# Patient Record
Sex: Male | Born: 1955 | Race: White | Hispanic: No | Smoking: Never smoker
Health system: Southern US, Community
[De-identification: ages and names within clinical notes are randomized; demographics above are authoritative.]

## PROBLEM LIST (undated history)

## (undated) DIAGNOSIS — E785 Hyperlipidemia, unspecified: Secondary | ICD-10-CM

## (undated) DIAGNOSIS — C439 Malignant melanoma of skin, unspecified: Secondary | ICD-10-CM

## (undated) DIAGNOSIS — C61 Malignant neoplasm of prostate: Secondary | ICD-10-CM

## (undated) DIAGNOSIS — I447 Left bundle-branch block, unspecified: Secondary | ICD-10-CM

## (undated) DIAGNOSIS — I10 Essential (primary) hypertension: Secondary | ICD-10-CM

## (undated) DIAGNOSIS — C799 Secondary malignant neoplasm of unspecified site: Secondary | ICD-10-CM

## (undated) DIAGNOSIS — I619 Nontraumatic intracerebral hemorrhage, unspecified: Secondary | ICD-10-CM

## (undated) DIAGNOSIS — I509 Heart failure, unspecified: Secondary | ICD-10-CM

## (undated) HISTORY — DX: Nontraumatic intracerebral hemorrhage, unspecified: I61.9

## (undated) HISTORY — PX: APPENDECTOMY: SHX54

## (undated) HISTORY — PX: PROSTATECTOMY: SHX69

## (undated) HISTORY — DX: Essential (primary) hypertension: I10

## (undated) HISTORY — DX: Heart failure, unspecified: I50.9

## (undated) HISTORY — PX: CHOLECYSTECTOMY: SHX55

## (undated) HISTORY — DX: Hyperlipidemia, unspecified: E78.5

## (undated) HISTORY — DX: Left bundle-branch block, unspecified: I44.7

---

## 1998-10-26 ENCOUNTER — Emergency Department (HOSPITAL_COMMUNITY): Admission: EM | Admit: 1998-10-26 | Discharge: 1998-10-26 | Payer: Self-pay | Admitting: *Deleted

## 1998-10-26 ENCOUNTER — Encounter: Payer: Self-pay | Admitting: *Deleted

## 1999-04-29 ENCOUNTER — Inpatient Hospital Stay (HOSPITAL_COMMUNITY): Admission: EM | Admit: 1999-04-29 | Discharge: 1999-05-01 | Payer: Self-pay | Admitting: Emergency Medicine

## 1999-04-29 ENCOUNTER — Encounter: Payer: Self-pay | Admitting: Emergency Medicine

## 1999-05-05 ENCOUNTER — Ambulatory Visit (HOSPITAL_COMMUNITY): Admission: RE | Admit: 1999-05-05 | Discharge: 1999-05-06 | Payer: Self-pay | Admitting: Surgery

## 1999-05-05 ENCOUNTER — Encounter: Payer: Self-pay | Admitting: Surgery

## 2011-12-28 ENCOUNTER — Ambulatory Visit (HOSPITAL_BASED_OUTPATIENT_CLINIC_OR_DEPARTMENT_OTHER)
Admission: RE | Admit: 2011-12-28 | Discharge: 2011-12-28 | Disposition: A | Discharge status: Home / Self Care | Payer: Self-pay | Source: Ambulatory Visit | Attending: Occupational Medicine | Admitting: Occupational Medicine

## 2011-12-28 ENCOUNTER — Other Ambulatory Visit: Payer: Self-pay | Admitting: Occupational Medicine

## 2011-12-28 DIAGNOSIS — Z0000 Encounter for general adult medical examination without abnormal findings: Secondary | ICD-10-CM | POA: Insufficient documentation

## 2011-12-28 IMAGING — CR DG CHEST 1V
1 series · 1 of 1 positions shown · non-contrast
Comparison: Chest x-ray report [REDACTED]  no images available

CLINICAL DATA: Annual physical exam

CHEST - 1 VIEW

[w chest pa]
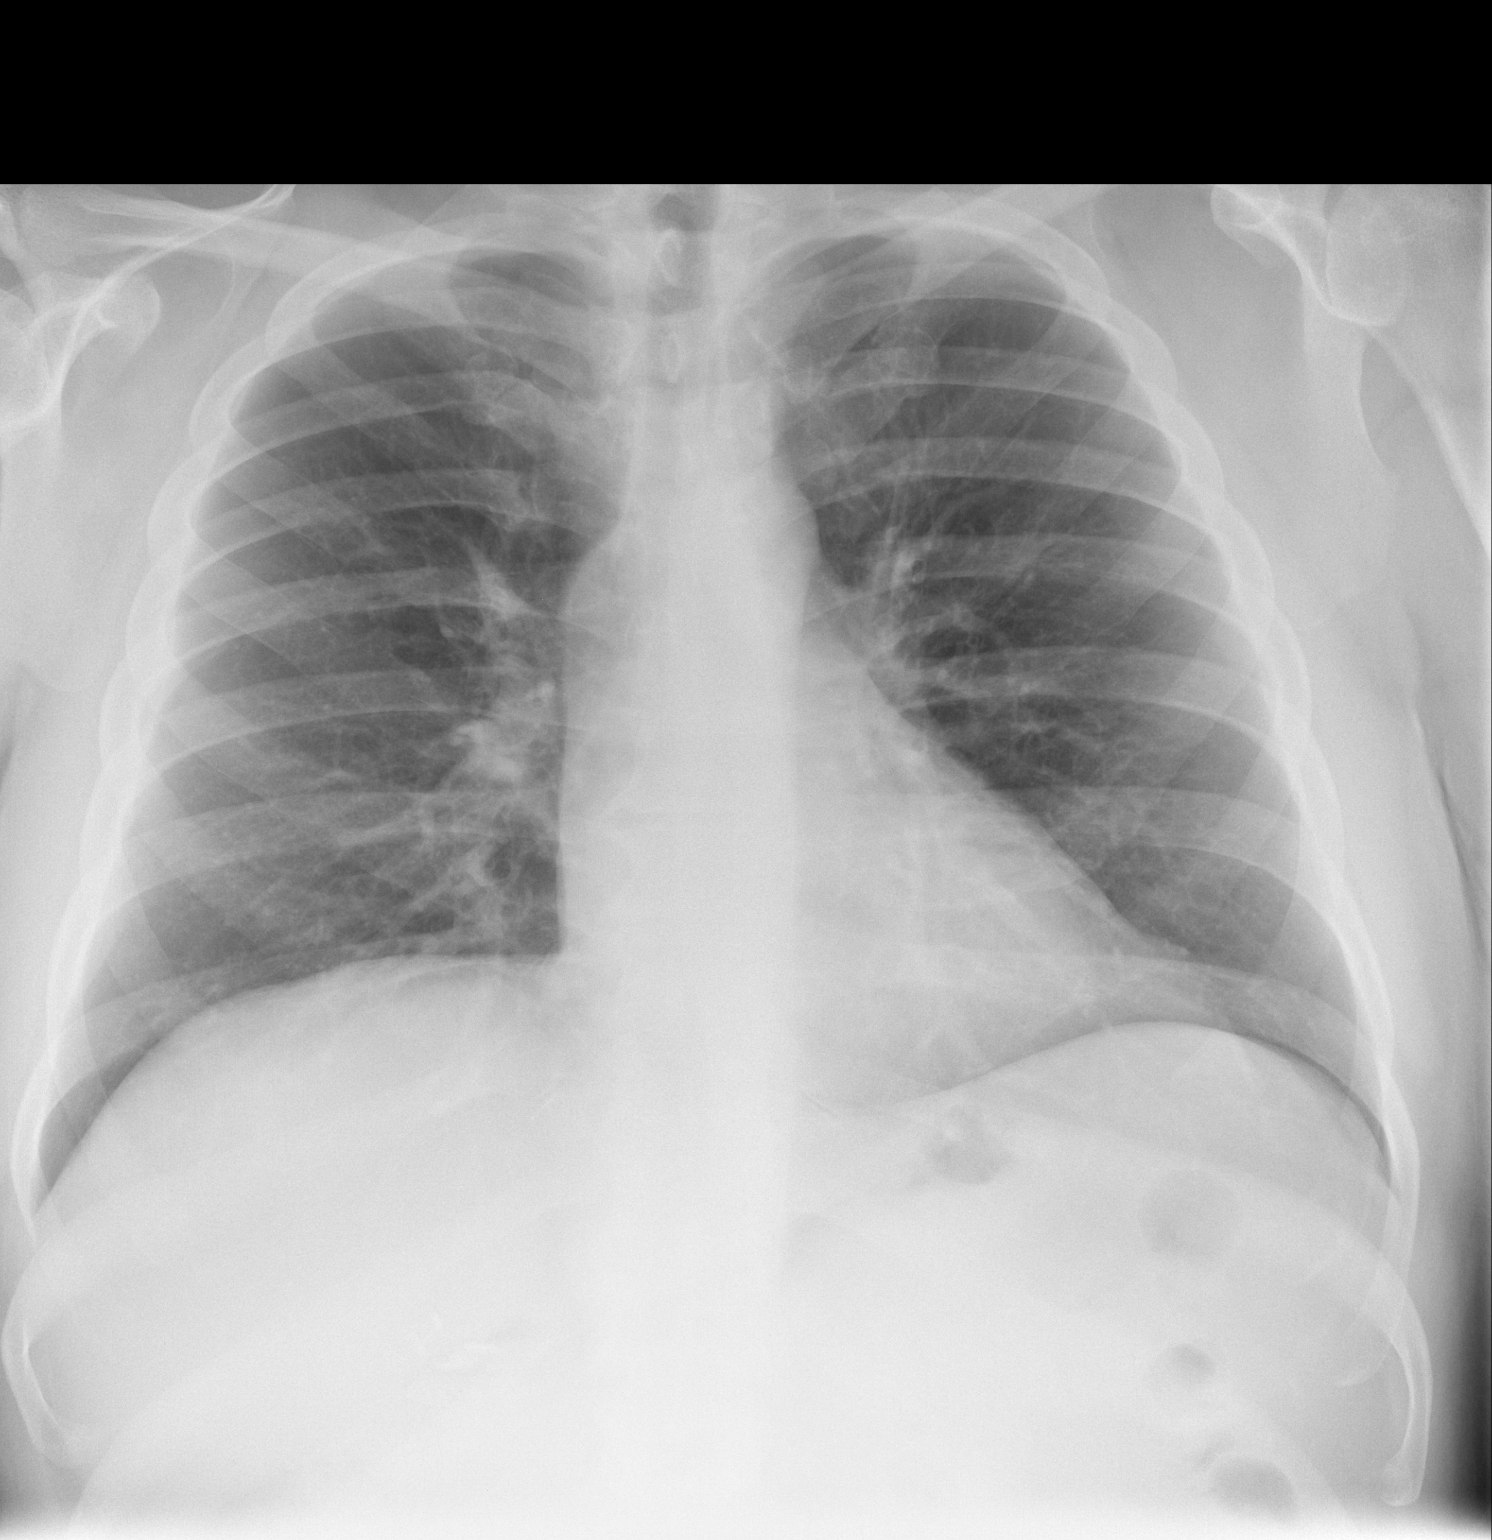

[1 of 1 positions shown; findings below may reference images not displayed]

FINDINGS: Cardiomediastinal silhouette is unremarkable.  Mild
elevation of the right hemidiaphragm.  No acute infiltrate or
pleural effusion.  No pulmonary edema. Bony thorax is unremarkable.
IMPRESSION: No active disease.

## 2019-08-05 ENCOUNTER — Encounter (HOSPITAL_BASED_OUTPATIENT_CLINIC_OR_DEPARTMENT_OTHER): Payer: Self-pay | Admitting: *Deleted

## 2019-08-05 ENCOUNTER — Emergency Department (HOSPITAL_BASED_OUTPATIENT_CLINIC_OR_DEPARTMENT_OTHER): Payer: Managed Care, Other (non HMO)

## 2019-08-05 ENCOUNTER — Emergency Department (HOSPITAL_BASED_OUTPATIENT_CLINIC_OR_DEPARTMENT_OTHER)
Admission: EM | Admit: 2019-08-05 | Discharge: 2019-08-06 | Disposition: A | Discharge status: Home / Self Care | Payer: Managed Care, Other (non HMO) | Source: Home / Self Care | Attending: Emergency Medicine | Admitting: Emergency Medicine

## 2019-08-05 ENCOUNTER — Other Ambulatory Visit: Payer: Self-pay

## 2019-08-05 DIAGNOSIS — R748 Abnormal levels of other serum enzymes: Secondary | ICD-10-CM

## 2019-08-05 DIAGNOSIS — R7989 Other specified abnormal findings of blood chemistry: Secondary | ICD-10-CM

## 2019-08-05 DIAGNOSIS — E860 Dehydration: Secondary | ICD-10-CM

## 2019-08-05 DIAGNOSIS — Z8546 Personal history of malignant neoplasm of prostate: Secondary | ICD-10-CM | POA: Insufficient documentation

## 2019-08-05 DIAGNOSIS — R109 Unspecified abdominal pain: Secondary | ICD-10-CM | POA: Insufficient documentation

## 2019-08-05 DIAGNOSIS — E876 Hypokalemia: Secondary | ICD-10-CM | POA: Insufficient documentation

## 2019-08-05 DIAGNOSIS — R945 Abnormal results of liver function studies: Secondary | ICD-10-CM | POA: Insufficient documentation

## 2019-08-05 DIAGNOSIS — R5383 Other fatigue: Secondary | ICD-10-CM | POA: Insufficient documentation

## 2019-08-05 DIAGNOSIS — R197 Diarrhea, unspecified: Secondary | ICD-10-CM

## 2019-08-05 HISTORY — DX: Malignant neoplasm of prostate: C61

## 2019-08-05 LAB — CBC WITH DIFFERENTIAL/PLATELET
Abs Immature Granulocytes: 0.05 10*3/uL (ref 0.00–0.07)
Basophils Absolute: 0 10*3/uL (ref 0.0–0.1)
Basophils Relative: 0 %
Eosinophils Absolute: 0.1 10*3/uL (ref 0.0–0.5)
Eosinophils Relative: 1 %
HCT: 48.3 % (ref 39.0–52.0)
Hemoglobin: 16.3 g/dL (ref 13.0–17.0)
Immature Granulocytes: 1 %
Lymphocytes Relative: 22 %
Lymphs Abs: 1.5 10*3/uL (ref 0.7–4.0)
MCH: 31 pg (ref 26.0–34.0)
MCHC: 33.7 g/dL (ref 30.0–36.0)
MCV: 91.8 fL (ref 80.0–100.0)
Monocytes Absolute: 0.7 10*3/uL (ref 0.1–1.0)
Monocytes Relative: 10 %
Neutro Abs: 4.5 10*3/uL (ref 1.7–7.7)
Neutrophils Relative %: 66 %
Platelets: 247 10*3/uL (ref 150–400)
RBC: 5.26 MIL/uL (ref 4.22–5.81)
RDW: 11.9 % (ref 11.5–15.5)
WBC: 6.8 10*3/uL (ref 4.0–10.5)
nRBC: 0 % (ref 0.0–0.2)

## 2019-08-05 LAB — URINALYSIS, ROUTINE W REFLEX MICROSCOPIC
Bilirubin Urine: NEGATIVE
Glucose, UA: NEGATIVE mg/dL
Ketones, ur: NEGATIVE mg/dL
Leukocytes,Ua: NEGATIVE
Nitrite: NEGATIVE
Protein, ur: 30 mg/dL — AB
Specific Gravity, Urine: 1.015 (ref 1.005–1.030)
pH: 6 (ref 5.0–8.0)

## 2019-08-05 LAB — COMPREHENSIVE METABOLIC PANEL
ALT: 210 U/L — ABNORMAL HIGH (ref 0–44)
AST: 119 U/L — ABNORMAL HIGH (ref 15–41)
Albumin: 3.2 g/dL — ABNORMAL LOW (ref 3.5–5.0)
Alkaline Phosphatase: 138 U/L — ABNORMAL HIGH (ref 38–126)
Anion gap: 11 (ref 5–15)
BUN: 34 mg/dL — ABNORMAL HIGH (ref 8–23)
CO2: 22 mmol/L (ref 22–32)
Calcium: 8.4 mg/dL — ABNORMAL LOW (ref 8.9–10.3)
Chloride: 104 mmol/L (ref 98–111)
Creatinine, Ser: 1.74 mg/dL — ABNORMAL HIGH (ref 0.61–1.24)
GFR calc Af Amer: 47 mL/min — ABNORMAL LOW (ref >60)
GFR calc non Af Amer: 41 mL/min — ABNORMAL LOW (ref >60)
Glucose, Bld: 129 mg/dL — ABNORMAL HIGH (ref 70–99)
Potassium: 3.3 mmol/L — ABNORMAL LOW (ref 3.5–5.1)
Sodium: 137 mmol/L (ref 135–145)
Total Bilirubin: 0.5 mg/dL (ref 0.3–1.2)
Total Protein: 6.8 g/dL (ref 6.5–8.1)

## 2019-08-05 LAB — URINALYSIS, MICROSCOPIC (REFLEX)

## 2019-08-05 LAB — LIPASE, BLOOD: Lipase: 186 U/L — ABNORMAL HIGH (ref 11–51)

## 2019-08-05 IMAGING — CT CT ABD-PELV W/ CM
2 of 5 series · 16 of 46 positions shown, 18 images · IV contrast (Omnipaque)
Comparison: None.

CLINICAL DATA: Diarrhea. Elevated liver enzymes and pancreatic
enzymes.

EXAM:
CT ABDOMEN AND PELVIS WITH CONTRAST
TECHNIQUE: Multidetector CT imaging of the abdomen and pelvis was performed
using the standard protocol following bolus administration of
intravenous contrast.
CONTRAST:  80mL OMNIPAQUE IOHEXOL 300 MG/ML  SOLN

[Series 2: axial st · axial · 0.94mm/px · z∈[-536,-76]mm · 13 of 104 slices shown, 15 images]
[im 6/104  soft-tissue]
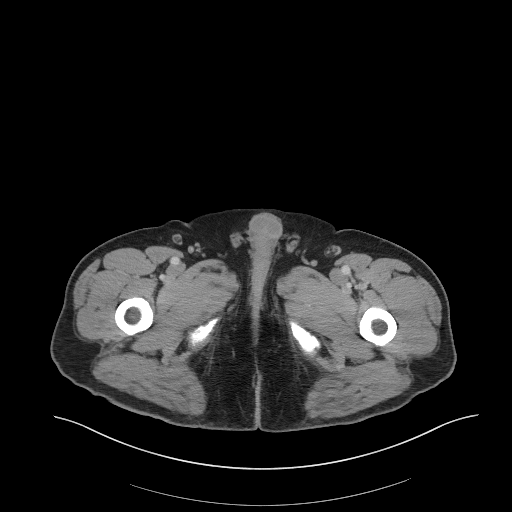
[im 6/104  bone]
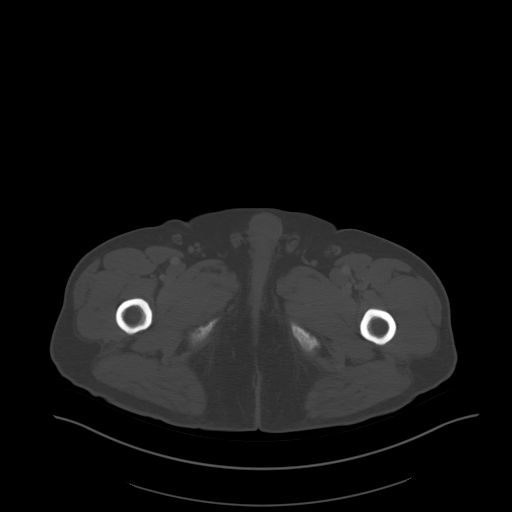
[im 17/104  soft-tissue]
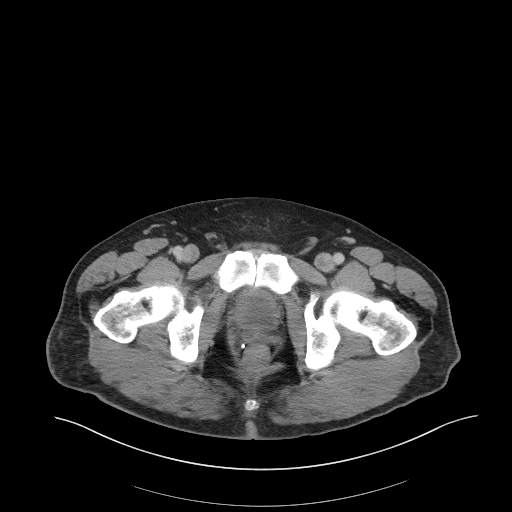
[im 22/104  soft-tissue]
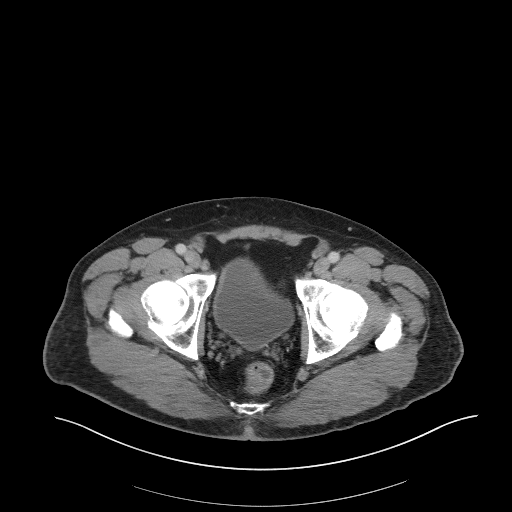
[im 28/104  soft-tissue]
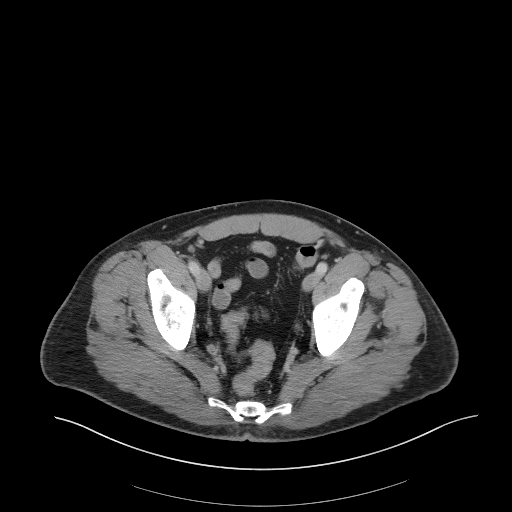
[im 38/104  soft-tissue]
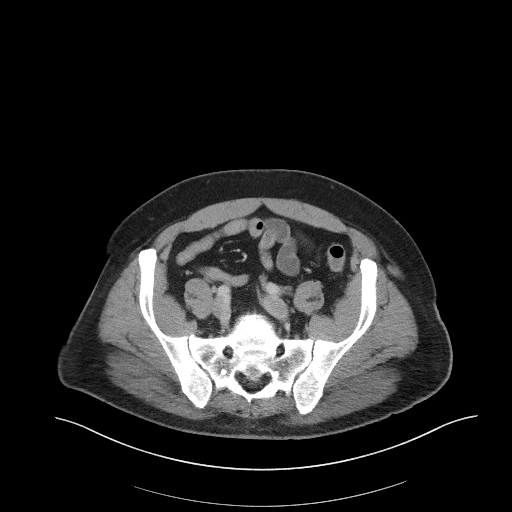
[im 44/104  soft-tissue]
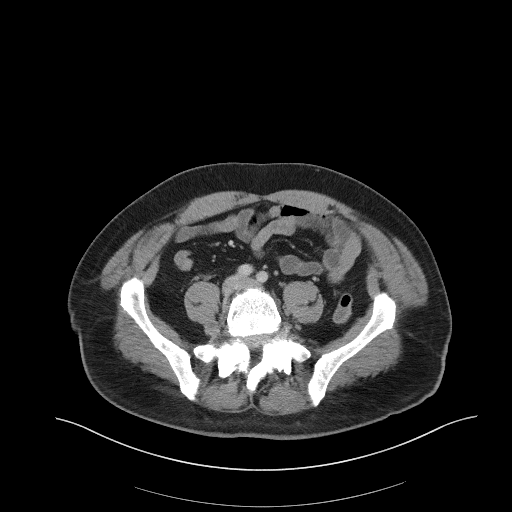
[im 55/104  soft-tissue]
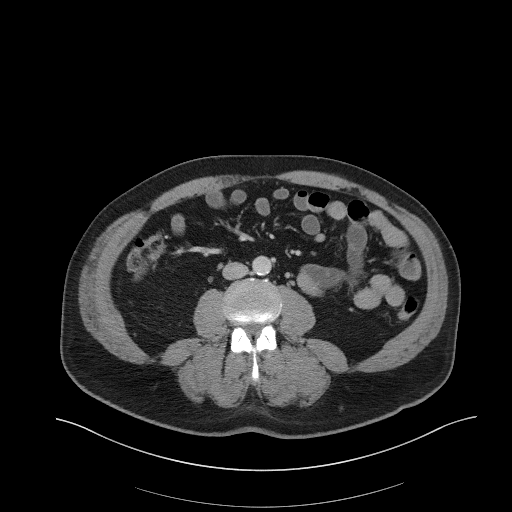
[im 60/104  soft-tissue]
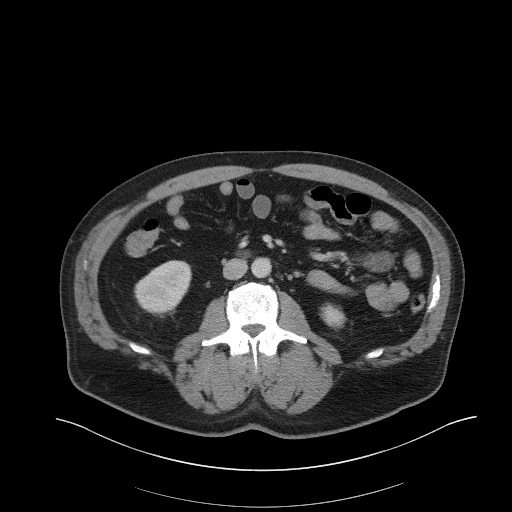
[im 66/104  soft-tissue]
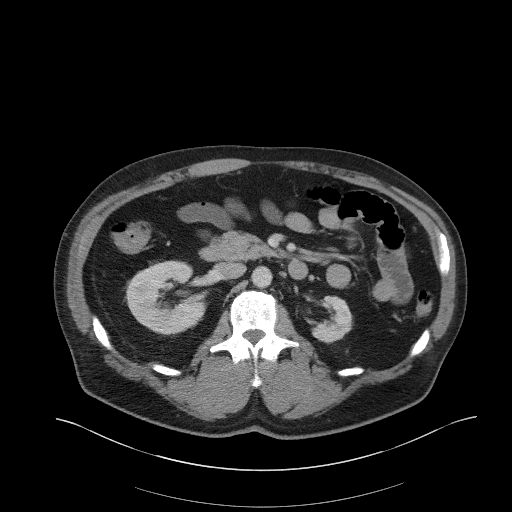
[im 66/104  bone]
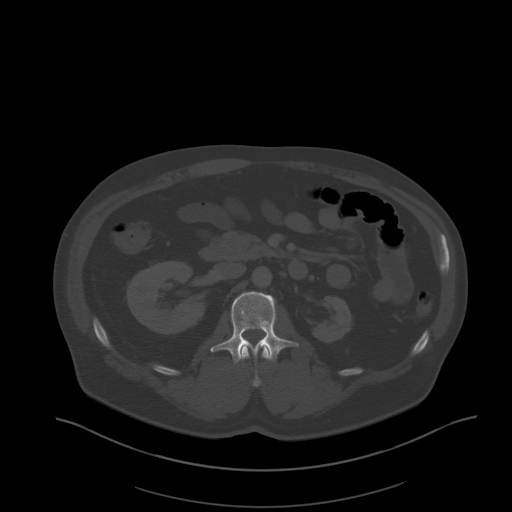
[im 76/104  soft-tissue]
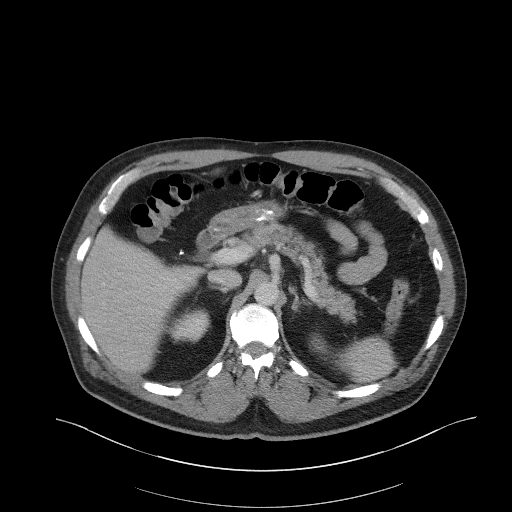
[im 82/104  soft-tissue]
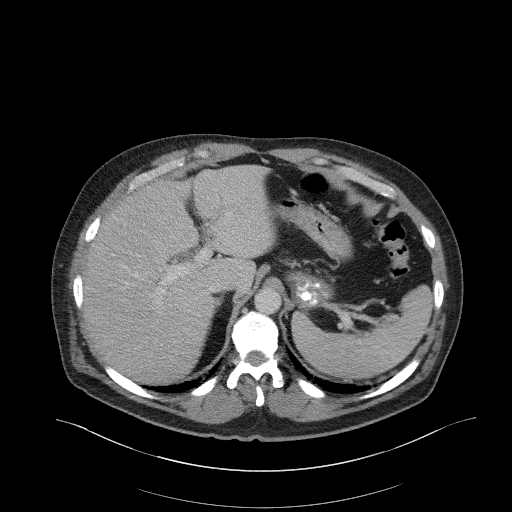
[im 87/104  soft-tissue]
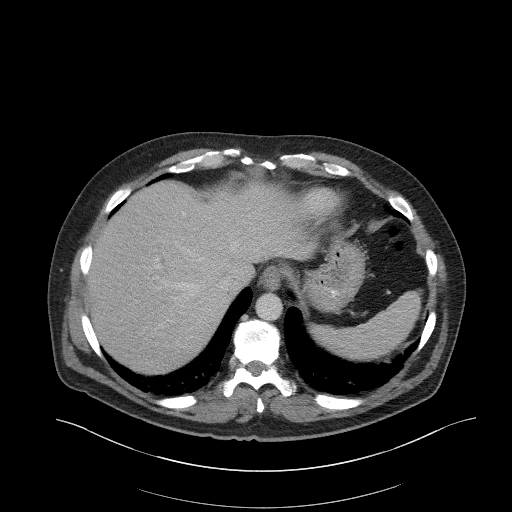
[im 98/104  soft-tissue]
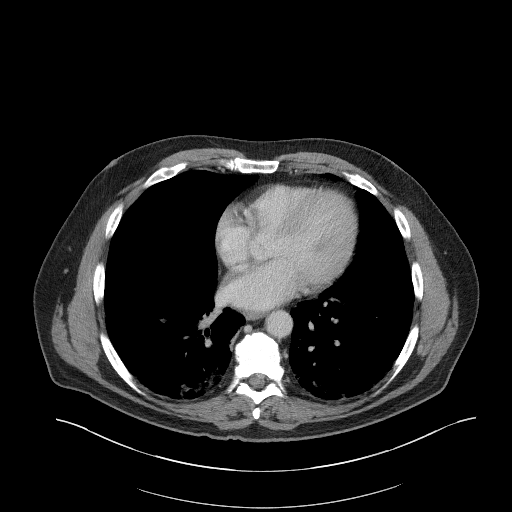

[Series 5: coronal st · coronal · 0.88mm/px · 3 of 101 slices shown]
[im 34/101  soft-tissue]
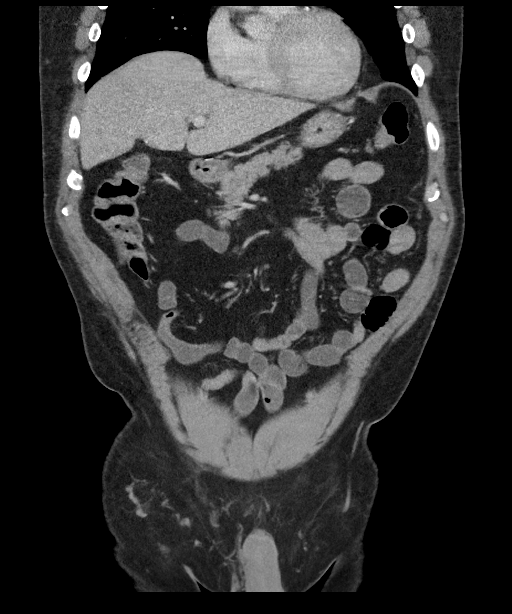
[im 45/101  soft-tissue]
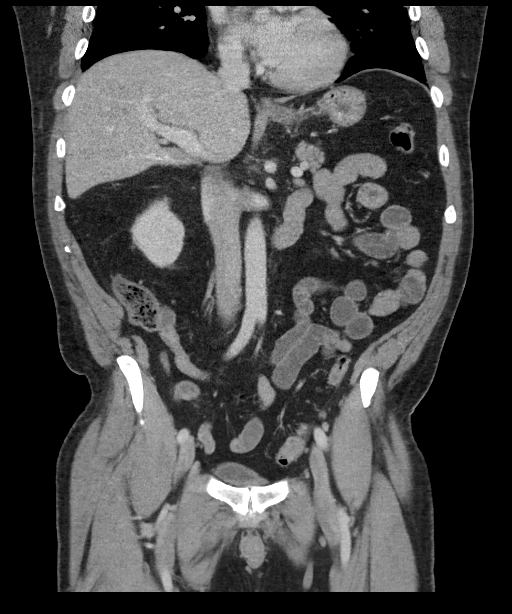
[im 56/101  soft-tissue]
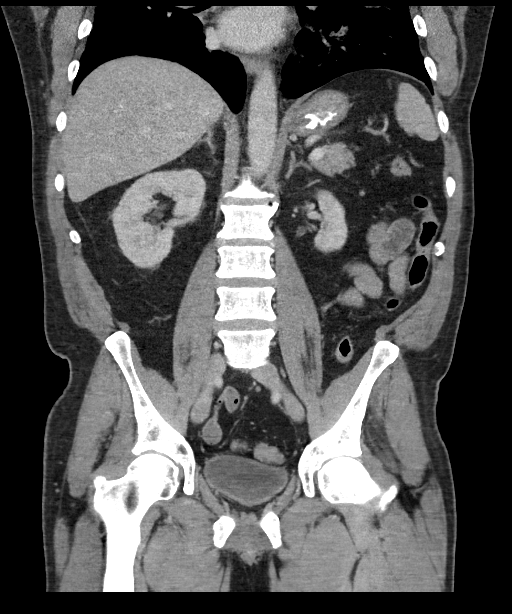

[16 of 46 positions shown; findings below may reference images not displayed]

FINDINGS: Lower chest: Patchy ground-glass airspace opacities dependently in
the lower lobes as well as in the posterior lingula. Cannot exclude
pneumonia or COVID pneumonia. No effusions. Heart is normal size.

Hepatobiliary: Mild diffuse fatty infiltration of the liver. No
focal hepatic abnormality. Prior cholecystectomy.

Pancreas: No focal abnormality or ductal dilatation.

Spleen: No focal abnormality.  Normal size.

Adrenals/Urinary Tract: Atrophic left kidney. No renal or adrenal
mass. No hydronephrosis or stones. Urinary bladder unremarkable.

Stomach/Bowel: Mildly prominent small bowel loops in the left upper
quadrant. Other small bowel loops are fluid-filled. No well-defined
focal transition zone. I favor this represents mild focal ileus.
Stomach and large bowel unremarkable.

Vascular/Lymphatic: No evidence of aneurysm or adenopathy. Scattered
aortic atherosclerosis.

Reproductive: No visible focal abnormality.

Other: No free fluid or free air.

Musculoskeletal: No acute bony abnormality. Degenerative facet
disease in the lower lumbar spine. Associated slight anterolisthesis
of L5 on S1.
IMPRESSION: Mild diffuse fatty infiltration of the liver.

Few prominent small bowel loops in the left upper quadrant, favor
mild focal ileus rather than bowel obstruction.

Aortic atherosclerosis.

Prior cholecystectomy.

Mildly atrophic left kidney.

## 2019-08-05 MED ORDER — SODIUM CHLORIDE 0.9 % IV BOLUS
1000.0000 mL | Freq: Once | INTRAVENOUS | Status: AC
  Administered 2019-08-05: 1000 mL via INTRAVENOUS

## 2019-08-05 MED ORDER — IOHEXOL 300 MG/ML  SOLN
100.0000 mL | Freq: Once | INTRAMUSCULAR | Status: AC | PRN
  Administered 2019-08-05: 23:00:00 80 mL via INTRAVENOUS

## 2019-08-05 MED ORDER — POTASSIUM CHLORIDE CRYS ER 20 MEQ PO TBCR
40.0000 meq | EXTENDED_RELEASE_TABLET | Freq: Once | ORAL | Status: AC
  Administered 2019-08-05: 40 meq via ORAL
  Filled 2019-08-05: qty 2

## 2019-08-05 NOTE — ED Triage Notes (Signed)
Pt reports dx with Covid earlier this month- his quarantine was supposed to end today. Reports he has had diarrhea since being dx and has lost 25 lb

## 2019-08-05 NOTE — ED Provider Notes (Signed)
[place] [facility] DEPART[facility] Provider Note   CSN: [id number] Arrival date & time: 08/05/19  1957     History Chief Complaint  Patient presents with  . Diarrhea    Logan Harrison is a 64 y.o. male.  Logan Harrison is a 64 y.o. male with a history of prostate cancer, otherwise healthy, who was diagnosed with Covid infection on 4/7, presenting today for evaluation of persistent diarrhea.  Patient states since Covid began he has been having at least 3 bowel movements daily.  His quarantine for Covid was supposed to and yesterday but he continues to have diarrhea.  He states that he has not noticed any blood in his stool, did notice a tiny bit of blood on the toilet paper just today.  He has not been on any recent antibiotics.  He states aside from this he has had fairly mild Covid symptoms, reports an occasional cough but this pretty much resolved, he denies any chest pain or shortness of breath.  Reports with diarrhea he occasionally has some cramping abdominal discomfort when he has to have a bowel movement but denies any persistent abdominal pain.  No nausea or vomiting but does report decreased appetite and some fatigue.  He has been taking Imodium, but does not feel that it helps much.  He has not had any fevers or chills.  No other aggravating or alleviating factors.        Past Medical History:  Diagnosis Date  . Prostate cancer ([place])     There are no problems to display for this patient.   Past Surgical History:  Procedure Laterality Date  . CHOLECYSTECTOMY    . PROSTATECTOMY         No family history on file.  Social History   Tobacco Use  . Smoking status: Never Smoker  . Smokeless tobacco: Never Used  Substance Use Topics  . Alcohol use: Yes  . Drug use: Never    Home Medications Prior to Admission medications   Not on File    Allergies    Patient has no known allergies.  Review of Systems   Review of Systems  Constitutional:  Positive for fatigue. Negative for chills and fever.  HENT: Negative.   Respiratory: Negative for cough and shortness of breath.   Cardiovascular: Negative for chest pain.  Gastrointestinal: Positive for abdominal pain and diarrhea. Negative for nausea and vomiting.  Genitourinary: Negative for dysuria and frequency.  Musculoskeletal: Negative for arthralgias and myalgias.  Skin: Negative for color change and rash.  Neurological: Negative for headaches.  All other systems reviewed and are negative.   Physical Exam Updated Vital Signs BP 107/75 (BP Location: Left Arm)   Pulse (!) 109   Temp 98.1 F (36.7 C) (Oral)   Resp (!) 24   Ht '5\' 10"'$  (1.778 m)   Wt 86.2 kg   SpO2 96%   BMI 27.26 kg/m   Physical Exam Vitals and nursing note reviewed.  Constitutional:      General: He is not in acute distress.    Appearance: Normal appearance. He is well-developed and normal weight. He is not ill-appearing or diaphoretic.     Comments: Well-appearing and in no distress  HENT:     Head: Normocephalic and atraumatic.     Mouth/Throat:     Mouth: Mucous membranes are moist.     Pharynx: Oropharynx is clear.  Eyes:     General:        Right eye:  No discharge.        Left eye: No discharge.     Pupils: Pupils are equal, round, and reactive to light.  Cardiovascular:     Rate and Rhythm: Regular rhythm. Tachycardia present.     Heart sounds: Normal heart sounds. No murmur. No friction rub. No gallop.      Comments: Mild tachycardia with regular rhythm Pulmonary:     Effort: Pulmonary effort is normal. No respiratory distress.     Breath sounds: Normal breath sounds. No wheezing or rales.     Comments: Respirations equal and unlabored, patient able to speak in full sentences, lungs clear to auscultation bilaterally Abdominal:     General: Bowel sounds are normal. There is no distension.     Palpations: Abdomen is soft. There is no mass.     Tenderness: There is no abdominal tenderness.  There is no guarding.     Comments: Abdomen soft, nondistended, nontender to palpation in all quadrants without guarding or peritoneal signs  Musculoskeletal:        General: No deformity.     Cervical back: Neck supple.  Skin:    General: Skin is warm and dry.     Capillary Refill: Capillary refill takes less than 2 seconds.  Neurological:     Mental Status: He is alert.     Coordination: Coordination normal.     Comments: Speech is clear, able to follow commands Moves extremities without ataxia, coordination intact  Psychiatric:        Mood and Affect: Mood normal.        Behavior: Behavior normal.     ED Results / Procedures / Treatments   Labs (all labs ordered are listed, but only abnormal results are displayed) Labs Reviewed  COMPREHENSIVE METABOLIC PANEL - Abnormal; Notable for the following components:      Result Value   Potassium 3.3 (*)    Glucose, Bld 129 (*)    BUN 34 (*)    Creatinine, Ser 1.74 (*)    Calcium 8.4 (*)    Albumin 3.2 (*)    AST 119 (*)    ALT 210 (*)    Alkaline Phosphatase 138 (*)    GFR calc non Af Amer 41 (*)    GFR calc Af Amer 47 (*)    All other components within normal limits  LIPASE, BLOOD - Abnormal; Notable for the following components:   Lipase 186 (*)    All other components within normal limits  URINALYSIS, ROUTINE W REFLEX MICROSCOPIC - Abnormal; Notable for the following components:   Hgb urine dipstick TRACE (*)    Protein, ur 30 (*)    All other components within normal limits  URINALYSIS, MICROSCOPIC (REFLEX) - Abnormal; Notable for the following components:   Bacteria, UA FEW (*)    All other components within normal limits  CBC WITH DIFFERENTIAL/PLATELET    EKG None  Radiology CT ABDOMEN PELVIS W CONTRAST  Result Date: 08/05/2019 CLINICAL DATA:  Diarrhea. Elevated liver enzymes and pancreatic enzymes. EXAM: CT ABDOMEN AND PELVIS WITH CONTRAST TECHNIQUE: Multidetector CT imaging of the abdomen and pelvis was  performed using the standard protocol following bolus administration of intravenous contrast. CONTRAST:  63m OMNIPAQUE IOHEXOL 300 MG/ML  SOLN COMPARISON:  None. FINDINGS: Lower chest: Patchy ground-glass airspace opacities dependently in the lower lobes as well as in the posterior lingula. Cannot exclude pneumonia or COVID pneumonia. No effusions. Heart is normal size. Hepatobiliary: Mild diffuse fatty infiltration of the  liver. No focal hepatic abnormality. Prior cholecystectomy. Pancreas: No focal abnormality or ductal dilatation. Spleen: No focal abnormality.  Normal size. Adrenals/Urinary Tract: Atrophic left kidney. No renal or adrenal mass. No hydronephrosis or stones. Urinary bladder unremarkable. Stomach/Bowel: Mildly prominent small bowel loops in the left upper quadrant. Other small bowel loops are fluid-filled. No well-defined focal transition zone. I favor this represents mild focal ileus. Stomach and large bowel unremarkable. Vascular/Lymphatic: No evidence of aneurysm or adenopathy. Scattered aortic atherosclerosis. Reproductive: No visible focal abnormality. Other: No free fluid or free air. Musculoskeletal: No acute bony abnormality. Degenerative facet disease in the lower lumbar spine. Associated slight anterolisthesis of L5 on S1. IMPRESSION: Mild diffuse fatty infiltration of the liver. Few prominent small bowel loops in the left upper quadrant, favor mild focal ileus rather than bowel obstruction. Aortic atherosclerosis. Prior cholecystectomy. Mildly atrophic left kidney. Electronically Signed   By: Rolm Baptise M.D.   On: 08/05/2019 22:58    Procedures Procedures (including critical care time)  Medications Ordered in ED Medications  sodium chloride 0.9 % bolus 1,000 mL (0 mLs Intravenous Stopped 08/05/19 2232)  potassium chloride SA (KLOR-CON) CR tablet 40 mEq (40 mEq Oral Given 08/05/19 2206)  iohexol (OMNIPAQUE) 300 MG/ML solution 100 mL (80 mLs Intravenous Contrast Given 08/05/19  2234)    ED Course  I have reviewed the triage vital signs and the nursing notes.  Pertinent labs & imaging results that were available during my care of the patient were reviewed by me and considered in my medical decision making (see chart for details).    MDM Rules/Calculators/A&P                     64 year old male diagnosed with Covid 2 weeks ago presenting with persistent diarrhea, having 3 loose bowel movements daily, aside from diarrhea Covid course has been fairly mild.  Occasional cough.  But he denies any chest pain or shortness of breath.  Satting at 96% on room air with normal work of breathing.  He is mildly tachycardic on arrival but is afebrile, this could certainly be in the setting of dehydration with diarrhea.  His abdomen is completely nontender.  Will check basic labs, and give IV fluids.  I have ordered, reviewed and interpreted all lab work. CBC: No leukocytosis, normal hemoglobin CMP: Mild hypokalemia of 3.3, likely in the setting of diarrhea, p.o. potassium replacement given.  Creatinine of 1.74, chart review shows that through [facility] creatinine has previously been between 1.3-1.4 so this is a slight but not significant increase, suspect this is in the setting of dehydration from diarrhea.  AST, ALT and alk phos are elevated, but normal T bili.  Patient has had some previously elevated liver enzymes, could also be in the setting of Covid infection. Lipase: Mildly elevated at 186.  Given abnormal lipase and LFTs will get CT abdomen pelvis to evaluate for any potential biliary blockage or inflammation. UA: Trace hemoglobin, no signs of infection.  CT abdomen pelvis: Mild diffuse fatty liver infiltration, likely the cause of patient's elevated LFTs.  Prominent small bowel loops noted, favored to be a mild focal ileus rather than bowel obstruction, patient is not having any issues having bowel movements and his nausea vomiting.  He has no tenderness on exam.  CT also  notes some groundglass opacities in bilateral lung bases, patient has known Covid infection, but he has no shortness of breath, chest pain and has normal O2 sats on room air she do  not feel that this requires further evaluation at this time.  Went in to reassess patient and after IV fluids he states he is feeling better.  I have stressed to the patient the importance of having his LFTs and lipase, as well as his creatinine rechecked within 1 week.  And given the patient strict return precautions.  I have given him information on diet modifications and asked him to begin taking a fiber and probiotic supplement to help with diarrhea.  He expresses understanding and agreement with this plan.  Discharged home in good condition.  Case discussed with Dr. Roslynn Amble who was available to help guide patient's care, is in agreement with evaluation and plan.  Final Clinical Impression(s) / ED Diagnoses Final diagnoses:  Diarrhea, unspecified type  Dehydration  Elevated LFTs  Elevated lipase  Hypokalemia    Rx / DC Orders ED Discharge Orders    None       Janet Berlin 08/09/19 1339    Lucrezia Starch, MD 08/09/19 1510

## 2019-08-05 NOTE — Discharge Instructions (Addendum)
Your lab work shows some elevation of your liver and pancreatic enzymes.  These will need to be rechecked in 1 week.  Your kidney function is also slightly elevated and I suspect this is due to dehydration.  Please make sure you are drinking plenty of fluids daily.  Use the instructions provided to help with dietary modifications to help with diarrhea.  I would also like for you to start taking a daily fiber supplement and probiotic supplement, these are both available over-the-counter and can help manage your diarrhea.  Return to the emergency department if you develop vomiting, abdominal pain, worsening diarrhea, blood in your stools or any other new or concerning symptoms.

## 2019-08-08 ENCOUNTER — Other Ambulatory Visit: Payer: Self-pay

## 2019-08-08 ENCOUNTER — Inpatient Hospital Stay (HOSPITAL_BASED_OUTPATIENT_CLINIC_OR_DEPARTMENT_OTHER)
Admission: EM | Admit: 2019-08-08 | Discharge: 2019-08-13 | DRG: 177 | Disposition: A | Discharge status: Home / Self Care | Payer: Managed Care, Other (non HMO) | Attending: Internal Medicine | Admitting: Internal Medicine

## 2019-08-08 ENCOUNTER — Encounter (HOSPITAL_BASED_OUTPATIENT_CLINIC_OR_DEPARTMENT_OTHER): Payer: Self-pay

## 2019-08-08 ENCOUNTER — Emergency Department (HOSPITAL_BASED_OUTPATIENT_CLINIC_OR_DEPARTMENT_OTHER): Payer: Managed Care, Other (non HMO)

## 2019-08-08 DIAGNOSIS — Z9079 Acquired absence of other genital organ(s): Secondary | ICD-10-CM | POA: Diagnosis not present

## 2019-08-08 DIAGNOSIS — U071 COVID-19: Secondary | ICD-10-CM

## 2019-08-08 DIAGNOSIS — I447 Left bundle-branch block, unspecified: Secondary | ICD-10-CM | POA: Diagnosis present

## 2019-08-08 DIAGNOSIS — R7401 Elevation of levels of liver transaminase levels: Secondary | ICD-10-CM

## 2019-08-08 DIAGNOSIS — Z833 Family history of diabetes mellitus: Secondary | ICD-10-CM

## 2019-08-08 DIAGNOSIS — I5022 Chronic systolic (congestive) heart failure: Secondary | ICD-10-CM | POA: Diagnosis present

## 2019-08-08 DIAGNOSIS — N179 Acute kidney failure, unspecified: Secondary | ICD-10-CM | POA: Diagnosis present

## 2019-08-08 DIAGNOSIS — I2699 Other pulmonary embolism without acute cor pulmonale: Secondary | ICD-10-CM | POA: Diagnosis present

## 2019-08-08 DIAGNOSIS — E876 Hypokalemia: Secondary | ICD-10-CM | POA: Diagnosis present

## 2019-08-08 DIAGNOSIS — J9600 Acute respiratory failure, unspecified whether with hypoxia or hypercapnia: Secondary | ICD-10-CM

## 2019-08-08 DIAGNOSIS — T380X5A Adverse effect of glucocorticoids and synthetic analogues, initial encounter: Secondary | ICD-10-CM | POA: Diagnosis present

## 2019-08-08 DIAGNOSIS — N183 Chronic kidney disease, stage 3 unspecified: Secondary | ICD-10-CM | POA: Diagnosis present

## 2019-08-08 DIAGNOSIS — R197 Diarrhea, unspecified: Secondary | ICD-10-CM | POA: Diagnosis present

## 2019-08-08 DIAGNOSIS — D72829 Elevated white blood cell count, unspecified: Secondary | ICD-10-CM | POA: Diagnosis present

## 2019-08-08 DIAGNOSIS — I2609 Other pulmonary embolism with acute cor pulmonale: Secondary | ICD-10-CM

## 2019-08-08 DIAGNOSIS — J1282 Pneumonia due to coronavirus disease 2019: Secondary | ICD-10-CM

## 2019-08-08 DIAGNOSIS — Z8546 Personal history of malignant neoplasm of prostate: Secondary | ICD-10-CM

## 2019-08-08 DIAGNOSIS — R17 Unspecified jaundice: Secondary | ICD-10-CM | POA: Diagnosis not present

## 2019-08-08 DIAGNOSIS — J9601 Acute respiratory failure with hypoxia: Secondary | ICD-10-CM | POA: Diagnosis present

## 2019-08-08 DIAGNOSIS — I82442 Acute embolism and thrombosis of left tibial vein: Secondary | ICD-10-CM | POA: Diagnosis present

## 2019-08-08 DIAGNOSIS — Z8249 Family history of ischemic heart disease and other diseases of the circulatory system: Secondary | ICD-10-CM | POA: Diagnosis not present

## 2019-08-08 DIAGNOSIS — E860 Dehydration: Secondary | ICD-10-CM | POA: Diagnosis present

## 2019-08-08 LAB — CBC WITH DIFFERENTIAL/PLATELET
Abs Immature Granulocytes: 0.09 10*3/uL — ABNORMAL HIGH (ref 0.00–0.07)
Basophils Absolute: 0 10*3/uL (ref 0.0–0.1)
Basophils Relative: 0 %
Eosinophils Absolute: 0 10*3/uL (ref 0.0–0.5)
Eosinophils Relative: 0 %
HCT: 43.7 % (ref 39.0–52.0)
Hemoglobin: 15.1 g/dL (ref 13.0–17.0)
Immature Granulocytes: 1 %
Lymphocytes Relative: 9 %
Lymphs Abs: 1 10*3/uL (ref 0.7–4.0)
MCH: 31.5 pg (ref 26.0–34.0)
MCHC: 34.6 g/dL (ref 30.0–36.0)
MCV: 91.2 fL (ref 80.0–100.0)
Monocytes Absolute: 1.4 10*3/uL — ABNORMAL HIGH (ref 0.1–1.0)
Monocytes Relative: 12 %
Neutro Abs: 9.2 10*3/uL — ABNORMAL HIGH (ref 1.7–7.7)
Neutrophils Relative %: 78 %
Platelets: 314 10*3/uL (ref 150–400)
RBC: 4.79 MIL/uL (ref 4.22–5.81)
RDW: 11.8 % (ref 11.5–15.5)
WBC: 11.8 10*3/uL — ABNORMAL HIGH (ref 4.0–10.5)
nRBC: 0 % (ref 0.0–0.2)

## 2019-08-08 LAB — LACTIC ACID, PLASMA
Lactic Acid, Venous: 1.1 mmol/L (ref 0.5–1.9)
Lactic Acid, Venous: 1.2 mmol/L (ref 0.5–1.9)

## 2019-08-08 LAB — TROPONIN I (HIGH SENSITIVITY)
Troponin I (High Sensitivity): 13 ng/L (ref <18)
Troponin I (High Sensitivity): 15 ng/L (ref <18)

## 2019-08-08 LAB — RESPIRATORY PANEL BY RT PCR (FLU A&B, COVID)
Influenza A by PCR: NEGATIVE
Influenza B by PCR: NEGATIVE
SARS Coronavirus 2 by RT PCR: POSITIVE — AB

## 2019-08-08 LAB — COMPREHENSIVE METABOLIC PANEL
ALT: 134 U/L — ABNORMAL HIGH (ref 0–44)
AST: 64 U/L — ABNORMAL HIGH (ref 15–41)
Albumin: 3.1 g/dL — ABNORMAL LOW (ref 3.5–5.0)
Alkaline Phosphatase: 146 U/L — ABNORMAL HIGH (ref 38–126)
Anion gap: 12 (ref 5–15)
BUN: 18 mg/dL (ref 8–23)
CO2: 18 mmol/L — ABNORMAL LOW (ref 22–32)
Calcium: 8.7 mg/dL — ABNORMAL LOW (ref 8.9–10.3)
Chloride: 103 mmol/L (ref 98–111)
Creatinine, Ser: 1.27 mg/dL — ABNORMAL HIGH (ref 0.61–1.24)
GFR calc Af Amer: >60 mL/min (ref >60)
GFR calc non Af Amer: 60 mL/min — ABNORMAL LOW (ref >60)
Glucose, Bld: 126 mg/dL — ABNORMAL HIGH (ref 70–99)
Potassium: 4 mmol/L (ref 3.5–5.1)
Sodium: 133 mmol/L — ABNORMAL LOW (ref 135–145)
Total Bilirubin: 2.2 mg/dL — ABNORMAL HIGH (ref 0.3–1.2)
Total Protein: 7.3 g/dL (ref 6.5–8.1)

## 2019-08-08 LAB — C-REACTIVE PROTEIN: CRP: 18.1 mg/dL — ABNORMAL HIGH (ref <1.0)

## 2019-08-08 LAB — D-DIMER, QUANTITATIVE: D-Dimer, Quant: >20 ug/mL-FEU — ABNORMAL HIGH (ref 0.00–0.50)

## 2019-08-08 LAB — PROTIME-INR
INR: 1.1 (ref 0.8–1.2)
Prothrombin Time: 14.4 seconds (ref 11.4–15.2)

## 2019-08-08 LAB — FERRITIN: Ferritin: 1937 ng/mL — ABNORMAL HIGH (ref 24–336)

## 2019-08-08 LAB — FIBRINOGEN: Fibrinogen: 772 mg/dL — ABNORMAL HIGH (ref 210–475)

## 2019-08-08 LAB — TRIGLYCERIDES: Triglycerides: 143 mg/dL (ref <150)

## 2019-08-08 LAB — LACTATE DEHYDROGENASE: LDH: 355 U/L — ABNORMAL HIGH (ref 98–192)

## 2019-08-08 LAB — PROCALCITONIN: Procalcitonin: 0.17 ng/mL

## 2019-08-08 LAB — BRAIN NATRIURETIC PEPTIDE: B Natriuretic Peptide: 109.9 pg/mL — ABNORMAL HIGH (ref 0.0–100.0)

## 2019-08-08 IMAGING — CT CT ANGIO CHEST
2 of 8 series · 18 of 36 positions shown · IV contrast (omnipaque)
Comparison: Chest radiograph dated [DATE] and CT of the abdomen
pelvis dated [DATE].

CLINICAL DATA: 63-year-old male with chest pain and shortness of
breath. Recent positive [QU] on [DATE].

EXAM:
CT ANGIOGRAPHY CHEST WITH CONTRAST
TECHNIQUE: Multidetector CT imaging of the chest was performed using the
standard protocol during bolus administration of intravenous
contrast. Multiplanar CT image reconstructions and MIPs were
obtained to evaluate the vascular anatomy.
CONTRAST:  100mL OMNIPAQUE IOHEXOL 350 MG/ML SOLN

[Series 6: pe coronal mpr · coronal · 0.59mm/px · 1 of 149 slices shown]
[im 75/149  mediastinal]
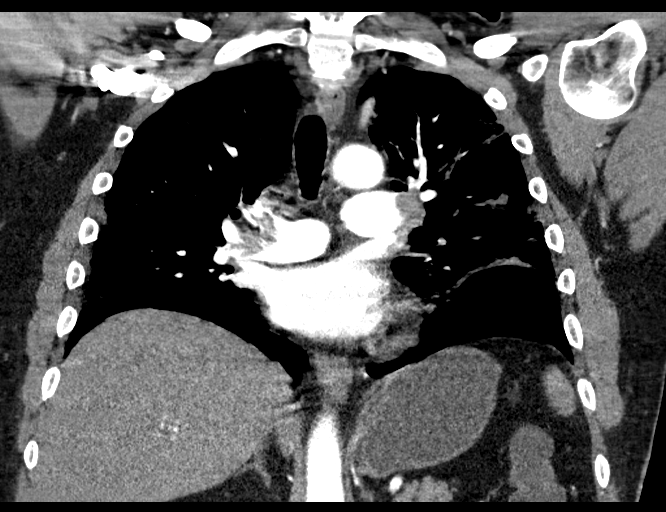

[Series 10: pe thins · axial · 0.76mm/px · z∈[-188,+67]mm · 17 of 287 slices shown]
[im 16/287  lung]
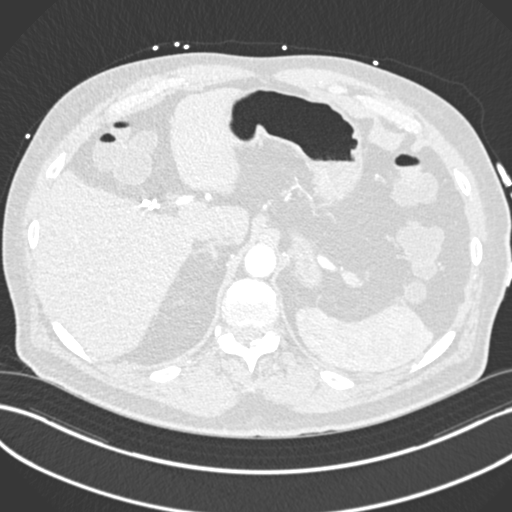
[im 31/287  mediastinal]
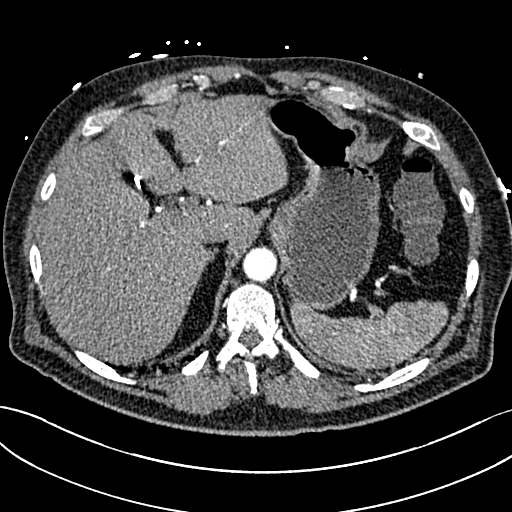
[im 46/287  lung]
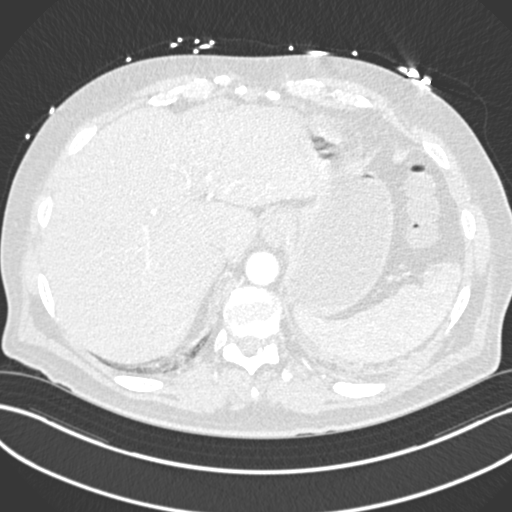
[im 61/287  mediastinal]
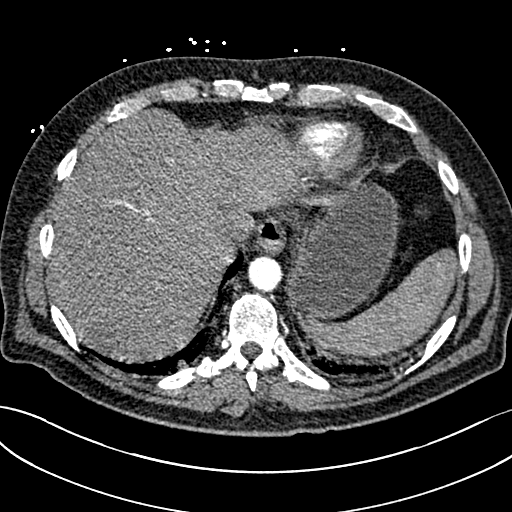
[im 76/287  lung]
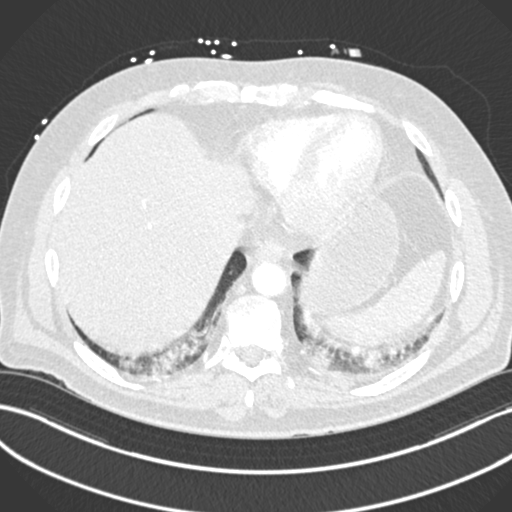
[im 91/287  mediastinal]
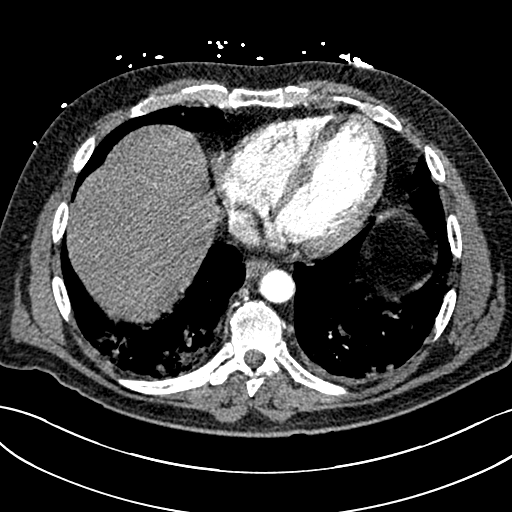
[im 106/287  lung]
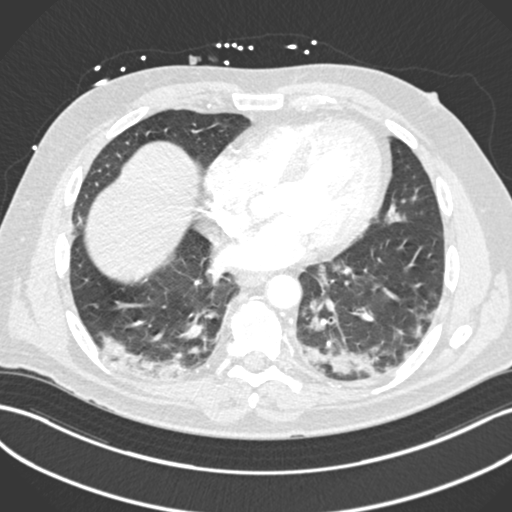
[im 121/287  mediastinal]
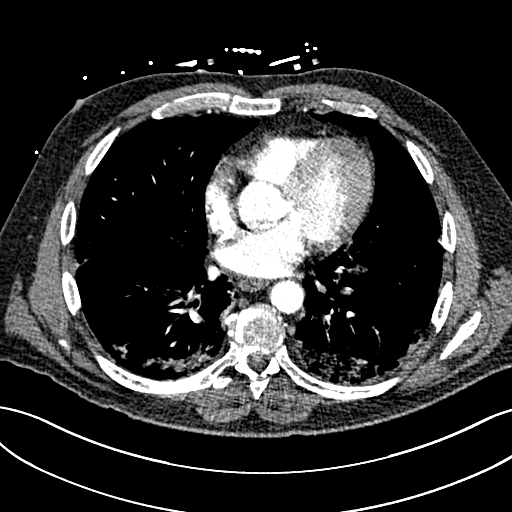
[im 151/287  lung]
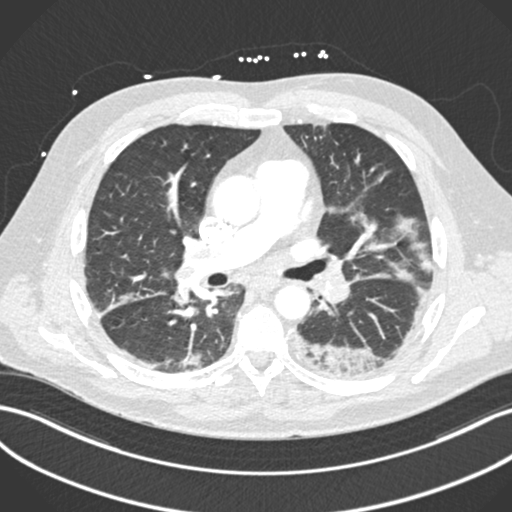
[im 166/287  mediastinal]
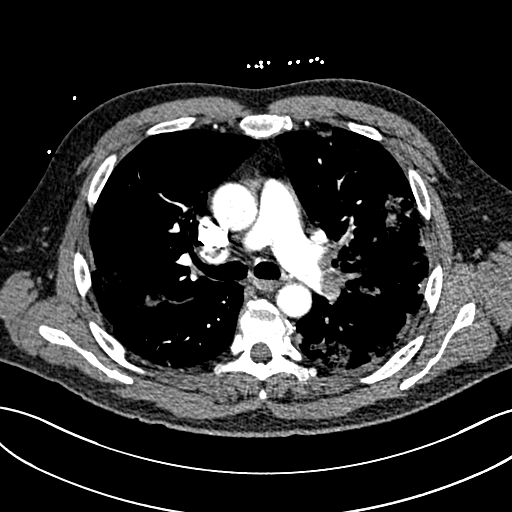
[im 181/287  lung]
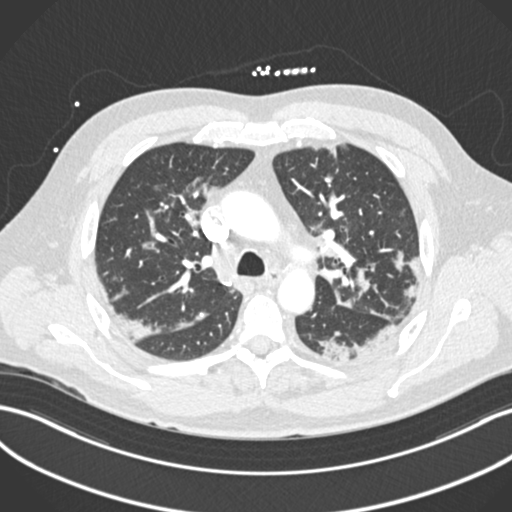
[im 196/287  mediastinal]
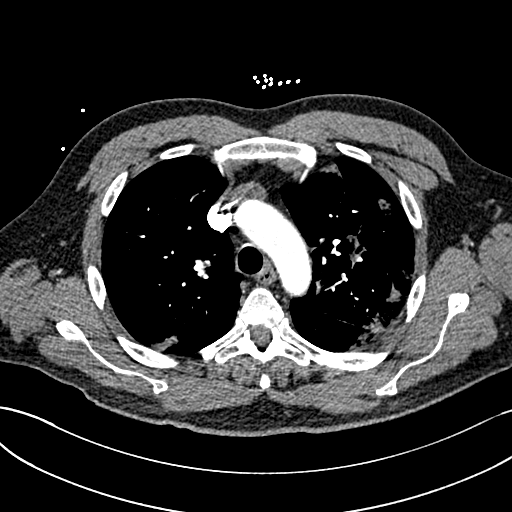
[im 211/287  lung]
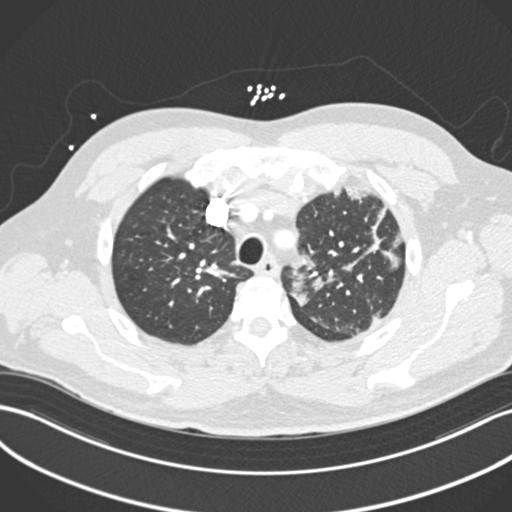
[im 226/287  mediastinal]
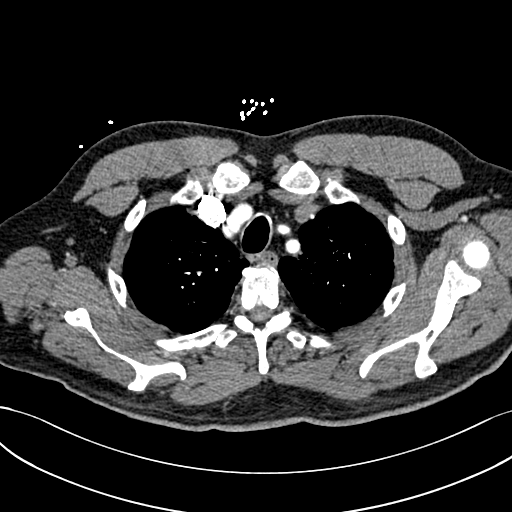
[im 241/287  lung]
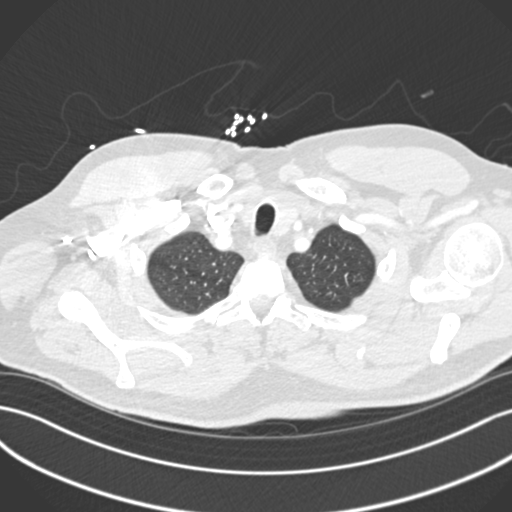
[im 256/287  mediastinal]
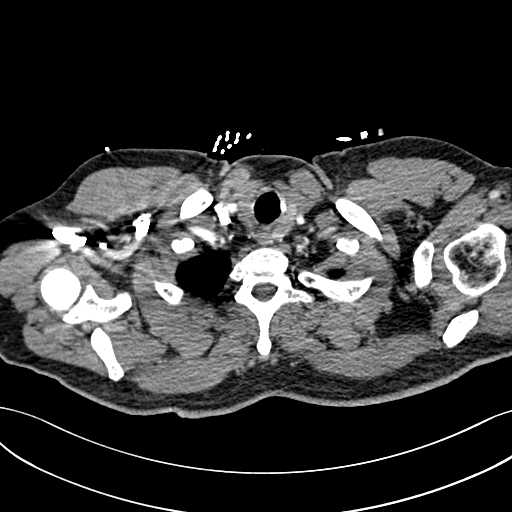
[im 271/287  lung]
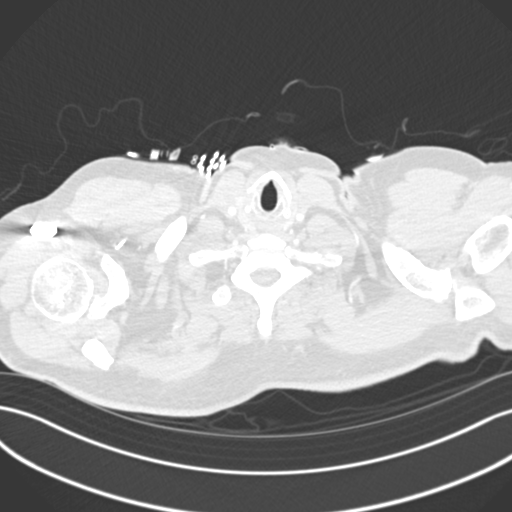

[18 of 36 positions shown; findings below may reference images not displayed]

FINDINGS: Cardiovascular: There is no cardiomegaly or pericardial effusion.
There is straightening of the interventricular septum with near
equal greatest ventricular lumen diameter each measuring
approximately 4.3 cm and ratio of 1. Findings suggestive of a degree
of right heart straining. The thoracic aorta is unremarkable. The
origins of the great vessels of the aortic arch appear patent. Large
bilateral central pulmonary artery emboli extending into the lobar
and segmental branches.

Mediastinum/Nodes: No hilar or mediastinal adenopathy. The esophagus
and thyroid gland are grossly unremarkable. No mediastinal fluid
collection.

Lungs/Pleura: Bilateral confluent airspace opacity primarily
involving the peripheral and subpleural lungs most consistent with
multifocal pneumonia and in keeping with [QU]. There is no
pleural effusion or pneumothorax. The central airways are patent.

Upper Abdomen: Diffuse fatty liver. Cholecystectomy. Loose stool
within the colon compatible with diarrheal state.

Musculoskeletal: No chest wall abnormality. No acute or significant
osseous findings.

Review of the MIP images confirms the above findings.
IMPRESSION: 1. Positive for acute PE with CTevidence of right heart strain
(RV/LV Ratio = 1) consistent with at least submassive (intermediate
risk) PE. The presence of right heart strain has been associated
with an increased risk of morbidity and mortality.
2. Multifocal pneumonia and in keeping with [QU].
3. Fatty liver.
4. Diarrheal state.

These results were called by telephone at the time of interpretation
on [DATE] at [DATE] to provider KIMITA , who verbally
acknowledged these results.

## 2019-08-08 IMAGING — DX DG CHEST 1V PORT
1 series · 1 of 1 positions shown · non-contrast
Comparison: [DATE], [DATE]

CLINICAL DATA: Left chest pain and shortness of breath, recent
[Z9] positive [DATE]

EXAM:
PORTABLE CHEST 1 VIEW

[chest ap]
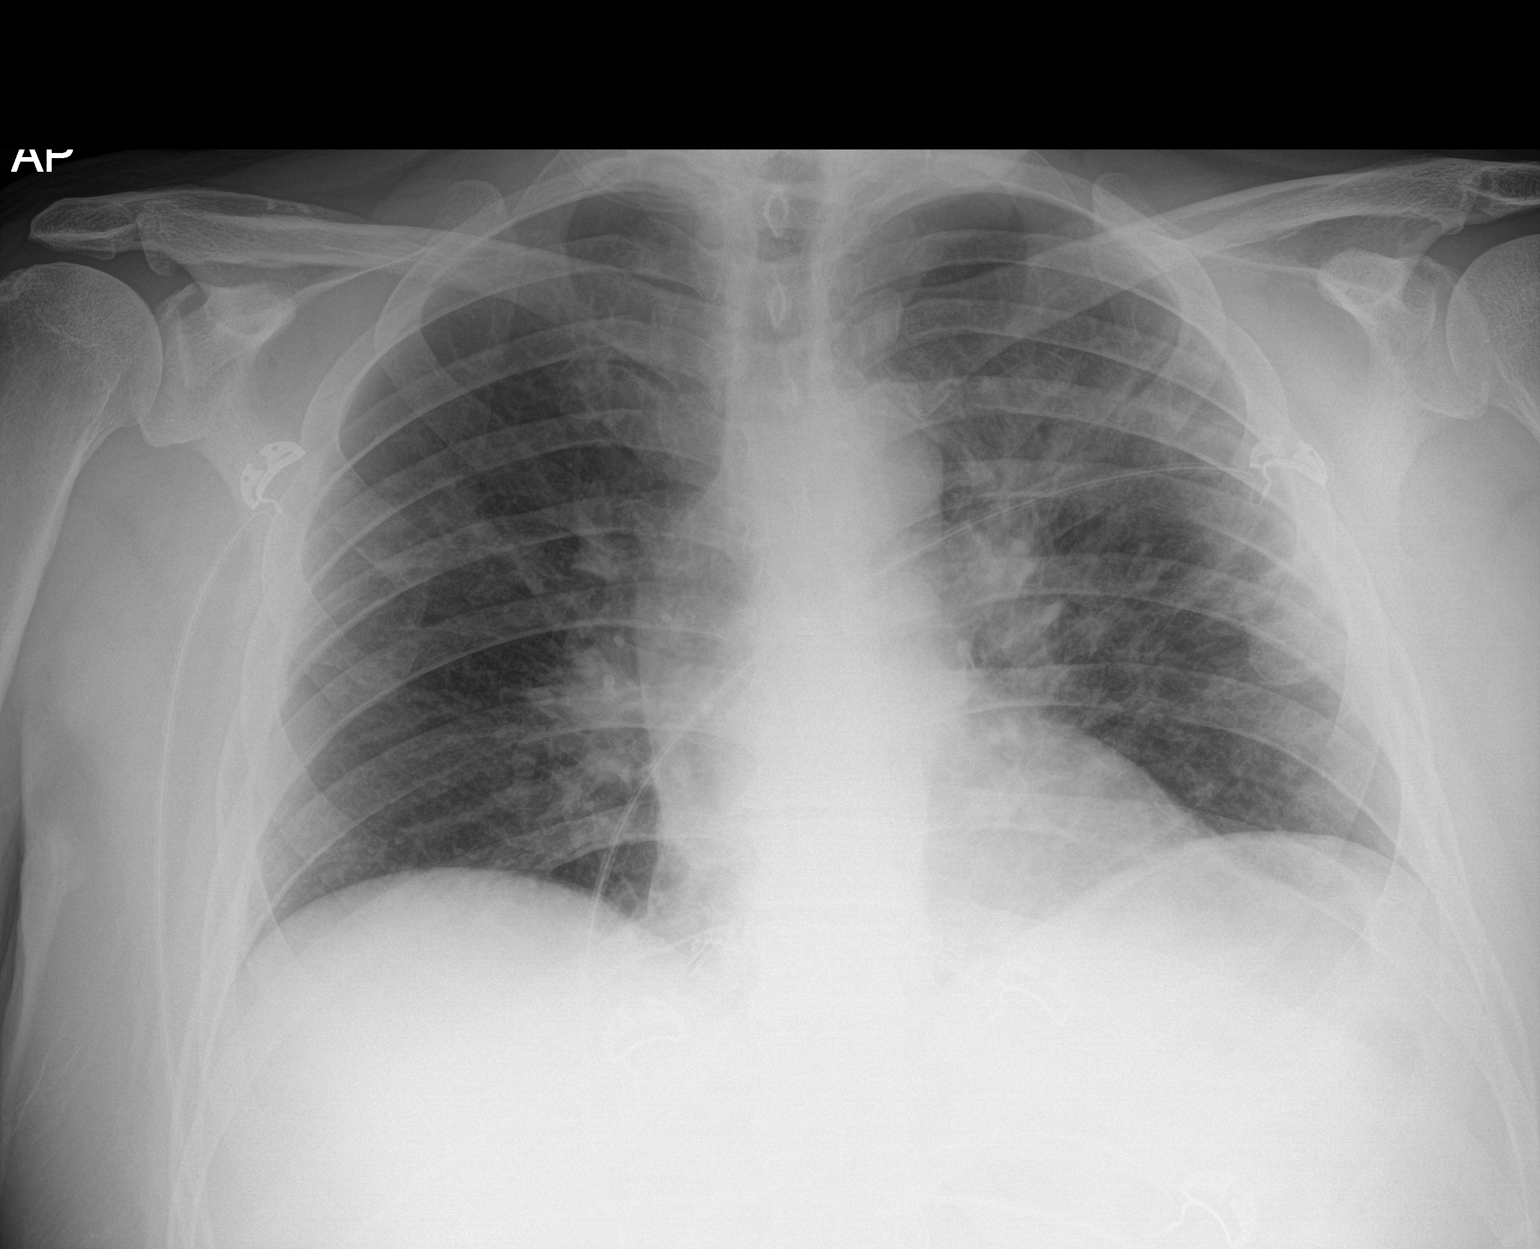

[1 of 1 positions shown; findings below may reference images not displayed]

FINDINGS: Single frontal view of the chest demonstrates an unremarkable
cardiac silhouette. There is patchy bilateral ground-glass airspace
disease, left greater than right. No effusion or pneumothorax. No
acute bony abnormality.
IMPRESSION: 1. Multifocal pneumonia, certainly compatible with [Z9].

## 2019-08-08 MED ORDER — HEPARIN (PORCINE) 25000 UT/250ML-% IV SOLN
1900.0000 [IU]/h | INTRAVENOUS | Status: AC
  Administered 2019-08-08: 1500 [IU]/h via INTRAVENOUS
  Administered 2019-08-09: 09:00:00 1800 [IU]/h via INTRAVENOUS
  Administered 2019-08-09 – 2019-08-10 (×2): 1900 [IU]/h via INTRAVENOUS
  Filled 2019-08-08 (×4): qty 250

## 2019-08-08 MED ORDER — HEPARIN BOLUS VIA INFUSION
5400.0000 [IU] | Freq: Once | INTRAVENOUS | Status: AC
  Administered 2019-08-08: 20:00:00 5400 [IU] via INTRAVENOUS

## 2019-08-08 MED ORDER — IOHEXOL 350 MG/ML SOLN
100.0000 mL | Freq: Once | INTRAVENOUS | Status: AC | PRN
  Administered 2019-08-08: 19:00:00 100 mL via INTRAVENOUS

## 2019-08-08 MED ORDER — MORPHINE SULFATE (PF) 4 MG/ML IV SOLN
4.0000 mg | INTRAVENOUS | Status: DC | PRN
  Administered 2019-08-08: 4 mg via INTRAVENOUS
  Filled 2019-08-08: qty 1

## 2019-08-08 MED ORDER — MORPHINE SULFATE (PF) 4 MG/ML IV SOLN
4.0000 mg | Freq: Once | INTRAVENOUS | Status: AC
  Administered 2019-08-08: 19:00:00 4 mg via INTRAVENOUS
  Filled 2019-08-08: qty 1

## 2019-08-08 NOTE — Progress Notes (Signed)
ANTICOAGULATION CONSULT NOTE - Initial Consult  Pharmacy Consult for Heparin Indication: pulmonary embolus  No Known Allergies  Patient Measurements: Height: 5\' 10"  (177.8 cm) Weight: 90.3 kg (199 lb) IBW/kg (Calculated) : 73 Heparin Dosing Weight: 90.3 kg  Vital Signs: Temp: 98.3 F (36.8 C) (04/21 1658) Temp Source: Oral (04/21 1658) BP: 116/82 (04/21 1826) Pulse Rate: 117 (04/21 1826)  Labs: Recent Labs    08/05/19 2037 08/08/19 1717  HGB 16.3 15.1  HCT 48.3 43.7  PLT 247 314  LABPROT  --  14.4  INR  --  1.1  CREATININE 1.74* 1.27*  TROPONINIHS  --  13    Estimated Creatinine Clearance: 67.3 mL/min (A) (by C-G formula based on SCr of 1.27 mg/dL (H)).   Medical History: Past Medical History:  Diagnosis Date  . Prostate cancer [facility])       Assessment: 64 yo M presents with new PE. Pharmacy asked to start heparin. Pt recently diagnosed with COVID on 4/7. D-dimer >20. CBC wnl. No AC noted PTA.  Goal of Therapy:  Heparin level 0.3-0.7 units/ml Monitor platelets by anticoagulation protocol: Yes   Plan:  - Give heparin IV 5400 unit bolus x1 - Start heparin infusion at 1500 units/hr - Check 6-hr HL - Monitor daily HL, CBC, s/sx bleeding  Richardine Service, PharmD PGY1 Pharmacy Resident Phone: [phone] 08/08/2019  7:35 PM  Please check [email or web address] for unit-specific pharmacy phone numbers.

## 2019-08-08 NOTE — ED Notes (Signed)
ED Provider at bedside. 

## 2019-08-08 NOTE — ED Provider Notes (Addendum)
[facility]Y DEPARTMENT Provider Note   CSN: [id number] Arrival date & time: 08/08/19  1647     History Chief Complaint  Patient presents with  . Chest Pain    Logan Harrison is a 64 y.o. male.  HPI Patient reports he did test Covid positive on 4\7.  He had had an exposure to someone he knew was positive at that time.  He reports that he has been having some fatigue and decreased energy level.  He was doing all right but about 5 days ago was having quite a bit of diarrhea and weakness.  He reports he was also feeling short of breath.  He was seen in the emergency department at that time.  He was rehydrated and discharged.  He reports he has continued to feel somewhat short of breath.  He is no longer having much cough.  He reports today he got a pretty severe chest pain on the left and has felt even more short of breath since that time.  Pain is not radiating to his neck arm or shoulder.  It is worse with deep inspiration.  He is not experiencing pain or swelling in his legs.  Patient is non-smoker.  He is otherwise healthy.    Past Medical History:  Diagnosis Date  . Prostate cancer [facility])     Patient Active Problem List   Diagnosis Date Noted  . Acute pulmonary embolus ([place]) 08/08/2019    Past Surgical History:  Procedure Laterality Date  . CHOLECYSTECTOMY    . PROSTATECTOMY         No family history on file.  Social History   Tobacco Use  . Smoking status: Never Smoker  . Smokeless tobacco: Never Used  Substance Use Topics  . Alcohol use: Yes    Comment: occ  . Drug use: Never    Home Medications Prior to Admission medications   Not on File    Allergies    Patient has no known allergies.  Review of Systems   Review of Systems 10 Systems reviewed and are negative for acute change except as noted in the HPI. Physical Exam Updated Vital Signs BP 114/77   Pulse (!) 108   Temp 99.8 F (37.7 C)   Resp (!) 40   Ht 5\' 10"  (1.778 m)    Wt 90.3 kg   SpO2 97%   BMI 28.55 kg/m   Physical Exam Constitutional:      Comments: Patient is alert with clear mental status.  Well-nourished well-developed.  Tachypnea.  HENT:     Head: Normocephalic and atraumatic.     Mouth/Throat:     Mouth: Mucous membranes are moist.     Pharynx: Oropharynx is clear.  Eyes:     Extraocular Movements: Extraocular movements intact.  Cardiovascular:     Pulses: Normal pulses.     Comments: Tachycardia.  No gross rub murmur gallop. Pulmonary:     Comments: Patient has crackles in bilateral lung fields.  Adequate airflow to bases. Abdominal:     General: There is no distension.     Palpations: Abdomen is soft.     Tenderness: There is no abdominal tenderness. There is no guarding.  Musculoskeletal:        General: No swelling or tenderness. Normal range of motion.     Cervical back: Neck supple.     Right lower leg: No edema.     Left lower leg: No edema.  Skin:    General:  Skin is warm and dry.  Neurological:     General: No focal deficit present.     Mental Status: He is oriented to person, place, and time.     Cranial Nerves: No cranial nerve deficit.     Motor: No weakness.     Coordination: Coordination normal.  Psychiatric:        Mood and Affect: Mood normal.     ED Results / Procedures / Treatments   Labs (all labs ordered are listed, but only abnormal results are displayed) Labs Reviewed  COMPREHENSIVE METABOLIC PANEL - Abnormal; Notable for the following components:      Result Value   Sodium 133 (*)    CO2 18 (*)    Glucose, Bld 126 (*)    Creatinine, Ser 1.27 (*)    Calcium 8.7 (*)    Albumin 3.1 (*)    AST 64 (*)    ALT 134 (*)    Alkaline Phosphatase 146 (*)    Total Bilirubin 2.2 (*)    GFR calc non Af Amer 60 (*)    All other components within normal limits  BRAIN NATRIURETIC PEPTIDE - Abnormal; Notable for the following components:   B Natriuretic Peptide 109.9 (*)    All other components within  normal limits  CBC WITH DIFFERENTIAL/PLATELET - Abnormal; Notable for the following components:   WBC 11.8 (*)    Neutro Abs 9.2 (*)    Monocytes Absolute 1.4 (*)    Abs Immature Granulocytes 0.09 (*)    All other components within normal limits  D-DIMER, QUANTITATIVE (NOT AT [facility]) - Abnormal; Notable for the following components:   D-Dimer, Quant >20.00 (*)    All other components within normal limits  CULTURE, BLOOD (ROUTINE X 2)  CULTURE, BLOOD (ROUTINE X 2)  RESPIRATORY PANEL BY RT PCR (FLU A&B, COVID)  PROTIME-INR  LACTIC ACID, PLASMA  LACTIC ACID, PLASMA  PROCALCITONIN  LACTATE DEHYDROGENASE  FERRITIN  TRIGLYCERIDES  FIBRINOGEN  C-REACTIVE PROTEIN  HEPARIN LEVEL (UNFRACTIONATED)  TROPONIN I (HIGH SENSITIVITY)  TROPONIN I (HIGH SENSITIVITY)    EKG EKG Interpretation  Date/Time:  Wednesday August 08 2019 17:07:17 EDT Ventricular Rate:  118 PR Interval:    QRS Duration: 157 QT Interval:  353 QTC Calculation: 495 R Axis:   -17 Text Interpretation: Sinus tachycardia Probable left atrial enlargement Left bundle branch block agree. 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 Confirmed by Charlesetta Shanks [phone]) on 08/08/2019 5:13:50 PM   Radiology CT Angio Chest PE W/Cm &/Or Wo Cm  Result Date: 08/08/2019 CLINICAL DATA:  64 year old male with chest pain and shortness of breath. Recent positive COVID-19 on 07/25/2019. EXAM: CT ANGIOGRAPHY CHEST WITH CONTRAST TECHNIQUE: Multidetector CT imaging of the chest was performed using the standard protocol during bolus administration of intravenous contrast. Multiplanar CT image reconstructions and MIPs were obtained to evaluate the vascular anatomy. CONTRAST:  136mL OMNIPAQUE IOHEXOL 350 MG/ML SOLN COMPARISON:  Chest radiograph dated 08/08/2019 and CT of the abdomen pelvis dated 08/05/2019. FINDINGS: Cardiovascular: There is no cardiomegaly or pericardial effusion. There is straightening of the interventricular septum with near equal greatest ventricular  lumen diameter each measuring approximately 4.3 cm and ratio of 1. Findings suggestive of a degree of right heart straining. The thoracic aorta is unremarkable. The origins of the great vessels of the aortic arch appear patent. Large bilateral central pulmonary artery emboli extending into the lobar and segmental branches. Mediastinum/Nodes: No hilar or mediastinal adenopathy. The esophagus and thyroid gland are grossly unremarkable. No  mediastinal fluid collection. Lungs/Pleura: Bilateral confluent airspace opacity primarily involving the peripheral and subpleural lungs most consistent with multifocal pneumonia and in keeping with COVID-19. There is no pleural effusion or pneumothorax. The central airways are patent. Upper Abdomen: Diffuse fatty liver. Cholecystectomy. Loose stool within the colon compatible with diarrheal state. Musculoskeletal: No chest wall abnormality. No acute or significant osseous findings. Review of the MIP images confirms the above findings. IMPRESSION: 1. Positive for acute PE with CTevidence of right heart strain (RV/LV Ratio = 1) consistent with at least submassive (intermediate risk) PE. The presence of right heart strain has been associated with an increased risk of morbidity and mortality. 2. Multifocal pneumonia and in keeping with COVID-19. 3. Fatty liver. 4. Diarrheal state. These results were called by telephone at the time of interpretation on 08/08/2019 at 7:31 pm to provider [facility] , who verbally acknowledged these results. Electronically Signed   By: Anner Crete M.D.   On: 08/08/2019 19:44   DG Chest Port 1 View  Result Date: 08/08/2019 CLINICAL DATA:  Left chest pain and shortness of breath, recent COVID-19 positive 07/25/2019 EXAM: PORTABLE CHEST 1 VIEW COMPARISON:  12/28/2011, 08/05/2019 FINDINGS: Single frontal view of the chest demonstrates an unremarkable cardiac silhouette. There is patchy bilateral ground-glass airspace disease, left greater than  right. No effusion or pneumothorax. No acute bony abnormality. IMPRESSION: 1. Multifocal pneumonia, certainly compatible with COVID-19. Electronically Signed   By: Randa Ngo M.D.   On: 08/08/2019 17:52    Procedures Procedures (including critical care time) CRITICAL CARE Performed by: Charlesetta Shanks   Total critical care time: 60 minutes  Critical care time was exclusive of separately billable procedures and treating other patients.  Critical care was necessary to treat or prevent imminent or life-threatening deterioration.  Critical care was time spent personally by me on the following activities: development of treatment plan with patient and/or surrogate as well as nursing, discussions with consultants, evaluation of patient's response to treatment, examination of patient, obtaining history from patient or surrogate, ordering and performing treatments and interventions, ordering and review of laboratory studies, ordering and review of radiographic studies, pulse oximetry and re-evaluation of patient's condition. Medications Ordered in ED Medications  heparin bolus via infusion 5,400 Units (has no administration in time range)  heparin ADULT infusion 100 units/mL (25000 units/27mL sodium chloride 0.45%) (has no administration in time range)  morphine 4 MG/ML injection 4 mg (4 mg Intravenous Given 08/08/19 1849)  iohexol (OMNIPAQUE) 350 MG/ML injection 100 mL (100 mLs Intravenous Contrast Given 08/08/19 1914)    ED Course  I have reviewed the triage vital signs and the nursing notes.  Pertinent labs & imaging results that were available during my care of the patient were reviewed by me and considered in my medical decision making (see chart for details).  Clinical Course as of Aug 07 1949  Wed Aug 08, 2019  1947 Consult: Reviewed with Dr. Myna Hidalgo.  Accepts for admission and transfer to [facility].  No recommendation at this time for remdesivir and Decadron, will decide at time of  evaluation if these are indicated.   [MP]    Clinical Course User Index [MP] Charlesetta Shanks, MD   MDM Rules/Calculators/A&P                     Patient had positive Covid testing 2 weeks ago.  Since that time, he had developed increased fatigue and diarrhea.  Shortness of breath over the past week but suddenly  worse today with associated chest pain.  Patient is tachypneic.  He was hypoxic to 90% on room air.  Patient is placed on 3 L with oxygen saturation at 96 to 99%.  Patient has crackles throughout both lung fields.  Patient has significantly elevated D-dimer and CT angiogram positive for bilateral PE.  Blood pressures have remained stable.  Heparin started.  Plan for admission. Final Clinical Impression(s) / ED Diagnoses Final diagnoses:  Pneumonia due to COVID-19 virus  Other acute pulmonary embolism [facility])    Rx / DC Orders ED Discharge Orders    None       Charlesetta Shanks, MD 08/08/19 Artist Pais    Charlesetta Shanks, MD 08/08/19 1952

## 2019-08-08 NOTE — ED Triage Notes (Addendum)
Pt c/o increase pain to left chest and SOB-states he was +covid 4/7-also states he was seen here 4/18-DOE noted when walking into triage

## 2019-08-08 NOTE — ED Notes (Signed)
Attempted to call report , RN to call back. [facility] enroute.

## 2019-08-09 ENCOUNTER — Inpatient Hospital Stay (HOSPITAL_COMMUNITY): Payer: Managed Care, Other (non HMO)

## 2019-08-09 DIAGNOSIS — J9600 Acute respiratory failure, unspecified whether with hypoxia or hypercapnia: Secondary | ICD-10-CM

## 2019-08-09 DIAGNOSIS — R7401 Elevation of levels of liver transaminase levels: Secondary | ICD-10-CM

## 2019-08-09 DIAGNOSIS — R17 Unspecified jaundice: Secondary | ICD-10-CM

## 2019-08-09 DIAGNOSIS — I2609 Other pulmonary embolism with acute cor pulmonale: Secondary | ICD-10-CM

## 2019-08-09 DIAGNOSIS — I2699 Other pulmonary embolism without acute cor pulmonale: Secondary | ICD-10-CM | POA: Diagnosis present

## 2019-08-09 DIAGNOSIS — U071 COVID-19: Secondary | ICD-10-CM | POA: Diagnosis present

## 2019-08-09 DIAGNOSIS — J1282 Pneumonia due to coronavirus disease 2019: Secondary | ICD-10-CM | POA: Diagnosis present

## 2019-08-09 LAB — ECHOCARDIOGRAM COMPLETE
Height: 70 in
Weight: 3128 oz

## 2019-08-09 LAB — CBC
HCT: 40.6 % (ref 39.0–52.0)
Hemoglobin: 13.8 g/dL (ref 13.0–17.0)
MCH: 31.1 pg (ref 26.0–34.0)
MCHC: 34 g/dL (ref 30.0–36.0)
MCV: 91.4 fL (ref 80.0–100.0)
Platelets: 313 10*3/uL (ref 150–400)
RBC: 4.44 MIL/uL (ref 4.22–5.81)
RDW: 11.9 % (ref 11.5–15.5)
WBC: 13.7 10*3/uL — ABNORMAL HIGH (ref 4.0–10.5)
nRBC: 0 % (ref 0.0–0.2)

## 2019-08-09 LAB — COMPREHENSIVE METABOLIC PANEL
ALT: 110 U/L — ABNORMAL HIGH (ref 0–44)
AST: 51 U/L — ABNORMAL HIGH (ref 15–41)
Albumin: 2.3 g/dL — ABNORMAL LOW (ref 3.5–5.0)
Alkaline Phosphatase: 134 U/L — ABNORMAL HIGH (ref 38–126)
Anion gap: 10 (ref 5–15)
BUN: 18 mg/dL (ref 8–23)
CO2: 20 mmol/L — ABNORMAL LOW (ref 22–32)
Calcium: 8.5 mg/dL — ABNORMAL LOW (ref 8.9–10.3)
Chloride: 106 mmol/L (ref 98–111)
Creatinine, Ser: 1.42 mg/dL — ABNORMAL HIGH (ref 0.61–1.24)
GFR calc Af Amer: >60 mL/min (ref >60)
GFR calc non Af Amer: 52 mL/min — ABNORMAL LOW (ref >60)
Glucose, Bld: 120 mg/dL — ABNORMAL HIGH (ref 70–99)
Potassium: 4.5 mmol/L (ref 3.5–5.1)
Sodium: 136 mmol/L (ref 135–145)
Total Bilirubin: 1.1 mg/dL (ref 0.3–1.2)
Total Protein: 6 g/dL — ABNORMAL LOW (ref 6.5–8.1)

## 2019-08-09 LAB — HEPARIN LEVEL (UNFRACTIONATED)
Heparin Unfractionated: 0.11 IU/mL — ABNORMAL LOW (ref 0.30–0.70)
Heparin Unfractionated: 0.4 IU/mL (ref 0.30–0.70)

## 2019-08-09 LAB — D-DIMER, QUANTITATIVE: D-Dimer, Quant: 15.08 ug/mL-FEU — ABNORMAL HIGH (ref 0.00–0.50)

## 2019-08-09 LAB — HIV ANTIBODY (ROUTINE TESTING W REFLEX): HIV Screen 4th Generation wRfx: NONREACTIVE

## 2019-08-09 LAB — FERRITIN: Ferritin: 2256 ng/mL — ABNORMAL HIGH (ref 24–336)

## 2019-08-09 LAB — C-REACTIVE PROTEIN: CRP: 20.2 mg/dL — ABNORMAL HIGH (ref <1.0)

## 2019-08-09 MED ORDER — HYDROCOD POLST-CPM POLST ER 10-8 MG/5ML PO SUER: 5.0000 mL | Freq: Two times a day (BID) | ORAL | Status: DC | PRN

## 2019-08-09 MED ORDER — BENZONATATE 100 MG PO CAPS
200.0000 mg | ORAL_CAPSULE | Freq: Three times a day (TID) | ORAL | Status: DC
  Administered 2019-08-09 – 2019-08-13 (×13): 200 mg via ORAL
  Filled 2019-08-09 (×13): qty 2

## 2019-08-09 MED ORDER — ONDANSETRON HCL 4 MG/2ML IJ SOLN: 4.0000 mg | Freq: Four times a day (QID) | INTRAMUSCULAR | Status: DC | PRN

## 2019-08-09 MED ORDER — HEPARIN BOLUS VIA INFUSION
3000.0000 [IU] | Freq: Once | INTRAVENOUS | Status: AC
  Administered 2019-08-09: 09:00:00 3000 [IU] via INTRAVENOUS
  Filled 2019-08-09: qty 3000

## 2019-08-09 MED ORDER — ALBUTEROL SULFATE HFA 108 (90 BASE) MCG/ACT IN AERS
2.0000 | INHALATION_SPRAY | RESPIRATORY_TRACT | Status: DC | PRN
  Filled 2019-08-09: qty 6.7

## 2019-08-09 MED ORDER — ACETAMINOPHEN 325 MG PO TABS: 650.0000 mg | ORAL_TABLET | Freq: Four times a day (QID) | ORAL | Status: DC | PRN

## 2019-08-09 MED ORDER — SODIUM CHLORIDE 0.9 % IV SOLN
100.0000 mg | INTRAVENOUS | Status: AC
  Administered 2019-08-09 (×2): 100 mg via INTRAVENOUS
  Filled 2019-08-09: qty 20

## 2019-08-09 MED ORDER — ENSURE ENLIVE PO LIQD
237.0000 mL | Freq: Two times a day (BID) | ORAL | Status: DC
  Administered 2019-08-09 – 2019-08-12 (×5): 237 mL via ORAL

## 2019-08-09 MED ORDER — SODIUM CHLORIDE 0.9 % IV SOLN
100.0000 mg | Freq: Every day | INTRAVENOUS | Status: AC
  Administered 2019-08-10 – 2019-08-13 (×4): 100 mg via INTRAVENOUS
  Filled 2019-08-09 (×4): qty 20

## 2019-08-09 MED ORDER — DEXAMETHASONE SODIUM PHOSPHATE 10 MG/ML IJ SOLN
6.0000 mg | INTRAMUSCULAR | Status: DC
  Administered 2019-08-09: 6 mg via INTRAVENOUS
  Filled 2019-08-09 (×2): qty 1

## 2019-08-09 MED ORDER — METHYLPREDNISOLONE SODIUM SUCC 40 MG IJ SOLR
40.0000 mg | Freq: Two times a day (BID) | INTRAMUSCULAR | Status: DC
  Administered 2019-08-09 – 2019-08-11 (×6): 40 mg via INTRAVENOUS
  Filled 2019-08-09 (×6): qty 1

## 2019-08-09 NOTE — H&P (Signed)
History and Physical    Logan Harrison [id number] DOB: Jul 23, 1955 DOA: 08/08/2019  PCP: [facility]  Patient coming from: Home  I have personally briefly reviewed patient's old medical records in [place]  Chief Complaint: Bilateral PE  HPI: Logan Harrison is a 64 y.o. male with medical history significant for prostate cancer s/p proctectomy who presented as transfer from [facility] ED for bilateral PE.   Pt was COVID + on 4/7 and has been having symptoms of cough and fatigued. Then today he developed acute onset shortness of breath and left sided chest pain. Shortness of breath is both at rest and with exertion. Chest pain with inspiration. Feels weak.No LE edema. States cough is improving. Denies nausea or vomiting. Has decrease appetite.  Denies tobacco, alcohol or illicit drug use.   ED Course:Temp of 99.8, tachycardic and required 3L via [place]. WBC of 11.8. Na of 133, glucose of 126, creatinine of 1.27, AST of 64, ALT of 146, total bilirubin of 2.2. CRP of 18. Procalcitonin of 0.17. D-dimer of 20.   CTA chest showed acute large bilateral PE with evidence of right heart strain. Multifocal pneumonia consistent with COVID.   Review of Systems: Constitutional: No Weight Change, No Fever ENT/Mouth: No sore throat, No Rhinorrhea Eyes: No Eye Pain, No Vision Changes Cardiovascular: + Chest Pain, + SOB Respiratory: No Cough, No Sputum Gastrointestinal: No Nausea, No Vomiting, No Diarrhea, No Constipation, No Pain Genitourinary: no Urinary Incontinence, No Urgency, No Flank Pain Musculoskeletal: No Arthralgias, No Myalgias Skin: No Skin Lesions, No Pruritus, Neuro: no Weakness, No Numbness,  No Loss of Consciousness, No Syncope Psych: No Anxiety/Panic, No Depression, no decrease appetite Heme/Lymph: No Bruising, No Bleeding  Past Medical History:  Diagnosis Date   Prostate cancer ([place])     Past Surgical History:  Procedure Laterality Date    CHOLECYSTECTOMY     PROSTATECTOMY    Appendectomy   reports that he has never smoked. He has never used smokeless tobacco. He reports current alcohol use. He reports that he does not use drugs.  No Known Allergies  Family Hx Mother- HTN, diabetes  Prior to Admission medications   Not on File    Physical Exam: Vitals:   08/08/19 2130 08/08/19 2150 08/08/19 2200 08/08/19 2355  BP: 113/80  112/80 105/77  Pulse: (!) 106  (!) 102 (!) 108  Resp: (!) 30  (!) 30 (!) 22  Temp:  99.2 F (37.3 C)  98 F (36.7 C)  TempSrc:    Oral  SpO2: 96%  97% 95%  Weight:    88.7 kg  Height:    5\' 10"  (1.778 m)    Constitutional: NAD, calm, comfortable, elderly male asleep in bed Vitals:   08/08/19 2130 08/08/19 2150 08/08/19 2200 08/08/19 2355  BP: 113/80  112/80 105/77  Pulse: (!) 106  (!) 102 (!) 108  Resp: (!) 30  (!) 30 (!) 22  Temp:  99.2 F (37.3 C)  98 F (36.7 C)  TempSrc:    Oral  SpO2: 96%  97% 95%  Weight:    88.7 kg  Height:    5\' 10"  (1.778 m)   Eyes: PERRL, lids and conjunctivae normal ENMT: Mucous membranes are moist. Posterior pharynx clear of any exudate or lesions.Normal dentition.  Neck: normal, supple Respiratory: bibasilar crackles. Normal respiratory effort on 3L via [place]. No accessory muscle use.  Cardiovascular: Tachycardic, no murmurs / rubs / gallops. No extremity edema. 2+ pedal pulses.  Abdomen: no tenderness, no masses palpated.  Bowel sounds positive.  Musculoskeletal: no clubbing / cyanosis. No joint deformity upper and lower extremities. Good ROM, no contractures. Normal muscle tone.  Skin: no rashes, lesions, ulcers. No induration Neurologic: CN 2-12 grossly intact. Sensation intact. Strength 5/5 in all 4.  Psychiatric: Normal judgment and insight. Alert and oriented x 3. Normal mood.     Labs on Admission: I have personally reviewed following labs and imaging studies  CBC: Recent Labs  Lab 08/05/19 2037 08/08/19 1717  WBC 6.8 11.8*  NEUTROABS  4.5 9.2*  HGB 16.3 15.1  HCT 48.3 43.7  MCV 91.8 91.2  PLT 247 [id number]   Basic Metabolic Panel: Recent Labs  Lab 08/05/19 2037 08/08/19 1717  NA 137 133*  K 3.3* 4.0  CL 104 103  CO2 22 18*  GLUCOSE 129* 126*  BUN 34* 18  CREATININE 1.74* 1.27*  CALCIUM 8.4* 8.7*   GFR: Estimated Creatinine Clearance: 66.8 mL/min (A) (by C-G formula based on SCr of 1.27 mg/dL (H)). Liver Function Tests: Recent Labs  Lab 08/05/19 2037 08/08/19 1717  AST 119* 64*  ALT 210* 134*  ALKPHOS 138* 146*  BILITOT 0.5 2.2*  PROT 6.8 7.3  ALBUMIN 3.2* 3.1*   Recent Labs  Lab 08/05/19 2037  LIPASE 186*   No results for input(s): AMMONIA in the last 168 hours. Coagulation Profile: Recent Labs  Lab 08/08/19 1717  INR 1.1   Cardiac Enzymes: No results for input(s): CKTOTAL, CKMB, CKMBINDEX, TROPONINI in the last 168 hours. BNP (last 3 results) No results for input(s): PROBNP in the last 8760 hours. HbA1C: No results for input(s): HGBA1C in the last 72 hours. CBG: No results for input(s): GLUCAP in the last 168 hours. Lipid Profile: Recent Labs    08/08/19 1741  TRIG 143   Thyroid Function Tests: No results for input(s): TSH, T4TOTAL, FREET4, T3FREE, THYROIDAB in the last 72 hours. Anemia Panel: Recent Labs    08/08/19 1741  FERRITIN 1,937*   Urine analysis:    Component Value Date/Time   COLORURINE YELLOW 08/05/2019 2037   APPEARANCEUR CLEAR 08/05/2019 2037   LABSPEC 1.015 08/05/2019 2037   PHURINE 6.0 08/05/2019 2037   GLUCOSEU NEGATIVE 08/05/2019 2037   HGBUR TRACE (A) 08/05/2019 2037   [place] NEGATIVE 08/05/2019 2037   [place] NEGATIVE 08/05/2019 2037   PROTEINUR 30 (A) 08/05/2019 2037   NITRITE NEGATIVE 08/05/2019 2037   LEUKOCYTESUR NEGATIVE 08/05/2019 2037    Radiological Exams on Admission: CT Angio Chest PE W/Cm &/Or Wo Cm  Result Date: 08/08/2019 CLINICAL DATA:  64 year old male with chest pain and shortness of breath. Recent positive COVID-19 on  07/25/2019. EXAM: CT ANGIOGRAPHY CHEST WITH CONTRAST TECHNIQUE: Multidetector CT imaging of the chest was performed using the standard protocol during bolus administration of intravenous contrast. Multiplanar CT image reconstructions and MIPs were obtained to evaluate the vascular anatomy. CONTRAST:  16mL OMNIPAQUE IOHEXOL 350 MG/ML SOLN COMPARISON:  Chest radiograph dated 08/08/2019 and CT of the abdomen pelvis dated 08/05/2019. FINDINGS: Cardiovascular: There is no cardiomegaly or pericardial effusion. There is straightening of the interventricular septum with near equal greatest ventricular lumen diameter each measuring approximately 4.3 cm and ratio of 1. Findings suggestive of a degree of right heart straining. The thoracic aorta is unremarkable. The origins of the great vessels of the aortic arch appear patent. Large bilateral central pulmonary artery emboli extending into the lobar and segmental branches. Mediastinum/Nodes: No hilar or mediastinal adenopathy. The esophagus and thyroid gland  are grossly unremarkable. No mediastinal fluid collection. Lungs/Pleura: Bilateral confluent airspace opacity primarily involving the peripheral and subpleural lungs most consistent with multifocal pneumonia and in keeping with COVID-19. There is no pleural effusion or pneumothorax. The central airways are patent. Upper Abdomen: Diffuse fatty liver. Cholecystectomy. Loose stool within the colon compatible with diarrheal state. Musculoskeletal: No chest wall abnormality. No acute or significant osseous findings. Review of the MIP images confirms the above findings. IMPRESSION: 1. Positive for acute PE with CTevidence of right heart strain (RV/LV Ratio = 1) consistent with at least submassive (intermediate risk) PE. The presence of right heart strain has been associated with an increased risk of morbidity and mortality. 2. Multifocal pneumonia and in keeping with COVID-19. 3. Fatty liver. 4. Diarrheal state. These results  were called by telephone at the time of interpretation on 08/08/2019 at 7:31 pm to provider [facility] , who verbally acknowledged these results. Electronically Signed   By: Anner Crete M.D.   On: 08/08/2019 19:44   DG Chest Port 1 View  Result Date: 08/08/2019 CLINICAL DATA:  Left chest pain and shortness of breath, recent COVID-19 positive 07/25/2019 EXAM: PORTABLE CHEST 1 VIEW COMPARISON:  12/28/2011, 08/05/2019 FINDINGS: Single frontal view of the chest demonstrates an unremarkable cardiac silhouette. There is patchy bilateral ground-glass airspace disease, left greater than right. No effusion or pneumothorax. No acute bony abnormality. IMPRESSION: 1. Multifocal pneumonia, certainly compatible with COVID-19. Electronically Signed   By: Randa Ngo M.D.   On: 08/08/2019 17:52    EKG: Independently reviewed.   Assessment/Plan  Bilateral PE with right heart strain Continue heparin gtt check echocardiogram   Acute hypoxic respiratory failure on 3L via [place] could be multifactorial from PE and COVID pneumonia heparin as above will start decadron and Remdesvir   Transaminitis/ Elevated bilirubin likely due to COVID infection  DVT prophylaxis: Heparin gtt Code Status: Full Family Communication: Plan discussed with patient at bedside  disposition Plan: Home with at least 2 midnight stays  Consults called:  Admission status: inpatient  Status is: Inpatient  Remains inpatient appropriate because of need for close monitoring with IV anticoagulation and acute hypoxic respiratory failure   Dispo: The patient is from: Home              Anticipated d/c is to: Home              Anticipated d/c date is: 3 days              Patient currently is not medically stable to d/c.          Orene Desanctis DO [facility]   If 7PM-7AM, please contact night-coverage [email or web address]   08/09/2019, 2:31 AM

## 2019-08-09 NOTE — Progress Notes (Signed)
PROGRESS NOTE                                                                                                                                                                                                             Patient Demographics:    Logan Harrison, is a 64 y.o. male, DOB - 1956/02/17, [id number]  Outpatient Primary MD for the patient is Practice, [place] date - 08/08/2019   LOS - 1  Chief Complaint  Patient presents with  . Chest Pain       Brief Narrative: Patient is a 64 y.o. male with PMHx of prostate cancer s/p prostatectomy, remote history of lower extremity DVT when he was in high school-recently diagnosed with COVID-19 on 4/7-presenting with cough, left-sided chest pain-further evaluation revealed acute hypoxemic respiratory failure secondary to pulmonary embolism and COVID-19 pneumonia.  Significant Events: 4/7>> COVID-19 positive 4/21>> admit to [facility] for hypoxemia secondary to PE and COVID-19 pneumonia  COVID-19 medications: Steroids: 4/21>> Remdesivir: 4/21   Antibiotics: none  Microbiology data: 4/21: Blood cultures>> negative  DVT prophylaxis: IV heparin  Procedures: None  Consults: None    Subjective:    [name] today feels essentially the same-continues to cough-has not ambulated but does acknowledge exertional dyspnea before this hospital stay.   Assessment  & Plan :   Acute Hypoxic Resp Failure due to Covid 19 Viral pneumonia and pulmonary embolism: Appears essentially unchanged-he is comfortable.  CRP trending up-but oxygen requirements are stable.  Plans are to continue steroids/remdesivir and IV heparin.  Await echo, lower extremity Doppler.  Although has CT evidence of RV strain-he clinically does not have any suggestion of RV failure-and hypoxemia appears to be mild-we will follow closely.  Note-patient has consented to the off label use of  Actemra-if his hypoxemia worsens (if not felt to be from PE).  Risks/benefits and rationale explained-he does not have a history of TB, hepatitis B or chronic diverticulitis.  Fever: afebrile  O2 requirements:  SpO2: 92 % O2 Flow Rate (L/min): 3 L/min   COVID-19 Labs: Recent Labs    08/08/19 1741 08/09/19 0759  DDIMER >20.00* 15.08*  FERRITIN 1,937* 2,256*  LDH 355*  --   CRP 18.1* 20.2*       Component Value Date/Time   BNP 109.9 (H) 08/08/2019 1717  Recent Labs  Lab 08/08/19 1741  PROCALCITON 0.17    Lab Results  Component Value Date   SARSCOV2NAA POSITIVE (A) 08/08/2019    Prone/Incentive Spirometry: encouraged  incentive spirometry use 3-4/hour.  AKI: Appears to be mild-likely hemodynamically mediated-follow.  Transaminitis: Likely secondary to COVID-19-stable for follow-up without any further work-up.  History of prostate cancer s/p prostatectomy  ABG: No results found for: PHART, PCO2ART, PO2ART, HCO3, TCO2, ACIDBASEDEF, O2SAT  Vent Settings: N/A  Condition -Stable  Family Communication  : Son updated over the phone (RN at [employer])  Code Status :  Full Code   Diet :  Diet Order            Diet Heart Room service appropriate? Yes; Fluid consistency: Thin  Diet effective now               Disposition Plan  :   Status is: Inpatient  Remains inpatient appropriate because:Inpatient level of care appropriate due to severity of illness  Dispo: The patient is from: Home              Anticipated d/c is to: Home              Anticipated d/c date is: 3 days              Patient currently is not medically stable to d/c.  Barriers to discharge: Hypoxia requiring O2 supplementation/complete 5 days of IV Remdesivir  Antimicorbials  :    Anti-infectives (From admission, onward)   Start     Dose/Rate Route Frequency Ordered Stop   08/10/19 1000  remdesivir 100 mg in sodium chloride 0.9 % 100 mL IVPB     100 mg 200 mL/hr over 30 Minutes  Intravenous Daily 08/09/19 0155 08/14/19 0959   08/09/19 0200  remdesivir 100 mg in sodium chloride 0.9 % 100 mL IVPB     100 mg 200 mL/hr over 30 Minutes Intravenous Every 30 min 08/09/19 0155 08/09/19 0413      Inpatient Medications  Scheduled Meds: . benzonatate  200 mg Oral TID  . dexamethasone (DECADRON) injection  6 mg Intravenous Q24H   Continuous Infusions: . heparin 1,800 Units/hr (08/09/19 0910)  . [START ON 08/10/2019] remdesivir 100 mg in NS 100 mL     PRN Meds:.acetaminophen, albuterol, chlorpheniramine-HYDROcodone, morphine injection, ondansetron (ZOFRAN) IV   Time Spent in minutes  35   See all Orders from today for further details   Oren Binet M.D on 08/09/2019 at 11:41 AM  To page go to [email or web address] - use universal password  [facility] -  Office  [phone]    Objective:   Vitals:   08/08/19 2200 08/08/19 2355 08/09/19 0400 08/09/19 0801  BP: 112/80 105/77 101/71 107/79  Pulse: (!) 102 (!) 108 (!) 101 92  Resp: (!) 30 (!) 22  18  Temp:  98 F (36.7 C) 98.2 F (36.8 C) 97.7 F (36.5 C)  TempSrc:  Oral Axillary Oral  SpO2: 97% 95% 95% 92%  Weight:  88.7 kg    Height:  5\' 10"  (1.778 m)      Wt Readings from Last 3 Encounters:  08/08/19 88.7 kg  08/05/19 86.2 kg     Intake/Output Summary (Last 24 hours) at 08/09/2019 1141 Last data filed at 08/09/2019 0913 Gross per 24 hour  Intake 397.7 ml  Output --  Net 397.7 ml     Physical Exam Gen Exam:Alert awake-not in any distress HEENT:atraumatic, normocephalic Chest: B/L clear to  auscultation anteriorly CVS:S1S2 regular Abdomen:soft non tender, non distended Extremities:no edema Neurology: Non focal Skin: no rash   Data Review:    CBC Recent Labs  Lab 08/05/19 2037 08/08/19 1717 08/09/19 0759  WBC 6.8 11.8* 13.7*  HGB 16.3 15.1 13.8  HCT 48.3 43.7 40.6  PLT 247 314 313  MCV 91.8 91.2 91.4  MCH 31.0 31.5 31.1  MCHC 33.7 34.6 34.0  RDW 11.9 11.8 11.9  LYMPHSABS  1.5 1.0  --   MONOABS 0.7 1.4*  --   EOSABS 0.1 0.0  --   BASOSABS 0.0 0.0  --     Chemistries  Recent Labs  Lab 08/05/19 2037 08/08/19 1717 08/09/19 0759  NA 137 133* 136  K 3.3* 4.0 4.5  CL 104 103 106  CO2 22 18* 20*  GLUCOSE 129* 126* 120*  BUN 34* 18 18  CREATININE 1.74* 1.27* 1.42*  CALCIUM 8.4* 8.7* 8.5*  AST 119* 64* 51*  ALT 210* 134* 110*  ALKPHOS 138* 146* 134*  BILITOT 0.5 2.2* 1.1   ------------------------------------------------------------------------------------------------------------------ Recent Labs    08/08/19 1741  TRIG 143    No results found for: HGBA1C ------------------------------------------------------------------------------------------------------------------ No results for input(s): TSH, T4TOTAL, T3FREE, THYROIDAB in the last 72 hours.  Invalid input(s): FREET3 ------------------------------------------------------------------------------------------------------------------ Recent Labs    08/08/19 1741 08/09/19 0759  FERRITIN 1,937* 2,256*    Coagulation profile Recent Labs  Lab 08/08/19 1717  INR 1.1    Recent Labs    08/08/19 1741 08/09/19 0759  DDIMER >20.00* 15.08*    Cardiac Enzymes No results for input(s): CKMB, TROPONINI, MYOGLOBIN in the last 168 hours.  Invalid input(s): CK ------------------------------------------------------------------------------------------------------------------    Component Value Date/Time   BNP 109.9 (H) 08/08/2019 1717    Micro Results Recent Results (from the past 240 hour(s))  Blood Culture (routine x 2)     Status: None (Preliminary result)   Collection Time: 08/08/19  5:17 PM   Specimen: BLOOD  Result Value Ref Range Status   Specimen Description   Final    BLOOD RIGHT ANTECUBITAL Performed at [facility], [address] [place], [place] [address]    Special Requests   Final    BOTTLES DRAWN AEROBIC AND ANAEROBIC Blood Culture adequate  volume Performed at [facility], [address] [place], [place] [address]    Culture   Final    NO GROWTH < 12 HOURS Performed at [facility], [place] [address] [place], [place] [address]    Report Status PENDING  Incomplete  Blood Culture (routine x 2)     Status: None (Preliminary result)   Collection Time: 08/08/19  5:41 PM   Specimen: BLOOD RIGHT HAND  Result Value Ref Range Status   Specimen Description   Final    BLOOD RIGHT HAND Performed at [facility], [place] [address], [place], [place] [address]    Special Requests   Final    BOTTLES DRAWN AEROBIC AND ANAEROBIC Blood Culture adequate volume Performed at [facility], [address] [place], [place] [address]    Culture   Final    NO GROWTH < 12 HOURS Performed at [facility] [address] [place], [place] [address]    Report Status PENDING  Incomplete  Respiratory Panel by RT PCR (Flu A&B, Covid) - Nasopharyngeal Swab     Status: Abnormal   Collection Time: 08/08/19  7:56 PM   Specimen: Nasopharyngeal Swab  Result Value Ref Range Status   SARS Coronavirus 2 by RT PCR POSITIVE (A) NEGATIVE Final    Comment: RESULT CALLED TO, READ BACK BY AND VERIFIED WITH: ADKINS,L AT 2100 ON [id number] BY CHERESNOWSKY,T (NOTE) SARS-CoV-2 target nucleic acids are DETECTED. SARS-CoV-2 RNA is generally detectable in upper respiratory specimens  during the acute phase of infection. Positive results are indicative of the presence of the identified virus, but do not rule out bacterial infection or co-infection with other pathogens not detected by the test. Clinical correlation with patient history and other diagnostic information is necessary to determine patient infection status. The expected result is Negative. Fact Sheet for Patients:  PinkCheek.be Fact Sheet for Healthcare Providers: GravelBags.it This test is not yet approved or  cleared by the [place] FDA and  has been authorized for detection and/or diagnosis of SARS-CoV-2 by FDA under an Emergency Use Authorization (EUA).  This EUA will remain in effect (meaning this test can b e used) for the duration of  the COVID-19 declaration under Section 564(b)(1) of the Act, 21 U.S.C. section 360bbb-3(b)(1), unless the authorization is terminated or revoked sooner.    Influenza A by PCR NEGATIVE NEGATIVE Final   Influenza B by PCR NEGATIVE NEGATIVE Final    Comment: (NOTE) The Xpert Xpress SARS-CoV-2/FLU/RSV assay is intended as an aid in  the diagnosis of influenza from Nasopharyngeal swab specimens and  should not be used as a sole basis for treatment. Nasal washings and  aspirates are unacceptable for Xpert Xpress SARS-CoV-2/FLU/RSV  testing. Fact Sheet for Patients: PinkCheek.be Fact Sheet for Healthcare Providers: GravelBags.it This test is not yet approved or cleared by the [place] FDA and  has been authorized for detection and/or diagnosis of SARS-CoV-2 by  FDA under an Emergency Use Authorization (EUA). This EUA will remain  in effect (meaning this test can be used) for the duration of the  Covid-19 declaration under Section 564(b)(1) of the Act, 21  U.S.C. section 360bbb-3(b)(1), unless the authorization is  terminated or revoked. Performed at [facility], [address], [place], [place] [address]     Radiology Reports CT Angio Chest PE W/Cm &/Or Wo Cm  Result Date: 08/08/2019 CLINICAL DATA:  64 year old male with chest pain and shortness of breath. Recent positive COVID-19 on 07/25/2019. EXAM: CT ANGIOGRAPHY CHEST WITH CONTRAST TECHNIQUE: Multidetector CT imaging of the chest was performed using the standard protocol during bolus administration of intravenous contrast. Multiplanar CT image reconstructions and MIPs were obtained to evaluate the vascular anatomy. CONTRAST:   158mL OMNIPAQUE IOHEXOL 350 MG/ML SOLN COMPARISON:  Chest radiograph dated 08/08/2019 and CT of the abdomen pelvis dated 08/05/2019. FINDINGS: Cardiovascular: There is no cardiomegaly or pericardial effusion. There is straightening of the interventricular septum with near equal greatest ventricular lumen diameter each measuring approximately 4.3 cm and ratio of 1. Findings suggestive of a degree of right heart straining. The thoracic aorta is unremarkable. The origins of the great vessels of the aortic arch appear patent. Large bilateral central pulmonary artery emboli extending into the lobar and segmental branches. Mediastinum/Nodes: No hilar or mediastinal adenopathy. The esophagus and thyroid gland are grossly unremarkable. No mediastinal fluid collection. Lungs/Pleura: Bilateral confluent airspace opacity primarily involving the peripheral and subpleural lungs most consistent with multifocal pneumonia and in keeping with COVID-19. There is no pleural effusion or pneumothorax. The central airways are patent. Upper Abdomen: Diffuse fatty liver. Cholecystectomy. Loose stool within the colon compatible with diarrheal state. Musculoskeletal: No chest wall abnormality.  No acute or significant osseous findings. Review of the MIP images confirms the above findings. IMPRESSION: 1. Positive for acute PE with CTevidence of right heart strain (RV/LV Ratio = 1) consistent with at least submassive (intermediate risk) PE. The presence of right heart strain has been associated with an increased risk of morbidity and mortality. 2. Multifocal pneumonia and in keeping with COVID-19. 3. Fatty liver. 4. Diarrheal state. These results were called by telephone at the time of interpretation on 08/08/2019 at 7:31 pm to provider [facility] , who verbally acknowledged these results. Electronically Signed   By: Anner Crete M.D.   On: 08/08/2019 19:44   CT ABDOMEN PELVIS W CONTRAST  Result Date: 08/05/2019 CLINICAL DATA:   Diarrhea. Elevated liver enzymes and pancreatic enzymes. EXAM: CT ABDOMEN AND PELVIS WITH CONTRAST TECHNIQUE: Multidetector CT imaging of the abdomen and pelvis was performed using the standard protocol following bolus administration of intravenous contrast. CONTRAST:  40mL OMNIPAQUE IOHEXOL 300 MG/ML  SOLN COMPARISON:  None. FINDINGS: Lower chest: Patchy ground-glass airspace opacities dependently in the lower lobes as well as in the posterior lingula. Cannot exclude pneumonia or COVID pneumonia. No effusions. Heart is normal size. Hepatobiliary: Mild diffuse fatty infiltration of the liver. No focal hepatic abnormality. Prior cholecystectomy. Pancreas: No focal abnormality or ductal dilatation. Spleen: No focal abnormality.  Normal size. Adrenals/Urinary Tract: Atrophic left kidney. No renal or adrenal mass. No hydronephrosis or stones. Urinary bladder unremarkable. Stomach/Bowel: Mildly prominent small bowel loops in the left upper quadrant. Other small bowel loops are fluid-filled. No well-defined focal transition zone. I favor this represents mild focal ileus. Stomach and large bowel unremarkable. Vascular/Lymphatic: No evidence of aneurysm or adenopathy. Scattered aortic atherosclerosis. Reproductive: No visible focal abnormality. Other: No free fluid or free air. Musculoskeletal: No acute bony abnormality. Degenerative facet disease in the lower lumbar spine. Associated slight anterolisthesis of L5 on S1. IMPRESSION: Mild diffuse fatty infiltration of the liver. Few prominent small bowel loops in the left upper quadrant, favor mild focal ileus rather than bowel obstruction. Aortic atherosclerosis. Prior cholecystectomy. Mildly atrophic left kidney. Electronically Signed   By: Rolm Baptise M.D.   On: 08/05/2019 22:58   DG Chest Port 1 View  Result Date: 08/08/2019 CLINICAL DATA:  Left chest pain and shortness of breath, recent COVID-19 positive 07/25/2019 EXAM: PORTABLE CHEST 1 VIEW COMPARISON:   12/28/2011, 08/05/2019 FINDINGS: Single frontal view of the chest demonstrates an unremarkable cardiac silhouette. There is patchy bilateral ground-glass airspace disease, left greater than right. No effusion or pneumothorax. No acute bony abnormality. IMPRESSION: 1. Multifocal pneumonia, certainly compatible with COVID-19. Electronically Signed   By: Randa Ngo M.D.   On: 08/08/2019 17:52

## 2019-08-09 NOTE — Progress Notes (Signed)
  Echocardiogram 2D Echocardiogram has been performed.  Logan Harrison 08/09/2019, 12:01 PM

## 2019-08-09 NOTE — Progress Notes (Signed)
Initial Nutrition Assessment  DOCUMENTATION CODES:   Not applicable  INTERVENTION:  Provide Ensure Enlive po BID, each supplement provides 350 kcal and 20 grams of protein  Encourage adequate PO intake.   NUTRITION DIAGNOSIS:   Increased nutrient needs related to acute illness(COVID) as evidenced by estimated needs.  GOAL:   Patient will meet greater than or equal to 90% of their needs  MONITOR:   PO intake, Supplement acceptance, Skin, Weight trends, Labs, I & O's  REASON FOR ASSESSMENT:   Malnutrition Screening Tool    ASSESSMENT:   64 y.o. male with medical history significant for prostate cancer s/p proctectomy who presented as transfer from [facility] ED for bilateral PE. Pt COVID + on 4/7 with symptoms of cough and shortness of breath.  RD working remotely.  Pt unavailable during attempted time of contact. Meal completion has been 75%. Per MD, pt with decreased appetite. RD to order nutritional supplements to aid in caloric and protein needs.  Unable to complete Nutrition-Focused physical exam at this time.   Labs and medications reviewed.   Diet Order:   Diet Order            Diet Heart Room service appropriate? Yes; Fluid consistency: Thin  Diet effective now              EDUCATION NEEDS:   Not appropriate for education at this time  Skin:  Skin Assessment: Reviewed RN Assessment  Last BM:  Unknown  Height:   Ht Readings from Last 1 Encounters:  08/08/19 5\' 10"  (1.778 m)    Weight:   Wt Readings from Last 1 Encounters:  08/08/19 88.7 kg    BMI:  Body mass index is 28.05 kg/m.  Estimated Nutritional Needs:   Kcal:  2100-2300  Protein:  105-115 grams  Fluid:  >/= 2 L/day    Corrin Parker, MS, RD, LDN RD pager number/after hours weekend pager number on Amion.

## 2019-08-09 NOTE — Progress Notes (Signed)
Patient has been admitted to 5w02, Admissions paged. Patient is oriented to the room, bed alarm, and call bell.   Alert and oriented x4, on 3 L oxygen, heparin drip continued.

## 2019-08-09 NOTE — Progress Notes (Addendum)
ANTICOAGULATION CONSULT NOTE - Follow Up Consult  Pharmacy Consult for Heparin Indication: pulmonary embolus  No Known Allergies  Patient Measurements: Height: 5\' 10"  (177.8 cm) Weight: 88.7 kg (195 lb 8 oz) IBW/kg (Calculated) : 73 Heparin Dosing Weight: 88.7 kg  Vital Signs: Temp: 97.7 F (36.5 C) (04/22 0801) Temp Source: Oral (04/22 0801) BP: 107/79 (04/22 0801) Pulse Rate: 92 (04/22 0801)  Labs: Recent Labs    08/08/19 1717 08/08/19 1926 08/09/19 0759  HGB 15.1  --  13.8  HCT 43.7  --  40.6  PLT 314  --  313  LABPROT 14.4  --   --   INR 1.1  --   --   HEPARINUNFRC  --   --  0.11*  CREATININE 1.27*  --  1.42*  TROPONINIHS 13 15  --     Estimated Creatinine Clearance: 59.7 mL/min (A) (by C-G formula based on SCr of 1.42 mg/dL (H)).  Assessment:  64 yo M presents with new bilateral PE with RHS per CTA 08/08/19. Pharmacy asked to start heparin. Pt recently diagnosed with COVID on 4/7. D-dimer >20. No anticoagulantion noted PTA.    Initial heparin level is subtherapeutic (0.11) on 1500 units/hr.  Goal of Therapy:  Heparin level 0.3-0.7 units/ml Monitor platelets by anticoagulation protocol: Yes   Plan:   Heparin 3000 units IV bolus  Increase heparin drip to 1800 units/hr  Heparin level ~6 hrs after rate change.  Daily heparin level and CBC.  Arty Baumgartner, [place] Phone: [phone] 08/09/2019,9:06 AM  Addendum:    Heparin level is now therapeutic (0.40) on 1800 units/hr.    Will increase to 1900 units/hr to try to keep in target range.    Next heparin level and CBC in am.  Consuello Masse, RPh 08/09/2019 4:48 PM

## 2019-08-09 NOTE — Progress Notes (Signed)
Lower venous duplex       has been completed. Preliminary results can be found under CV proc through chart review. Emma Birchler, BS, RDMS, RVT   

## 2019-08-10 LAB — COMPREHENSIVE METABOLIC PANEL
ALT: 98 U/L — ABNORMAL HIGH (ref 0–44)
AST: 39 U/L (ref 15–41)
Albumin: 2.2 g/dL — ABNORMAL LOW (ref 3.5–5.0)
Alkaline Phosphatase: 117 U/L (ref 38–126)
Anion gap: 9 (ref 5–15)
BUN: 27 mg/dL — ABNORMAL HIGH (ref 8–23)
CO2: 21 mmol/L — ABNORMAL LOW (ref 22–32)
Calcium: 8.8 mg/dL — ABNORMAL LOW (ref 8.9–10.3)
Chloride: 108 mmol/L (ref 98–111)
Creatinine, Ser: 1.33 mg/dL — ABNORMAL HIGH (ref 0.61–1.24)
GFR calc Af Amer: >60 mL/min (ref >60)
GFR calc non Af Amer: 56 mL/min — ABNORMAL LOW (ref >60)
Glucose, Bld: 167 mg/dL — ABNORMAL HIGH (ref 70–99)
Potassium: 4.3 mmol/L (ref 3.5–5.1)
Sodium: 138 mmol/L (ref 135–145)
Total Bilirubin: 0.8 mg/dL (ref 0.3–1.2)
Total Protein: 5.9 g/dL — ABNORMAL LOW (ref 6.5–8.1)

## 2019-08-10 LAB — CBC
HCT: 40.8 % (ref 39.0–52.0)
Hemoglobin: 13.7 g/dL (ref 13.0–17.0)
MCH: 30.6 pg (ref 26.0–34.0)
MCHC: 33.6 g/dL (ref 30.0–36.0)
MCV: 91.3 fL (ref 80.0–100.0)
Platelets: 333 10*3/uL (ref 150–400)
RBC: 4.47 MIL/uL (ref 4.22–5.81)
RDW: 11.8 % (ref 11.5–15.5)
WBC: 20.6 10*3/uL — ABNORMAL HIGH (ref 4.0–10.5)
nRBC: 0 % (ref 0.0–0.2)

## 2019-08-10 LAB — C-REACTIVE PROTEIN: CRP: 17.5 mg/dL — ABNORMAL HIGH (ref <1.0)

## 2019-08-10 LAB — HEPARIN LEVEL (UNFRACTIONATED): Heparin Unfractionated: 0.46 IU/mL (ref 0.30–0.70)

## 2019-08-10 LAB — D-DIMER, QUANTITATIVE: D-Dimer, Quant: 9.6 ug/mL-FEU — ABNORMAL HIGH (ref 0.00–0.50)

## 2019-08-10 LAB — FERRITIN: Ferritin: 2320 ng/mL — ABNORMAL HIGH (ref 24–336)

## 2019-08-10 MED ORDER — RIVAROXABAN 20 MG PO TABS: 20.0000 mg | ORAL_TABLET | Freq: Every day | ORAL | Status: DC

## 2019-08-10 MED ORDER — RIVAROXABAN 15 MG PO TABS
15.0000 mg | ORAL_TABLET | Freq: Two times a day (BID) | ORAL | Status: DC
  Administered 2019-08-10 – 2019-08-13 (×6): 15 mg via ORAL
  Filled 2019-08-10 (×6): qty 1

## 2019-08-10 MED ORDER — FAMOTIDINE 20 MG PO TABS
20.0000 mg | ORAL_TABLET | Freq: Every day | ORAL | Status: DC
  Administered 2019-08-10 – 2019-08-13 (×4): 20 mg via ORAL
  Filled 2019-08-10 (×4): qty 1

## 2019-08-10 MED ORDER — CARVEDILOL 3.125 MG PO TABS
3.1250 mg | ORAL_TABLET | Freq: Two times a day (BID) | ORAL | Status: DC
  Administered 2019-08-11 – 2019-08-13 (×5): 3.125 mg via ORAL
  Filled 2019-08-10 (×6): qty 1

## 2019-08-10 NOTE — Progress Notes (Signed)
ANTICOAGULATION CONSULT NOTE - Follow Up Consult  Pharmacy Consult for Heparin Indication: pulmonary embolus and left leg DVT  No Known Allergies  Patient Measurements: Height: 5\' 10"  (177.8 cm) Weight: 88.7 kg (195 lb 8 oz) IBW/kg (Calculated) : 73 Heparin Dosing Weight: 88.7 kg  Vital Signs: Temp: 97.7 F (36.5 C) (04/23 1405) Temp Source: Axillary (04/23 1405) BP: 120/73 (04/23 1405) Pulse Rate: 90 (04/23 1405)  Labs: Recent Labs    08/08/19 1717 08/08/19 1717 08/08/19 1926 08/09/19 0759 08/09/19 1528 08/10/19 0544  HGB 15.1   < >  --  13.8  --  13.7  HCT 43.7  --   --  40.6  --  40.8  PLT 314  --   --  313  --  333  LABPROT 14.4  --   --   --   --   --   INR 1.1  --   --   --   --   --   HEPARINUNFRC  --   --   --  0.11* 0.40 0.46  CREATININE 1.27*  --   --  1.42*  --  1.33*  TROPONINIHS 13  --  15  --   --   --    < > = values in this interval not displayed.    Estimated Creatinine Clearance: 63.8 mL/min (A) (by C-G formula based on SCr of 1.33 mg/dL (H)).  Assessment:  64 yr old male presented with new bilateral PE with RHS, per CTA 08/08/19. Pharmacy consulted to start IV heparin. Pt was recently diagnosed with COVID on 4/7; d-dimer >20 on admission. Duplex showed L leg DVT. Pt was on no anticoagulantion PTA.  Heparin level was therapeutic (0.46 units/ml) earlier today on 1900 units/hr. CBC WNL. TBW CrCl >70 ml/min. Pharmacy is consulted to transition pt from IV heparin to Xarelto. Per RN, no issues with bleeding observed.  Goal of Therapy:  Heparin level 0.3-0.7 units/ml Monitor platelets by anticoagulation protocol: Yes   Plan:  Discontinue heparin infusion at 1730 today; at the same time, start Xarelto 15 mg PO BID X 21 days, followed by 20 mg PO daily Monitor CBC Monitor for signs/symptoms of bleeding  Gillermina Hu, PharmD, BCPS, [facility] Clinical Pharmacist 08/10/2019,4:46 PM

## 2019-08-10 NOTE — Progress Notes (Addendum)
PROGRESS NOTE                                                                                                                                                                                                             Patient Demographics:    Logan Harrison, is a 64 y.o. male, DOB - 11-04-1955, [id number]  Outpatient Primary MD for the patient is Practice, [place] date - 08/08/2019   LOS - 2  Chief Complaint  Patient presents with  . Chest Pain       Brief Narrative: Patient is a 64 y.o. male with PMHx of prostate cancer s/p prostatectomy, remote history of lower extremity DVT when he was in high school-recently diagnosed with COVID-19 on 4/7-presenting with cough, left-sided chest pain-further evaluation revealed acute hypoxemic respiratory failure secondary to pulmonary embolism and COVID-19 pneumonia.  Significant Events: 4/7>> COVID-19 positive 4/21>> admit to [facility] for hypoxemia secondary to PE and COVID-19 pneumonia 4/22>> lower extremity Doppler-positive for DVT 4/22>> Echo-EF 30-35%  COVID-19 medications: Steroids: 4/21>> Remdesivir: 4/21   Antibiotics: none  Microbiology data: 4/21: Blood cultures>> negative  DVT prophylaxis: IV heparin>> Xarelto  Procedures: None  Consults: None    Subjective:    Logan Harrison feels better-he was titrated down to 1.5 L of oxygen this morning.  He was on 3 L overnight.   Assessment  & Plan :   Acute Hypoxic Resp Failure due to Covid 19 Viral pneumonia and pulmonary embolism: Improving-decreasing oxygen requirement.  CRP downtrending although still significantly elevated.  Transthoracic echocardiogram without any evidence of RV failure.  Continue steroids/remdesivir-transition from IV heparin to Xarelto.  Note-patient has consented to the off label use of Actemra-if his hypoxemia worsens (if not felt to be from PE).  Risks/benefits and  rationale explained-he does not have a history of TB, hepatitis B or chronic diverticulitis.  Fever: afebrile  O2 requirements:  SpO2: 93 % O2 Flow Rate (L/min): 3 L/min   COVID-19 Labs: Recent Labs    08/08/19 1741 08/09/19 0759 08/10/19 0544  DDIMER >20.00* 15.08* 9.60*  FERRITIN 1,937* 2,256* 2,320*  LDH 355*  --   --   CRP 18.1* 20.2* 17.5*       Component Value Date/Time   BNP 109.9 (H) 08/08/2019 1717    Recent Labs  Lab 08/08/19 1741  PROCALCITON 0.17    Lab Results  Component Value Date   SARSCOV2NAA POSITIVE (A) 08/08/2019    Prone/Incentive Spirometry: encouraged  incentive spirometry use 3-4/hour.  Left lower extremity DVT with PE: See above.  Provoked by COVID-19.  Note this is his second episode of VTE-he has a remote history of lower extremity DVT when he was in high school (associated with injury when he played football)  AKI: Appears to be mild-likely hemodynamically mediated in setting of COVID-19 or secondary to contrast-induced nephropathy-follow.  Creatinine downtrending.  Transaminitis: Likely secondary to COVID-19-stable for follow-up without any further work-up.  Leukocytosis: Likely secondary to steroid use-clinically improving without any suggestion of bacterial infection.  Follow.  Newly diagnosed chronic systolic heart failure (EF 30-35% by echo on 4/22): He is euvolemic on exam-suspect this is a chronic finding and not related to COVID-19 infection.  Spoke with cardiology (Dr. Virgina Jock) who reviewed chart-he will arrange outpatient follow-up.  Start low-dose beta-blocker today.  LBBB: Per patient this is a chronic issue-he has a remote history of being referred to cardiology (approximately 10 years back) for this.  History of prostate cancer s/p prostatectomy  ABG: No results found for: PHART, PCO2ART, PO2ART, HCO3, TCO2, ACIDBASEDEF, O2SAT  Vent Settings: N/A  Condition -Stable  Family Communication  : Son updated over the phone  (RN at [facility] [facility] [employer] [facility]) on 4/23  Code Status :  Full Code   Diet :  Diet Order            Diet Heart Room service appropriate? Yes; Fluid consistency: Thin  Diet effective now               Disposition Plan  :   Status is: Inpatient  Remains inpatient appropriate because:Inpatient level of care appropriate due to severity of illness  Dispo: The patient is from: Home              Anticipated d/c is to: Home              Anticipated d/c date is: 3 days              Patient currently is not medically stable to d/c.  Barriers to discharge: Hypoxia requiring O2 supplementation/complete 5 days of IV Remdesivir  Antimicorbials  :    Anti-infectives (From admission, onward)   Start     Dose/Rate Route Frequency Ordered Stop   08/10/19 1000  remdesivir 100 mg in sodium chloride 0.9 % 100 mL IVPB     100 mg 200 mL/hr over 30 Minutes Intravenous Daily 08/09/19 0155 08/14/19 0959   08/09/19 0200  remdesivir 100 mg in sodium chloride 0.9 % 100 mL IVPB     100 mg 200 mL/hr over 30 Minutes Intravenous Every 30 min 08/09/19 0155 08/09/19 0413      Inpatient Medications  Scheduled Meds: . benzonatate  200 mg Oral TID  . feeding supplement (ENSURE ENLIVE)  237 mL Oral BID BM  . methylPREDNISolone (SOLU-MEDROL) injection  40 mg Intravenous Q12H   Continuous Infusions: . heparin 1,900 Units/hr (08/10/19 1347)  . remdesivir 100 mg in NS 100 mL 100 mg (08/10/19 0838)   PRN Meds:.acetaminophen, albuterol, chlorpheniramine-HYDROcodone, morphine injection, ondansetron (ZOFRAN) IV   Time Spent in minutes  35   See all Orders from today for further details   Oren Binet M.D on 08/10/2019 at 4:10 PM  To page go to [email or web address] - use universal password  [facility] -  Office  [phone]  Objective:   Vitals:   08/10/19 0411 08/10/19 0448 08/10/19 0800 08/10/19 1405  BP:  100/70 (!) 113/93 120/73  Pulse:  66 89 90  Resp:  18  18  Temp:  97.8 F (36.6 C) 98.1  F (36.7 C) 97.7 F (36.5 C)  TempSrc: Rectal Oral Oral Axillary  SpO2:  94% 95% 93%  Weight:      Height:        Wt Readings from Last 3 Encounters:  08/08/19 88.7 kg  08/05/19 86.2 kg     Intake/Output Summary (Last 24 hours) at 08/10/2019 1610 Last data filed at 08/10/2019 1347 Gross per 24 hour  Intake 1283.74 ml  Output --  Net 1283.74 ml     Physical Exam Gen Exam:Alert awake-not in any distress HEENT:atraumatic, normocephalic Chest: B/L clear to auscultation anteriorly CVS:S1S2 regular Abdomen:soft non tender, non distended Extremities:no edema Neurology: Non focal Skin: no rash   Data Review:    CBC Recent Labs  Lab 08/05/19 2037 08/08/19 1717 08/09/19 0759 08/10/19 0544  WBC 6.8 11.8* 13.7* 20.6*  HGB 16.3 15.1 13.8 13.7  HCT 48.3 43.7 40.6 40.8  PLT 247 314 313 333  MCV 91.8 91.2 91.4 91.3  MCH 31.0 31.5 31.1 30.6  MCHC 33.7 34.6 34.0 33.6  RDW 11.9 11.8 11.9 11.8  LYMPHSABS 1.5 1.0  --   --   MONOABS 0.7 1.4*  --   --   EOSABS 0.1 0.0  --   --   BASOSABS 0.0 0.0  --   --     Chemistries  Recent Labs  Lab 08/05/19 2037 08/08/19 1717 08/09/19 0759 08/10/19 0544  NA 137 133* 136 138  K 3.3* 4.0 4.5 4.3  CL 104 103 106 108  CO2 22 18* 20* 21*  GLUCOSE 129* 126* 120* 167*  BUN 34* 18 18 27*  CREATININE 1.74* 1.27* 1.42* 1.33*  CALCIUM 8.4* 8.7* 8.5* 8.8*  AST 119* 64* 51* 39  ALT 210* 134* 110* 98*  ALKPHOS 138* 146* 134* 117  BILITOT 0.5 2.2* 1.1 0.8   ------------------------------------------------------------------------------------------------------------------ Recent Labs    08/08/19 1741  TRIG 143    No results found for: HGBA1C ------------------------------------------------------------------------------------------------------------------ No results for input(s): TSH, T4TOTAL, T3FREE, THYROIDAB in the last 72 hours.  Invalid input(s):  FREET3 ------------------------------------------------------------------------------------------------------------------ Recent Labs    08/09/19 0759 08/10/19 0544  FERRITIN 2,256* 2,320*    Coagulation profile Recent Labs  Lab 08/08/19 1717  INR 1.1    Recent Labs    08/09/19 0759 08/10/19 0544  DDIMER 15.08* 9.60*    Cardiac Enzymes No results for input(s): CKMB, TROPONINI, MYOGLOBIN in the last 168 hours.  Invalid input(s): CK ------------------------------------------------------------------------------------------------------------------    Component Value Date/Time   BNP 109.9 (H) 08/08/2019 1717    Micro Results Recent Results (from the past 240 hour(s))  Blood Culture (routine x 2)     Status: None (Preliminary result)   Collection Time: 08/08/19  5:17 PM   Specimen: BLOOD  Result Value Ref Range Status   Specimen Description   Final    BLOOD RIGHT ANTECUBITAL Performed at [facility], [address] [place],  [address]    Special Requests   Final    BOTTLES DRAWN AEROBIC AND ANAEROBIC Blood Culture adequate volume Performed at [facility], [address] [place], [place] [address]    Culture   Final    NO GROWTH 2 DAYS Performed at [facility], [address] [place], [place] [address]    Report Status PENDING  Incomplete  Blood Culture (routine x 2)     Status: None (Preliminary result)   Collection Time: 08/08/19  5:41 PM   Specimen: BLOOD RIGHT HAND  Result Value Ref Range Status   Specimen Description   Final    BLOOD RIGHT HAND Performed at [facility], [place] [address], [place], [place] [address]    Special Requests   Final    BOTTLES DRAWN AEROBIC AND ANAEROBIC Blood Culture adequate volume Performed at [facility], [address] [place], [place] [address]    Culture   Final    NO GROWTH 2 DAYS Performed at [facility], [place] [address], [place], [place] [address]     Report Status PENDING  Incomplete  Respiratory Panel by RT PCR (Flu A&B, Covid) - Nasopharyngeal Swab     Status: Abnormal   Collection Time: 08/08/19  7:56 PM   Specimen: Nasopharyngeal Swab  Result Value Ref Range Status   SARS Coronavirus 2 by RT PCR POSITIVE (A) NEGATIVE Final    Comment: RESULT CALLED TO, READ BACK BY AND VERIFIED WITH: ADKINS,L AT 2100 ON [id number] BY CHERESNOWSKY,T (NOTE) SARS-CoV-2 target nucleic acids are DETECTED. SARS-CoV-2 RNA is generally detectable in upper respiratory specimens  during the acute phase of infection. Positive results are indicative of the presence of the identified virus, but do not rule out bacterial infection or co-infection with other pathogens not detected by the test. Clinical correlation with patient history and other diagnostic information is necessary to determine patient infection status. The expected result is Negative. Fact Sheet for Patients:  PinkCheek.be Fact Sheet for Healthcare Providers: GravelBags.it This test is not yet approved or cleared by the [place] FDA and  has been authorized for detection and/or diagnosis of SARS-CoV-2 by FDA under an Emergency Use Authorization (EUA).  This EUA will remain in effect (meaning this test can b e used) for the duration of  the COVID-19 declaration under Section 564(b)(1) of the Act, 21 U.S.C. section 360bbb-3(b)(1), unless the authorization is terminated or revoked sooner.    Influenza A by PCR NEGATIVE NEGATIVE Final   Influenza B by PCR NEGATIVE NEGATIVE Final    Comment: (NOTE) The Xpert Xpress SARS-CoV-2/FLU/RSV assay is intended as an aid in  the diagnosis of influenza from Nasopharyngeal swab specimens and  should not be used as a sole basis for treatment. Nasal washings and  aspirates are unacceptable for Xpert Xpress SARS-CoV-2/FLU/RSV  testing. Fact Sheet for  Patients: PinkCheek.be Fact Sheet for Healthcare Providers: GravelBags.it This test is not yet approved or cleared by the [place] FDA and  has been authorized for detection and/or diagnosis of SARS-CoV-2 by  FDA under an Emergency Use Authorization (EUA). This EUA will remain  in effect (meaning this test can be used) for the duration of the  Covid-19 declaration under Section 564(b)(1) of the Act, 21  U.S.C. section 360bbb-3(b)(1), unless the authorization is  terminated or revoked. Performed at [facility], [address] [place], [place] [address]     Radiology Reports CT Angio Chest PE W/Cm &/Or Wo Cm  Result Date: 08/08/2019 CLINICAL DATA:  64 year old male with chest pain and shortness of breath. Recent positive COVID-19 on 07/25/2019. EXAM: CT ANGIOGRAPHY CHEST WITH CONTRAST TECHNIQUE: Multidetector CT imaging of the chest was performed using the standard protocol during bolus administration of intravenous contrast. Multiplanar CT image  reconstructions and MIPs were obtained to evaluate the vascular anatomy. CONTRAST:  155mL OMNIPAQUE IOHEXOL 350 MG/ML SOLN COMPARISON:  Chest radiograph dated 08/08/2019 and CT of the abdomen pelvis dated 08/05/2019. FINDINGS: Cardiovascular: There is no cardiomegaly or pericardial effusion. There is straightening of the interventricular septum with near equal greatest ventricular lumen diameter each measuring approximately 4.3 cm and ratio of 1. Findings suggestive of a degree of right heart straining. The thoracic aorta is unremarkable. The origins of the great vessels of the aortic arch appear patent. Large bilateral central pulmonary artery emboli extending into the lobar and segmental branches. Mediastinum/Nodes: No hilar or mediastinal adenopathy. The esophagus and thyroid gland are grossly unremarkable. No mediastinal fluid collection. Lungs/Pleura: Bilateral confluent  airspace opacity primarily involving the peripheral and subpleural lungs most consistent with multifocal pneumonia and in keeping with COVID-19. There is no pleural effusion or pneumothorax. The central airways are patent. Upper Abdomen: Diffuse fatty liver. Cholecystectomy. Loose stool within the colon compatible with diarrheal state. Musculoskeletal: No chest wall abnormality. No acute or significant osseous findings. Review of the MIP images confirms the above findings. IMPRESSION: 1. Positive for acute PE with CTevidence of right heart strain (RV/LV Ratio = 1) consistent with at least submassive (intermediate risk) PE. The presence of right heart strain has been associated with an increased risk of morbidity and mortality. 2. Multifocal pneumonia and in keeping with COVID-19. 3. Fatty liver. 4. Diarrheal state. These results were called by telephone at the time of interpretation on 08/08/2019 at 7:31 pm to provider [facility] , who verbally acknowledged these results. Electronically Signed   By: Anner Crete M.D.   On: 08/08/2019 19:44   CT ABDOMEN PELVIS W CONTRAST  Result Date: 08/05/2019 CLINICAL DATA:  Diarrhea. Elevated liver enzymes and pancreatic enzymes. EXAM: CT ABDOMEN AND PELVIS WITH CONTRAST TECHNIQUE: Multidetector CT imaging of the abdomen and pelvis was performed using the standard protocol following bolus administration of intravenous contrast. CONTRAST:  72mL OMNIPAQUE IOHEXOL 300 MG/ML  SOLN COMPARISON:  None. FINDINGS: Lower chest: Patchy ground-glass airspace opacities dependently in the lower lobes as well as in the posterior lingula. Cannot exclude pneumonia or COVID pneumonia. No effusions. Heart is normal size. Hepatobiliary: Mild diffuse fatty infiltration of the liver. No focal hepatic abnormality. Prior cholecystectomy. Pancreas: No focal abnormality or ductal dilatation. Spleen: No focal abnormality.  Normal size. Adrenals/Urinary Tract: Atrophic left kidney. No renal or  adrenal mass. No hydronephrosis or stones. Urinary bladder unremarkable. Stomach/Bowel: Mildly prominent small bowel loops in the left upper quadrant. Other small bowel loops are fluid-filled. No well-defined focal transition zone. I favor this represents mild focal ileus. Stomach and large bowel unremarkable. Vascular/Lymphatic: No evidence of aneurysm or adenopathy. Scattered aortic atherosclerosis. Reproductive: No visible focal abnormality. Other: No free fluid or free air. Musculoskeletal: No acute bony abnormality. Degenerative facet disease in the lower lumbar spine. Associated slight anterolisthesis of L5 on S1. IMPRESSION: Mild diffuse fatty infiltration of the liver. Few prominent small bowel loops in the left upper quadrant, favor mild focal ileus rather than bowel obstruction. Aortic atherosclerosis. Prior cholecystectomy. Mildly atrophic left kidney. Electronically Signed   By: Rolm Baptise M.D.   On: 08/05/2019 22:58   DG Chest Port 1 View  Result Date: 08/08/2019 CLINICAL DATA:  Left chest pain and shortness of breath, recent COVID-19 positive 07/25/2019 EXAM: PORTABLE CHEST 1 VIEW COMPARISON:  12/28/2011, 08/05/2019 FINDINGS: Single frontal view of the chest demonstrates an unremarkable cardiac silhouette. There is patchy bilateral ground-glass airspace  disease, left greater than right. No effusion or pneumothorax. No acute bony abnormality. IMPRESSION: 1. Multifocal pneumonia, certainly compatible with COVID-19. Electronically Signed   By: Randa Ngo M.D.   On: 08/08/2019 17:52   ECHOCARDIOGRAM COMPLETE  Result Date: 08/09/2019    ECHOCARDIOGRAM REPORT   Patient Name:   CHUKWUMA TORNES Wauters Date of Exam: 08/09/2019 Medical Rec #:  [id number]        Height:       70.0 in Accession #:    [id number]       Weight:       195.5 lb Date of Birth:  April 13, 1956        BSA:          2.067 m Patient Age:    33 years         BP:           107/79 mmHg Patient Gender: M                HR:           95 bpm.  Exam Location:  Inpatient Procedure: 2D Echo, Cardiac Doppler and Color Doppler Indications:    Pulmonary embolism 415.19  History:        Patient has no prior history of Echocardiogram examinations.                 Risk Factors:Non-Smoker. Pulmonary embolism.  Sonographer:    Vickie Epley RDCS Referring Phys: [id number] Hendricks T TU  Sonographer Comments: Covid positive. IMPRESSIONS  1. Left ventricular ejection fraction, by estimation, is 30 to 35%. The left ventricle has moderately decreased function. The left ventricle demonstrates regional wall motion abnormalities with severe septal hypokinesis and septal-lateral dyssynchrony consistent with LBBB. There is mild left ventricular hypertrophy. Left ventricular diastolic parameters are consistent with Grade I diastolic dysfunction (impaired relaxation).  2. Right ventricular systolic function is normal. The right ventricular size is normal. Tricuspid regurgitation signal is inadequate for assessing PA pressure.  3. The mitral valve is normal in structure. No evidence of mitral valve regurgitation. No evidence of mitral stenosis.  4. The aortic valve is tricuspid. Aortic valve regurgitation is not visualized. No aortic stenosis is present.  5. The inferior vena cava is normal in size with <50% respiratory variability, suggesting right atrial pressure of 8 mmHg. FINDINGS  Left Ventricle: Left ventricular ejection fraction, by estimation, is 30 to 35%. The left ventricle has moderately decreased function. The left ventricle demonstrates regional wall motion abnormalities. The left ventricular internal cavity size was normal in size. There is mild left ventricular hypertrophy. Left ventricular diastolic parameters are consistent with Grade I diastolic dysfunction (impaired relaxation). Right Ventricle: The right ventricular size is normal. No increase in right ventricular wall thickness. Right ventricular systolic function is normal. Tricuspid regurgitation signal is  inadequate for assessing PA pressure. Left Atrium: Left atrial size was normal in size. Right Atrium: Right atrial size was normal in size. Pericardium: There is no evidence of pericardial effusion. Mitral Valve: The mitral valve is normal in structure. There is mild calcification of the mitral valve leaflet(s). No evidence of mitral valve regurgitation. No evidence of mitral valve stenosis. Tricuspid Valve: The tricuspid valve is normal in structure. Tricuspid valve regurgitation is not demonstrated. Aortic Valve: The aortic valve is tricuspid. Aortic valve regurgitation is not visualized. No aortic stenosis is present. Pulmonic Valve: The pulmonic valve was normal in structure. Pulmonic valve regurgitation is not visualized. Aorta: The aortic root is normal  in size and structure. Venous: The inferior vena cava is normal in size with less than 50% respiratory variability, suggesting right atrial pressure of 8 mmHg. IAS/Shunts: No atrial level shunt detected by color flow Doppler.  LEFT VENTRICLE PLAX 2D LVIDd:         4.60 cm LVIDs:         4.00 cm LV PW:         0.80 cm LV IVS:        0.80 cm LVOT diam:     2.20 cm LV SV:         67 LV SV Index:   33 LVOT Area:     3.80 cm  LV Volumes (MOD) LV vol d, MOD A2C: 192.0 ml LV vol d, MOD A4C: 171.0 ml LV vol s, MOD A2C: 131.0 ml LV vol s, MOD A4C: 119.0 ml LV SV MOD A2C:     61.0 ml LV SV MOD A4C:     171.0 ml LV SV MOD BP:      61.2 ml RIGHT VENTRICLE RV S prime:     10.40 cm/s TAPSE (M-mode): 1.7 cm LEFT ATRIUM             Index       RIGHT ATRIUM           Index LA diam:        3.20 cm 1.55 cm/m  RA Area:     10.60 cm LA Vol (A2C):   43.3 ml 20.95 ml/m RA Volume:   23.80 ml  11.51 ml/m LA Vol (A4C):   33.3 ml 16.11 ml/m LA Biplane Vol: 39.8 ml 19.25 ml/m  AORTIC VALVE LVOT Vmax:   107.00 cm/s LVOT Vmean:  65.400 cm/s LVOT VTI:    0.177 m  AORTA Ao Root diam: 3.50 cm Ao Asc diam:  3.50 cm  SHUNTS Systemic VTI:  0.18 m Systemic Diam: 2.20 cm Loralie Champagne MD  Electronically signed by Loralie Champagne MD Signature Date/Time: 08/09/2019/3:38:21 PM    Final    VAS [place] LOWER EXTREMITY VENOUS (DVT)  Result Date: 08/09/2019  Lower Venous DVTStudy Indications: Pulmonary embolism, and COVID.  Comparison Study: no prior Performing Technologist: June Leap RDMS, RVT  Examination Guidelines: A complete evaluation includes B-mode imaging, spectral Doppler, color Doppler, and power Doppler as needed of all accessible portions of each vessel. Bilateral testing is considered an integral part of a complete examination. Limited examinations for reoccurring indications may be performed as noted. The reflux portion of the exam is performed with the patient in reverse Trendelenburg.  +---------+---------------+---------+-----------+----------+--------------+ RIGHT    CompressibilityPhasicitySpontaneityPropertiesThrombus Aging +---------+---------------+---------+-----------+----------+--------------+ CFV      Full           Yes      Yes                                 +---------+---------------+---------+-----------+----------+--------------+ SFJ      Full                                                        +---------+---------------+---------+-----------+----------+--------------+ FV Prox  Full                                                        +---------+---------------+---------+-----------+----------+--------------+  FV Mid   Full                                                        +---------+---------------+---------+-----------+----------+--------------+ FV DistalFull                                                        +---------+---------------+---------+-----------+----------+--------------+ PFV      Full                                                        +---------+---------------+---------+-----------+----------+--------------+ POP      Full           Yes      Yes                                  +---------+---------------+---------+-----------+----------+--------------+ PTV      Full                                                        +---------+---------------+---------+-----------+----------+--------------+ PERO     Full                                                        +---------+---------------+---------+-----------+----------+--------------+   +---------+---------------+---------+-----------+----------+--------------+ LEFT     CompressibilityPhasicitySpontaneityPropertiesThrombus Aging +---------+---------------+---------+-----------+----------+--------------+ CFV      Full           Yes      Yes                                 +---------+---------------+---------+-----------+----------+--------------+ SFJ      Full                                                        +---------+---------------+---------+-----------+----------+--------------+ FV Prox  Full                                                        +---------+---------------+---------+-----------+----------+--------------+ FV Mid   Full                                                        +---------+---------------+---------+-----------+----------+--------------+  FV DistalFull                                                        +---------+---------------+---------+-----------+----------+--------------+ PFV      Full                                                        +---------+---------------+---------+-----------+----------+--------------+ POP      Full           Yes      Yes                                 +---------+---------------+---------+-----------+----------+--------------+ PTV      None                                         Acute          +---------+---------------+---------+-----------+----------+--------------+ PERO     None                                         Acute           +---------+---------------+---------+-----------+----------+--------------+     Summary: RIGHT: - There is no evidence of deep vein thrombosis in the lower extremity.  - No cystic structure found in the popliteal fossa.  LEFT: - Findings consistent with acute deep vein thrombosis involving the left posterior tibial veins, and left peroneal veins. - No cystic structure found in the popliteal fossa.  *See table(s) above for measurements and observations. Electronically signed by Monica Martinez MD on 08/09/2019 at 8:10:21 PM.    Final

## 2019-08-10 NOTE — Progress Notes (Signed)
ANTICOAGULATION CONSULT NOTE - Follow Up Consult  Pharmacy Consult for Heparin Indication: pulmonary embolus and left leg DVT  No Known Allergies  Patient Measurements: Height: 5\' 10"  (177.8 cm) Weight: 88.7 kg (195 lb 8 oz) IBW/kg (Calculated) : 73 Heparin Dosing Weight: 88.7 kg  Vital Signs: Temp: 97.8 F (36.6 C) (04/23 0448) Temp Source: Oral (04/23 0448) BP: 100/70 (04/23 0448) Pulse Rate: 66 (04/23 0448)  Labs: Recent Labs    08/08/19 1717 08/08/19 1717 08/08/19 1926 08/09/19 0759 08/09/19 1528 08/10/19 0544  HGB 15.1   < >  --  13.8  --  13.7  HCT 43.7  --   --  40.6  --  40.8  PLT 314  --   --  313  --  333  LABPROT 14.4  --   --   --   --   --   INR 1.1  --   --   --   --   --   HEPARINUNFRC  --   --   --  0.11* 0.40 0.46  CREATININE 1.27*  --   --  1.42*  --  1.33*  TROPONINIHS 13  --  15  --   --   --    < > = values in this interval not displayed.    Estimated Creatinine Clearance: 63.8 mL/min (A) (by C-G formula based on SCr of 1.33 mg/dL (H)).  Assessment:  64 yo M presents with new bilateral PE with RHS per CTA 08/08/19. Pharmacy asked to start IV heparin. Pt recently diagnosed with COVID on 4/7. D-dimer >20 on admit. No anticoagulantion noted PTA.    Heparin level is therapeutic (0.46) on 1900 units/hr.   Dulpex shows left leg DVT.   Goal of Therapy:  Heparin level 0.3-0.7 units/ml Monitor platelets by anticoagulation protocol: Yes   Plan:   Continue heparin drip at 1900 units/hr  Daily heparin level and CBC.  Arty Baumgartner, [place] Phone: [phone] 08/10/2019,10:31 AM

## 2019-08-10 NOTE — Progress Notes (Signed)
SATURATION QUALIFICATIONS: (This note is used to comply with regulatory documentation for home oxygen)  Patient Saturations on Room Air at Rest = 94%  Patient Saturations on Room Air while Ambulating = 90%    Please briefly explain why patient needs home oxygen:pt does not qualify for home oxygen

## 2019-08-11 DIAGNOSIS — J9600 Acute respiratory failure, unspecified whether with hypoxia or hypercapnia: Secondary | ICD-10-CM

## 2019-08-11 DIAGNOSIS — I2699 Other pulmonary embolism without acute cor pulmonale: Secondary | ICD-10-CM

## 2019-08-11 DIAGNOSIS — U071 COVID-19: Principal | ICD-10-CM

## 2019-08-11 DIAGNOSIS — J1282 Pneumonia due to coronavirus disease 2019: Secondary | ICD-10-CM

## 2019-08-11 LAB — COMPREHENSIVE METABOLIC PANEL
ALT: 168 U/L — ABNORMAL HIGH (ref 0–44)
AST: 94 U/L — ABNORMAL HIGH (ref 15–41)
Albumin: 2.1 g/dL — ABNORMAL LOW (ref 3.5–5.0)
Alkaline Phosphatase: 123 U/L (ref 38–126)
Anion gap: 9 (ref 5–15)
BUN: 34 mg/dL — ABNORMAL HIGH (ref 8–23)
CO2: 21 mmol/L — ABNORMAL LOW (ref 22–32)
Calcium: 8.8 mg/dL — ABNORMAL LOW (ref 8.9–10.3)
Chloride: 109 mmol/L (ref 98–111)
Creatinine, Ser: 1.2 mg/dL (ref 0.61–1.24)
GFR calc Af Amer: >60 mL/min (ref >60)
GFR calc non Af Amer: >60 mL/min (ref >60)
Glucose, Bld: 155 mg/dL — ABNORMAL HIGH (ref 70–99)
Potassium: 4.4 mmol/L (ref 3.5–5.1)
Sodium: 139 mmol/L (ref 135–145)
Total Bilirubin: 0.6 mg/dL (ref 0.3–1.2)
Total Protein: 5.7 g/dL — ABNORMAL LOW (ref 6.5–8.1)

## 2019-08-11 LAB — BRAIN NATRIURETIC PEPTIDE: B Natriuretic Peptide: 93.5 pg/mL (ref 0.0–100.0)

## 2019-08-11 LAB — CBC
HCT: 38.6 % — ABNORMAL LOW (ref 39.0–52.0)
Hemoglobin: 13.1 g/dL (ref 13.0–17.0)
MCH: 30.9 pg (ref 26.0–34.0)
MCHC: 33.9 g/dL (ref 30.0–36.0)
MCV: 91 fL (ref 80.0–100.0)
Platelets: 380 10*3/uL (ref 150–400)
RBC: 4.24 MIL/uL (ref 4.22–5.81)
RDW: 11.8 % (ref 11.5–15.5)
WBC: 21.9 10*3/uL — ABNORMAL HIGH (ref 4.0–10.5)
nRBC: 0 % (ref 0.0–0.2)

## 2019-08-11 LAB — FERRITIN: Ferritin: 2156 ng/mL — ABNORMAL HIGH (ref 24–336)

## 2019-08-11 LAB — PROCALCITONIN: Procalcitonin: 0.78 ng/mL

## 2019-08-11 LAB — C-REACTIVE PROTEIN: CRP: 9.1 mg/dL — ABNORMAL HIGH (ref <1.0)

## 2019-08-11 LAB — MAGNESIUM: Magnesium: 2 mg/dL (ref 1.7–2.4)

## 2019-08-11 LAB — D-DIMER, QUANTITATIVE: D-Dimer, Quant: 7.11 ug/mL-FEU — ABNORMAL HIGH (ref 0.00–0.50)

## 2019-08-11 MED ORDER — MORPHINE SULFATE (PF) 2 MG/ML IV SOLN: 1.0000 mg | INTRAVENOUS | Status: DC | PRN

## 2019-08-11 MED ORDER — ACETAMINOPHEN 500 MG PO TABS
500.0000 mg | ORAL_TABLET | Freq: Two times a day (BID) | ORAL | Status: DC
  Administered 2019-08-11 – 2019-08-13 (×5): 500 mg via ORAL
  Filled 2019-08-11 (×5): qty 1

## 2019-08-11 MED ORDER — PRO-STAT SUGAR FREE PO LIQD: 30.0000 mL | Freq: Three times a day (TID) | ORAL | Status: DC

## 2019-08-11 MED ORDER — TRAMADOL HCL 50 MG PO TABS
50.0000 mg | ORAL_TABLET | Freq: Four times a day (QID) | ORAL | Status: DC | PRN
  Administered 2019-08-12: 13:00:00 50 mg via ORAL
  Filled 2019-08-11: qty 1

## 2019-08-11 MED ORDER — ACETAMINOPHEN 500 MG PO TABS
500.0000 mg | ORAL_TABLET | Freq: Two times a day (BID) | ORAL | Status: DC | PRN
  Administered 2019-08-12: 500 mg via ORAL
  Filled 2019-08-11: qty 1

## 2019-08-11 NOTE — Progress Notes (Signed)
Occupational Therapy Evaluation  This 64 y/o male presents with the below. PTA pt reports living alone and reports independence with ADL, iADL and functional mobility. Pt overall performing room level mobility and ADL tasks at supervision level without AD (tolerating standing at sink approx 5 min for ADL completion); pt requiring minguard assist for hallway level mobility given mild dyspnea with prolonged activity, though overall tolerating session well. Pt on RA throughout session with SpO2 >/=91%. He will benefit from continued acute OT services to maximize his overall endurance, safety and independence with ADL and mobility. Will follow.      08/11/19 0800  OT Visit Information  Last OT Received On 08/11/19  Assistance Needed +1  History of Present Illness Patient is a 64 y.o. male with PMHx of prostate cancer s/p prostatectomy, remote history of lower extremity DVT when he was in high school-recently diagnosed with COVID-19 on 4/7-presenting with cough, left-sided chest pain-further evaluation revealed acute hypoxemic respiratory failure secondary to pulmonary embolism and COVID-19 pneumonia.  Precautions  Precautions None  Restrictions  Weight Bearing Restrictions No  Home Living  Family/patient expects to be discharged to: Private residence  Living Arrangements Alone  Available Help at Discharge Family;Available PRN/intermittently (son lives close by, works as an Therapist, sports in [employer])  Type of [place] to enter  [employer] of [address]  [place] level;Able to live on main level with bedroom/bathroom (only uses second story for storage)  Bathroom Therapist, music None  Additional Comments son lives nearby, pt typically assists with taking care of his mother   Prior Function  Level of Independence Independent  Comments driving, 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, petroleum No difficulties (slight HOH)  Pain Assessment  Pain Assessment No/denies pain  Cognition  Arousal/Alertness Awake/alert  Behavior During Therapy WFL for tasks assessed/performed  Overall Cognitive Status Within Functional Limits for tasks assessed  Upper Extremity Assessment  Upper Extremity Assessment Overall WFL for tasks assessed  Lower Extremity Assessment  Lower Extremity Assessment Defer to PT evaluation  Cervical / Trunk Assessment  Cervical / Trunk Assessment Normal  ADL  Overall ADL's  Needs assistance/impaired  Eating/Feeding Modified independent;Sitting  Grooming Oral care;Wash/dry hands;Supervision/safety;Standing  Upper Body Bathing Modified independent;Sitting  Lower Body Bathing Supervison/ safety;Sit to/from stand  Upper Body Dressing  Modified independent;Sitting  Lower Body Dressing Supervision/safety;Sit to/from Arboriculturist;Ambulation  Toileting- Water quality scientist and Hygiene Supervision/safety;Sit to/from stand  Functional mobility during ADLs Supervision/safety;Min guard (minguard in hallway, supervision in room)  Bed Mobility  Overal bed mobility Modified Independent  General bed mobility comments HOB elevated  Transfers  Overall transfer level Needs assistance  Equipment used None  Transfers Sit to/from Stand  Sit to Stand Supervision  General transfer comment for lines  Balance  Overall balance assessment Mild deficits observed, not formally tested  Exercises  Exercises Other exercises  Other Exercises  Other Exercises flutter valve x10  Other Exercises IS x10, pt pulling to 2042ml  OT - End of Session  Activity Tolerance Patient tolerated treatment well  Patient left in chair;with call bell/phone within reach  Nurse Communication Mobility status  OT Assessment  OT Recommendation/Assessment Patient needs continued OT Services  OT Visit Diagnosis Muscle weakness  (generalized) (M62.81);Other (comment) (decreased activity tolerance)  OT Problem List Decreased activity tolerance;Cardiopulmonary status limiting activity;Decreased knowledge of use of  DME or AE  OT Plan  OT Frequency (ACUTE ONLY) Min 2X/week  OT Treatment/Interventions (ACUTE ONLY) Self-care/ADL training;Therapeutic exercise;Energy conservation;DME and/or AE instruction;Therapeutic activities;Patient/family education;Balance training  AM-PAC OT "6 Clicks" Daily Activity Outcome Measure (Version 2)  Help from another person eating meals? 4  Help from another person taking care of personal grooming? 3  Help from another person toileting, which includes using toliet, bedpan, or urinal? 3  Help from another person bathing (including washing, rinsing, drying)? 3  Help from another person to put on and taking off regular upper body clothing? 4  Help from another person to put on and taking off regular lower body clothing? 3  6 Click Score 20  OT Recommendation  Follow Up Recommendations No OT follow up;Supervision - Intermittent  OT Equipment None recommended by OT  Individuals Consulted  Consulted and Agree with Results and Recommendations Patient  Acute Rehab OT Goals  Patient Stated Goal home when able  OT Goal Formulation With patient  Time For Goal Achievement 08/25/19  Potential to Achieve Goals Good  OT Time Calculation  OT Start Time (ACUTE ONLY) 0910  OT Stop Time (ACUTE ONLY) 0942  OT Time Calculation (min) 32 min  OT General Charges  $OT Visit 1 Visit  OT Evaluation  $OT Eval Moderate Complexity 1 Mod  OT Treatments  $Self Care/Home Management  8-22 mins   Lou Cal, OT Acute Rehabilitation Services Pager [phone] Office [phone]

## 2019-08-11 NOTE — Progress Notes (Addendum)
PROGRESS NOTE                                                                                                                                                                                                             Patient Demographics:    Logan Harrison, is a 64 y.o. male, DOB - 1955-08-07, [id number]  Outpatient Primary MD for the patient is Practice, [place] date - 08/08/2019   LOS - 3  Chief Complaint  Patient presents with  . Chest Pain       Brief Narrative: Patient is a 64 y.o. male with PMHx of prostate cancer s/p prostatectomy, remote history of lower extremity DVT when he was in high school-recently diagnosed with COVID-19 on 4/7-presenting with cough, left-sided chest pain-further evaluation revealed acute hypoxemic respiratory failure secondary to pulmonary embolism and COVID-19 pneumonia.  Significant Events: 4/7>> COVID-19 positive 4/21>> admit to [facility] for hypoxemia secondary to PE and COVID-19 pneumonia 4/22>> lower extremity Doppler-positive for DVT 4/22>> Echo-EF 30-35%  COVID-19 medications: Steroids: 4/21>> Remdesivir: 4/21   Antibiotics: none  Microbiology data: 4/21: Blood cultures>> negative  DVT prophylaxis:  IV heparin>> Xarelto  Procedures: None  Consults: None    Subjective:    Patient in bed, appears comfortable, denies any headache, no fever, no chest pain or pressure, no shortness of breath , no abdominal pain. No focal weakness.   Assessment  & Plan :   Acute Hypoxic Resp Failure due to Covid 19 Viral pneumonia and pulmonary embolism:  He had moderate to severe disease in the setting of acute [id number] pneumonia complicated by acute PE.  He is appropriately being treated with IV steroids and remdesivir and is gradually improving, continue anticoagulation.  He has been encouraged to sit up in chair in the daytime and use I-S and flutter valve for  pulmonary toiletry, at nighttime prone in bed if needed.  New to advance activity and titrate down oxygen.   SpO2: 90 % O2 Flow Rate (L/min): 3 L/min    Recent Labs  Lab 08/05/19 2037 08/08/19 1717 08/08/19 1741 08/09/19 0759 08/10/19 0544 08/11/19 0235  NA 137 133*  --  136 138 139  K 3.3* 4.0  --  4.5 4.3 4.4  CL 104 103  --  106 108 109  CO2 22 18*  --  20* 21* 21*  GLUCOSE 129* 126*  --  120* 167* 155*  BUN 34* 18  --  18 27* 34*  CREATININE 1.74* 1.27*  --  1.42* 1.33* 1.20  CALCIUM 8.4* 8.7*  --  8.5* 8.8* 8.8*  AST 119* 64*  --  51* 39 94*  ALT 210* 134*  --  110* 98* 168*  ALKPHOS 138* 146*  --  134* 117 123  BILITOT 0.5 2.2*  --  1.1 0.8 0.6  ALBUMIN 3.2* 3.1*  --  2.3* 2.2* 2.1*  MG  --   --   --   --   --  2.0  CRP  --   --  18.1* 20.2* 17.5* 9.1*  DDIMER  --   --  >20.00* 15.08* 9.60* 7.11*  PROCALCITON  --   --  0.17  --   --  0.78  INR  --  1.1  --   --   --   --   BNP  --  109.9*  --   --   --   --     Recent Labs  Lab 08/08/19 1717 08/08/19 1741 08/08/19 1956 08/09/19 0759 08/10/19 0544 08/11/19 0235  CRP  --  18.1*  --  20.2* 17.5* 9.1*  DDIMER  --  >20.00*  --  15.08* 9.60* 7.11*  BNP 109.9*  --   --   --   --   --   PROCALCITON  --  0.17  --   --   --  0.78  SARSCOV2NAA  --   --  POSITIVE*  --   --   --         Left lower extremity DVT with PE: Has history of previously provoked DVT as well, currently on Xarelto and stable.  AKI: Due to dehydration resolved with IV fluids.  Transaminitis: Mild asymptomatic with good liver synthetic function, INR 1.1 upon admission.  This is due to viral infection, stable will trend.  Leukocytosis: Likely secondary to steroid use-clinically improving without any suggestion of bacterial infection.  Follow.  Newly diagnosed chronic systolic heart failure (EF 30-35% by echo on 4/22): He is euvolemic on exam-suspect this is a chronic finding and not related to COVID-19 infection.  Spoke with cardiology (Dr.  Virgina Jock) who reviewed chart-he will arrange outpatient follow-up.  Agree with low-dose beta-blocker.  LBBB: Per patient this is a chronic issue-he has a remote history of being referred to cardiology (approximately 10 years back) for this.  History of prostate cancer s/p prostatectomy - no acute issues follow with PCP.     Condition - Stable  Family Communication  : Son [name]  (RN at [employer]) [phone] on 08/11/18.  Code Status :  Full Code   Diet :  Diet Order            Diet Heart Room service appropriate? Yes; Fluid consistency: Thin  Diet effective now               Disposition Plan  : Remain inpatient in the hospital, finished treatment for acute COVID-19 pneumonia with IV remdesivir, also has acute PE requiring anticoagulation.   Antimicorbials  :    Anti-infectives (From admission, onward)   Start     Dose/Rate Route Frequency Ordered Stop   08/10/19 1000  remdesivir 100 mg in sodium chloride 0.9 % 100 mL IVPB     100 mg 200 mL/hr over 30 Minutes Intravenous Daily 08/09/19 0155 08/14/19 0959   08/09/19  0200  remdesivir 100 mg in sodium chloride 0.9 % 100 mL IVPB     100 mg 200 mL/hr over 30 Minutes Intravenous Every 30 min 08/09/19 0155 08/09/19 0413      Inpatient Medications  Scheduled Meds: . acetaminophen  500 mg Oral BID  . benzonatate  200 mg Oral TID  . carvedilol  3.125 mg Oral BID WC  . famotidine  20 mg Oral Daily  . feeding supplement (ENSURE ENLIVE)  237 mL Oral BID BM  . feeding supplement (PRO-STAT SUGAR FREE 64)  30 mL Oral TID WC  . methylPREDNISolone (SOLU-MEDROL) injection  40 mg Intravenous Q12H  . Rivaroxaban  15 mg Oral BID WC   Followed by  . [START ON 08/31/2019] rivaroxaban  20 mg Oral Q supper   Continuous Infusions: . remdesivir 100 mg in NS 100 mL 100 mg (08/10/19 0838)   PRN Meds:.acetaminophen, albuterol, chlorpheniramine-HYDROcodone, morphine injection, ondansetron (ZOFRAN) IV, traMADol   Time Spent in minutes   35   See all Orders from today for further details   Lala Lund M.D on 08/11/2019 at 9:54 AM  To page go to [email or web address] - use universal password  [facility] -  Office  [phone]    Objective:   Vitals:   08/10/19 2036 08/11/19 0410 08/11/19 0411 08/11/19 0816  BP: 117/82  111/67 114/62  Pulse: 78  82   Resp: 18  16   Temp:  97.8 F (36.6 C)    TempSrc:      SpO2: 95%  90%   Weight:      Height:        Wt Readings from Last 3 Encounters:  08/08/19 88.7 kg  08/05/19 86.2 kg     Intake/Output Summary (Last 24 hours) at 08/11/2019 0954 Last data filed at 08/11/2019 0414 Gross per 24 hour  Intake 360 ml  Output 1 ml  Net 359 ml     Physical Exam  Awake Alert, No new F.N deficits, Normal affect [place].AT,PERRAL Supple Neck,No JVD, No cervical lymphadenopathy appriciated.  Symmetrical Chest wall movement, Good air movement bilaterally, CTAB RRR,No Gallops, Rubs or new Murmurs, No Parasternal Heave +ve B.Sounds, Abd Soft, No tenderness, No organomegaly appriciated, No rebound - guarding or rigidity. No Cyanosis, Clubbing or edema, No new Rash or bruise    Data Review:    CBC Recent Labs  Lab 08/05/19 2037 08/08/19 1717 08/09/19 0759 08/10/19 0544 08/11/19 0235  WBC 6.8 11.8* 13.7* 20.6* 21.9*  HGB 16.3 15.1 13.8 13.7 13.1  HCT 48.3 43.7 40.6 40.8 38.6*  PLT 247 314 313 333 380  MCV 91.8 91.2 91.4 91.3 91.0  MCH 31.0 31.5 31.1 30.6 30.9  MCHC 33.7 34.6 34.0 33.6 33.9  RDW 11.9 11.8 11.9 11.8 11.8  LYMPHSABS 1.5 1.0  --   --   --   MONOABS 0.7 1.4*  --   --   --   EOSABS 0.1 0.0  --   --   --   BASOSABS 0.0 0.0  --   --   --     Chemistries  Recent Labs  Lab 08/05/19 2037 08/08/19 1717 08/09/19 0759 08/10/19 0544 08/11/19 0235  NA 137 133* 136 138 139  K 3.3* 4.0 4.5 4.3 4.4  CL 104 103 106 108 109  CO2 22 18* 20* 21* 21*  GLUCOSE 129* 126* 120* 167* 155*  BUN 34* 18 18 27* 34*  CREATININE 1.74* 1.27* 1.42* 1.33* 1.20    CALCIUM 8.4*  8.7* 8.5* 8.8* 8.8*  MG  --   --   --   --  2.0  AST 119* 64* 51* 39 94*  ALT 210* 134* 110* 98* 168*  ALKPHOS 138* 146* 134* 117 123  BILITOT 0.5 2.2* 1.1 0.8 0.6   ------------------------------------------------------------------------------------------------------------------ Recent Labs    08/08/19 1741  TRIG 143    No results found for: HGBA1C ------------------------------------------------------------------------------------------------------------------ No results for input(s): TSH, T4TOTAL, T3FREE, THYROIDAB in the last 72 hours.  Invalid input(s): FREET3 ------------------------------------------------------------------------------------------------------------------ Recent Labs    08/10/19 0544 08/11/19 0235  FERRITIN 2,320* 2,156*    Coagulation profile Recent Labs  Lab 08/08/19 1717  INR 1.1    Recent Labs    08/10/19 0544 08/11/19 0235  DDIMER 9.60* 7.11*    Cardiac Enzymes No results for input(s): CKMB, TROPONINI, MYOGLOBIN in the last 168 hours.  Invalid input(s): CK ------------------------------------------------------------------------------------------------------------------    Component Value Date/Time   BNP 109.9 (H) 08/08/2019 1717    Micro Results Recent Results (from the past 240 hour(s))  Blood Culture (routine x 2)     Status: None (Preliminary result)   Collection Time: 08/08/19  5:17 PM   Specimen: BLOOD  Result Value Ref Range Status   Specimen Description   Final    BLOOD RIGHT ANTECUBITAL Performed at [facility], [address] [place], [place] [address]    Special Requests   Final    BOTTLES DRAWN AEROBIC AND ANAEROBIC Blood Culture adequate volume Performed at [facility], [address] [place], [place] [address]    Culture   Final    NO GROWTH 2 DAYS Performed at [facility], [place] [address], [place], [place] [address]    Report Status PENDING  Incomplete  Blood  Culture (routine x 2)     Status: None (Preliminary result)   Collection Time: 08/08/19  5:41 PM   Specimen: BLOOD RIGHT HAND  Result Value Ref Range Status   Specimen Description   Final    BLOOD RIGHT HAND Performed at [facility], [place] [address], [place], [place] [address]    Special Requests   Final    BOTTLES DRAWN AEROBIC AND ANAEROBIC Blood Culture adequate volume Performed at [facility], [address] [place], [place] [address]    Culture   Final    NO GROWTH 2 DAYS Performed at [facility], [place] [address] [place], [place] [address]    Report Status PENDING  Incomplete  Respiratory Panel by RT PCR (Flu A&B, Covid) - Nasopharyngeal Swab     Status: Abnormal   Collection Time: 08/08/19  7:56 PM   Specimen: Nasopharyngeal Swab  Result Value Ref Range Status   SARS Coronavirus 2 by RT PCR POSITIVE (A) NEGATIVE Final    Comment: RESULT CALLED TO, READ BACK BY AND VERIFIED WITH: ADKINS,L AT 2100 ON [id number] BY CHERESNOWSKY,T (NOTE) SARS-CoV-2 target nucleic acids are DETECTED. SARS-CoV-2 RNA is generally detectable in upper respiratory specimens  during the acute phase of infection. Positive results are indicative of the presence of the identified virus, but do not rule out bacterial infection or co-infection with other pathogens not detected by the test. Clinical correlation with patient history and other diagnostic information is necessary to determine patient infection status. The expected result is Negative. Fact Sheet for Patients:  PinkCheek.be Fact Sheet for Healthcare Providers: GravelBags.it This test is not yet approved or cleared by the [place]  FDA and  has been authorized for detection and/or diagnosis of SARS-CoV-2 by FDA under an Emergency Use Authorization (EUA).  This EUA will remain in effect (meaning this test can b e used) for the duration of  the COVID-19  declaration under Section 564(b)(1) of the Act, 21 U.S.C. section 360bbb-3(b)(1), unless the authorization is terminated or revoked sooner.    Influenza A by PCR NEGATIVE NEGATIVE Final   Influenza B by PCR NEGATIVE NEGATIVE Final    Comment: (NOTE) The Xpert Xpress SARS-CoV-2/FLU/RSV assay is intended as an aid in  the diagnosis of influenza from Nasopharyngeal swab specimens and  should not be used as a sole basis for treatment. Nasal washings and  aspirates are unacceptable for Xpert Xpress SARS-CoV-2/FLU/RSV  testing. Fact Sheet for Patients: PinkCheek.be Fact Sheet for Healthcare Providers: GravelBags.it This test is not yet approved or cleared by the [place] FDA and  has been authorized for detection and/or diagnosis of SARS-CoV-2 by  FDA under an Emergency Use Authorization (EUA). This EUA will remain  in effect (meaning this test can be used) for the duration of the  Covid-19 declaration under Section 564(b)(1) of the Act, 21  U.S.C. section 360bbb-3(b)(1), unless the authorization is  terminated or revoked. Performed at [facility], [address] [place], [place] [address]     Radiology Reports CT Angio Chest PE W/Cm &/Or Wo Cm  Result Date: 08/08/2019 CLINICAL DATA:  64 year old male with chest pain and shortness of breath. Recent positive COVID-19 on 07/25/2019. EXAM: CT ANGIOGRAPHY CHEST WITH CONTRAST TECHNIQUE: Multidetector CT imaging of the chest was performed using the standard protocol during bolus administration of intravenous contrast. Multiplanar CT image reconstructions and MIPs were obtained to evaluate the vascular anatomy. CONTRAST:  159mL OMNIPAQUE IOHEXOL 350 MG/ML SOLN COMPARISON:  Chest radiograph dated 08/08/2019 and CT of the abdomen pelvis dated 08/05/2019. FINDINGS: Cardiovascular: There is no cardiomegaly or pericardial effusion. There is straightening of the interventricular  septum with near equal greatest ventricular lumen diameter each measuring approximately 4.3 cm and ratio of 1. Findings suggestive of a degree of right heart straining. The thoracic aorta is unremarkable. The origins of the great vessels of the aortic arch appear patent. Large bilateral central pulmonary artery emboli extending into the lobar and segmental branches. Mediastinum/Nodes: No hilar or mediastinal adenopathy. The esophagus and thyroid gland are grossly unremarkable. No mediastinal fluid collection. Lungs/Pleura: Bilateral confluent airspace opacity primarily involving the peripheral and subpleural lungs most consistent with multifocal pneumonia and in keeping with COVID-19. There is no pleural effusion or pneumothorax. The central airways are patent. Upper Abdomen: Diffuse fatty liver. Cholecystectomy. Loose stool within the colon compatible with diarrheal state. Musculoskeletal: No chest wall abnormality. No acute or significant osseous findings. Review of the MIP images confirms the above findings. IMPRESSION: 1. Positive for acute PE with CTevidence of right heart strain (RV/LV Ratio = 1) consistent with at least submassive (intermediate risk) PE. The presence of right heart strain has been associated with an increased risk of morbidity and mortality. 2. Multifocal pneumonia and in keeping with COVID-19. 3. Fatty liver. 4. Diarrheal state. These results were called by telephone at the time of interpretation on 08/08/2019 at 7:31 pm to provider [facility] , who verbally acknowledged these results. Electronically Signed   By: Anner Crete M.D.   On: 08/08/2019 19:44   CT ABDOMEN PELVIS W CONTRAST  Result Date: 08/05/2019 CLINICAL DATA:  Diarrhea. Elevated liver enzymes and pancreatic enzymes. EXAM: CT  ABDOMEN AND PELVIS WITH CONTRAST TECHNIQUE: Multidetector CT imaging of the abdomen and pelvis was performed using the standard protocol following bolus administration of intravenous contrast.  CONTRAST:  54mL OMNIPAQUE IOHEXOL 300 MG/ML  SOLN COMPARISON:  None. FINDINGS: Lower chest: Patchy ground-glass airspace opacities dependently in the lower lobes as well as in the posterior lingula. Cannot exclude pneumonia or COVID pneumonia. No effusions. Heart is normal size. Hepatobiliary: Mild diffuse fatty infiltration of the liver. No focal hepatic abnormality. Prior cholecystectomy. Pancreas: No focal abnormality or ductal dilatation. Spleen: No focal abnormality.  Normal size. Adrenals/Urinary Tract: Atrophic left kidney. No renal or adrenal mass. No hydronephrosis or stones. Urinary bladder unremarkable. Stomach/Bowel: Mildly prominent small bowel loops in the left upper quadrant. Other small bowel loops are fluid-filled. No well-defined focal transition zone. I favor this represents mild focal ileus. Stomach and large bowel unremarkable. Vascular/Lymphatic: No evidence of aneurysm or adenopathy. Scattered aortic atherosclerosis. Reproductive: No visible focal abnormality. Other: No free fluid or free air. Musculoskeletal: No acute bony abnormality. Degenerative facet disease in the lower lumbar spine. Associated slight anterolisthesis of L5 on S1. IMPRESSION: Mild diffuse fatty infiltration of the liver. Few prominent small bowel loops in the left upper quadrant, favor mild focal ileus rather than bowel obstruction. Aortic atherosclerosis. Prior cholecystectomy. Mildly atrophic left kidney. Electronically Signed   By: Rolm Baptise M.D.   On: 08/05/2019 22:58   DG Chest Port 1 View  Result Date: 08/08/2019 CLINICAL DATA:  Left chest pain and shortness of breath, recent COVID-19 positive 07/25/2019 EXAM: PORTABLE CHEST 1 VIEW COMPARISON:  12/28/2011, 08/05/2019 FINDINGS: Single frontal view of the chest demonstrates an unremarkable cardiac silhouette. There is patchy bilateral ground-glass airspace disease, left greater than right. No effusion or pneumothorax. No acute bony abnormality. IMPRESSION: 1.  Multifocal pneumonia, certainly compatible with COVID-19. Electronically Signed   By: Randa Ngo M.D.   On: 08/08/2019 17:52   ECHOCARDIOGRAM COMPLETE  Result Date: 08/09/2019    ECHOCARDIOGRAM REPORT   Patient Name:   Logan Harrison Date of Exam: 08/09/2019 Medical Rec #:  [id number]        Height:       70.0 in Accession #:    [id number]       Weight:       195.5 lb Date of Birth:  1956/02/17        BSA:          2.067 m Patient Age:    18 years         BP:           107/79 mmHg Patient Gender: M                HR:           95 bpm. Exam Location:  Inpatient Procedure: 2D Echo, Cardiac Doppler and Color Doppler Indications:    Pulmonary embolism 415.19  History:        Patient has no prior history of Echocardiogram examinations.                 Risk Factors:Non-Smoker. Pulmonary embolism.  Sonographer:    Vickie Epley RDCS Referring Phys: [id number] [place] T TU  Sonographer Comments: Covid positive. IMPRESSIONS  1. Left ventricular ejection fraction, by estimation, is 30 to 35%. The left ventricle has moderately decreased function. The left ventricle demonstrates regional wall motion abnormalities with severe septal hypokinesis and septal-lateral dyssynchrony consistent with LBBB. There is mild left ventricular hypertrophy. Left ventricular diastolic  parameters are consistent with Grade I diastolic dysfunction (impaired relaxation).  2. Right ventricular systolic function is normal. The right ventricular size is normal. Tricuspid regurgitation signal is inadequate for assessing PA pressure.  3. The mitral valve is normal in structure. No evidence of mitral valve regurgitation. No evidence of mitral stenosis.  4. The aortic valve is tricuspid. Aortic valve regurgitation is not visualized. No aortic stenosis is present.  5. The inferior vena cava is normal in size with <50% respiratory variability, suggesting right atrial pressure of 8 mmHg. FINDINGS  Left Ventricle: Left ventricular ejection fraction, by  estimation, is 30 to 35%. The left ventricle has moderately decreased function. The left ventricle demonstrates regional wall motion abnormalities. The left ventricular internal cavity size was normal in size. There is mild left ventricular hypertrophy. Left ventricular diastolic parameters are consistent with Grade I diastolic dysfunction (impaired relaxation). Right Ventricle: The right ventricular size is normal. No increase in right ventricular wall thickness. Right ventricular systolic function is normal. Tricuspid regurgitation signal is inadequate for assessing PA pressure. Left Atrium: Left atrial size was normal in size. Right Atrium: Right atrial size was normal in size. Pericardium: There is no evidence of pericardial effusion. Mitral Valve: The mitral valve is normal in structure. There is mild calcification of the mitral valve leaflet(s). No evidence of mitral valve regurgitation. No evidence of mitral valve stenosis. Tricuspid Valve: The tricuspid valve is normal in structure. Tricuspid valve regurgitation is not demonstrated. Aortic Valve: The aortic valve is tricuspid. Aortic valve regurgitation is not visualized. No aortic stenosis is present. Pulmonic Valve: The pulmonic valve was normal in structure. Pulmonic valve regurgitation is not visualized. Aorta: The aortic root is normal in size and structure. Venous: The inferior vena cava is normal in size with less than 50% respiratory variability, suggesting right atrial pressure of 8 mmHg. IAS/Shunts: No atrial level shunt detected by color flow Doppler.  LEFT VENTRICLE PLAX 2D LVIDd:         4.60 cm LVIDs:         4.00 cm LV PW:         0.80 cm LV IVS:        0.80 cm LVOT diam:     2.20 cm LV SV:         67 LV SV Index:   33 LVOT Area:     3.80 cm  LV Volumes (MOD) LV vol d, MOD A2C: 192.0 ml LV vol d, MOD A4C: 171.0 ml LV vol s, MOD A2C: 131.0 ml LV vol s, MOD A4C: 119.0 ml LV SV MOD A2C:     61.0 ml LV SV MOD A4C:     171.0 ml LV SV MOD BP:       61.2 ml RIGHT VENTRICLE RV S prime:     10.40 cm/s TAPSE (M-mode): 1.7 cm LEFT ATRIUM             Index       RIGHT ATRIUM           Index LA diam:        3.20 cm 1.55 cm/m  RA Area:     10.60 cm LA Vol (A2C):   43.3 ml 20.95 ml/m RA Volume:   23.80 ml  11.51 ml/m LA Vol (A4C):   33.3 ml 16.11 ml/m LA Biplane Vol: 39.8 ml 19.25 ml/m  AORTIC VALVE LVOT Vmax:   107.00 cm/s LVOT Vmean:  65.400 cm/s LVOT VTI:    0.177 m  AORTA Ao Root diam: 3.50 cm Ao Asc diam:  3.50 cm  SHUNTS Systemic VTI:  0.18 m Systemic Diam: 2.20 cm Loralie Champagne MD Electronically signed by Loralie Champagne MD Signature Date/Time: 08/09/2019/3:38:21 PM    Final    VAS [place] LOWER EXTREMITY VENOUS (DVT)  Result Date: 08/09/2019  Lower Venous DVTStudy Indications: Pulmonary embolism, and COVID.  Comparison Study: no prior Performing Technologist: June Leap RDMS, RVT  Examination Guidelines: A complete evaluation includes B-mode imaging, spectral Doppler, color Doppler, and power Doppler as needed of all accessible portions of each vessel. Bilateral testing is considered an integral part of a complete examination. Limited examinations for reoccurring indications may be performed as noted. The reflux portion of the exam is performed with the patient in reverse Trendelenburg.  +---------+---------------+---------+-----------+----------+--------------+ RIGHT    CompressibilityPhasicitySpontaneityPropertiesThrombus Aging +---------+---------------+---------+-----------+----------+--------------+ CFV      Full           Yes      Yes                                 +---------+---------------+---------+-----------+----------+--------------+ SFJ      Full                                                        +---------+---------------+---------+-----------+----------+--------------+ FV Prox  Full                                                        +---------+---------------+---------+-----------+----------+--------------+  FV Mid   Full                                                        +---------+---------------+---------+-----------+----------+--------------+ FV DistalFull                                                        +---------+---------------+---------+-----------+----------+--------------+ PFV      Full                                                        +---------+---------------+---------+-----------+----------+--------------+ POP      Full           Yes      Yes                                 +---------+---------------+---------+-----------+----------+--------------+ PTV      Full                                                        +---------+---------------+---------+-----------+----------+--------------+  PERO     Full                                                        +---------+---------------+---------+-----------+----------+--------------+   +---------+---------------+---------+-----------+----------+--------------+ LEFT     CompressibilityPhasicitySpontaneityPropertiesThrombus Aging +---------+---------------+---------+-----------+----------+--------------+ CFV      Full           Yes      Yes                                 +---------+---------------+---------+-----------+----------+--------------+ SFJ      Full                                                        +---------+---------------+---------+-----------+----------+--------------+ FV Prox  Full                                                        +---------+---------------+---------+-----------+----------+--------------+ FV Mid   Full                                                        +---------+---------------+---------+-----------+----------+--------------+ FV DistalFull                                                        +---------+---------------+---------+-----------+----------+--------------+ PFV      Full                                                         +---------+---------------+---------+-----------+----------+--------------+ POP      Full           Yes      Yes                                 +---------+---------------+---------+-----------+----------+--------------+ PTV      None                                         Acute          +---------+---------------+---------+-----------+----------+--------------+ PERO     None                                         Acute          +---------+---------------+---------+-----------+----------+--------------+  Summary: RIGHT: - There is no evidence of deep vein thrombosis in the lower extremity.  - No cystic structure found in the popliteal fossa.  LEFT: - Findings consistent with acute deep vein thrombosis involving the left posterior tibial veins, and left peroneal veins. - No cystic structure found in the popliteal fossa.  *See table(s) above for measurements and observations. Electronically signed by Monica Martinez MD on 08/09/2019 at 8:10:21 PM.    Final

## 2019-08-11 NOTE — Evaluation (Signed)
Physical Therapy Evaluation Patient Details Name: Logan Harrison MRN: [id number] DOB: [birth year] Today's Date: 08/11/2019   History of Present Illness  Patient is a 64 y.o. male with PMHx of prostate cancer s/p prostatectomy, remote history of lower extremity DVT, recently diagnosed with COVID-19 on 4/7-presenting with cough, left-sided chest pain-further evaluation revealed acute hypoxemic respiratory failure secondary to pulmonary embolism and COVID-19 pneumonia.  Clinical Impression  Pt is walking well, desating at the end of a long hallway walk momentarily to 86%, returned to 90s in <30 seconds with pursed lip breathing.  Mildly unsteady with slower gait pattern, but generally doing well.  I do not anticipate he will need follow up after d/c, but PT will follow him acutely to work on strength and higher level balance activities.   PT to follow acutely for deficits listed below.      Follow Up Recommendations No PT follow up    Equipment Recommendations  None recommended by PT    Recommendations for Other Services   NA    Precautions / Restrictions   Monitor O2     Mobility  Bed Mobility Overal bed mobility: Modified Independent                Transfers Overall transfer level: Needs assistance Equipment used: None Transfers: Sit to/from Stand Sit to Stand: Supervision         General transfer comment: supervision for safety  Ambulation/Gait Ambulation/Gait assistance: Supervision Gait Distance (Feet): 300 Feet Assistive device: IV Pole Gait Pattern/deviations: Step-through pattern;Staggering right;Staggering left Gait velocity: decreased Gait velocity interpretation: <1.8 ft/sec, indicate of risk for recurrent falls General Gait Details: Pt with mildly staggering gait pattern, slower than his normal speed, using IV pole for stability.          Balance Overall balance assessment: Needs assistance Sitting-balance support: Feet supported;No upper  extremity supported Sitting balance-Leahy Scale: Good     Standing balance support: No upper extremity supported Standing balance-Leahy Scale: Good                               Pertinent Vitals/Pain Pain Assessment: No/denies pain    Home Living Family/patient expects to be discharged to:: Private residence Living Arrangements: Alone Available Help at Discharge: Family;Available PRN/intermittently(son lives close by--is an Therapist, sports) Type of Home: House Home Access: Stairs to enter Entrance Stairs-Rails: Right Entrance Stairs-Number of Steps: 2 Home Layout: One level;Able to live on main level with bedroom/bathroom Home Equipment: None Additional Comments: son lives nearby, pt typically assists with taking care of his mother     Prior Function Level of Independence: Independent         Comments: driving, 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, newspaper   Dominant Hand: Right    Extremity/Trunk Assessment   Upper Extremity Assessment Upper Extremity Assessment: Defer to OT evaluation    Lower Extremity Assessment Lower Extremity Assessment: Overall WFL for tasks assessed    Cervical / Trunk Assessment Cervical / Trunk Assessment: Normal  Communication   Communication: No difficulties(mildly HOH)  Cognition Arousal/Alertness: Awake/alert Behavior During Therapy: WFL for tasks assessed/performed Overall Cognitive Status: Within Functional Limits for tasks assessed                                        General  Comments General comments (skin integrity, edema, etc.): VSS on RA during gait.  He dipped once to 86% at the end of hallway gait, but came back up to 90s in < 1 min of deep pursed lip breathing and remained in the 90s at rest.     Exercises Other Exercises Other Exercises: flutter valve x10 Other Exercises: incentive spirometer x 10 max inspired volume 2300 mL Other Exercises: Encouraged standing mini squats  and standing marches at the window.    Assessment/Plan    PT Assessment Patient needs continued PT services  PT Problem List Decreased strength;Decreased activity tolerance;Decreased balance;Decreased mobility;Decreased knowledge of precautions;Cardiopulmonary status limiting activity       PT Treatment Interventions DME instruction;Gait training;Stair training;Functional mobility training;Therapeutic activities;Therapeutic exercise;Balance training;Patient/family education    PT Goals (Current goals can be found in the Care Plan section)  Acute Rehab PT Goals Patient Stated Goal: home when able PT Goal Formulation: With patient Time For Goal Achievement: 08/25/19 Potential to Achieve Goals: Good    Frequency Min 3X/week           AM-PAC PT "6 Clicks" Mobility  Outcome Measure Help needed turning from your back to your side while in a flat bed without using bedrails?: None Help needed moving from lying on your back to sitting on the side of a flat bed without using bedrails?: None Help needed moving to and from a bed to a chair (including a wheelchair)?: None Help needed standing up from a chair using your arms (e.g., wheelchair or bedside chair)?: None Help needed to walk in hospital room?: None Help needed climbing 3-5 steps with a railing? : A Little 6 Click Score: 23    End of Session   Activity Tolerance: Patient tolerated treatment well Patient left: in chair;with call bell/phone within reach   PT Visit Diagnosis: Muscle weakness (generalized) (M62.81);Difficulty in walking, not elsewhere classified (R26.2)    Time: [id number] PT Time Calculation (min) (ACUTE ONLY): 27 min   Charges:         Verdene Lennert, PT, DPT  Acute Rehabilitation [phone] pager #[phone] office     PT Evaluation $PT Eval Low Complexity: 1 Low PT Treatments $Gait Training: 8-22 mins        08/11/2019, 2:31 PM

## 2019-08-12 LAB — COMPREHENSIVE METABOLIC PANEL
ALT: 144 U/L — ABNORMAL HIGH (ref 0–44)
AST: 45 U/L — ABNORMAL HIGH (ref 15–41)
Albumin: 2.2 g/dL — ABNORMAL LOW (ref 3.5–5.0)
Alkaline Phosphatase: 114 U/L (ref 38–126)
Anion gap: 9 (ref 5–15)
BUN: 32 mg/dL — ABNORMAL HIGH (ref 8–23)
CO2: 22 mmol/L (ref 22–32)
Calcium: 9 mg/dL (ref 8.9–10.3)
Chloride: 109 mmol/L (ref 98–111)
Creatinine, Ser: 1.31 mg/dL — ABNORMAL HIGH (ref 0.61–1.24)
GFR calc Af Amer: >60 mL/min (ref >60)
GFR calc non Af Amer: 58 mL/min — ABNORMAL LOW (ref >60)
Glucose, Bld: 193 mg/dL — ABNORMAL HIGH (ref 70–99)
Potassium: 5 mmol/L (ref 3.5–5.1)
Sodium: 140 mmol/L (ref 135–145)
Total Bilirubin: 0.8 mg/dL (ref 0.3–1.2)
Total Protein: 6 g/dL — ABNORMAL LOW (ref 6.5–8.1)

## 2019-08-12 LAB — CBC
HCT: 41.3 % (ref 39.0–52.0)
Hemoglobin: 13.7 g/dL (ref 13.0–17.0)
MCH: 30.6 pg (ref 26.0–34.0)
MCHC: 33.2 g/dL (ref 30.0–36.0)
MCV: 92.4 fL (ref 80.0–100.0)
Platelets: 356 10*3/uL (ref 150–400)
RBC: 4.47 MIL/uL (ref 4.22–5.81)
RDW: 11.9 % (ref 11.5–15.5)
WBC: 14.8 10*3/uL — ABNORMAL HIGH (ref 4.0–10.5)
nRBC: 0 % (ref 0.0–0.2)

## 2019-08-12 LAB — MAGNESIUM: Magnesium: 2 mg/dL (ref 1.7–2.4)

## 2019-08-12 LAB — C-REACTIVE PROTEIN: CRP: 3.2 mg/dL — ABNORMAL HIGH (ref <1.0)

## 2019-08-12 LAB — BRAIN NATRIURETIC PEPTIDE: B Natriuretic Peptide: 342.1 pg/mL — ABNORMAL HIGH (ref 0.0–100.0)

## 2019-08-12 LAB — D-DIMER, QUANTITATIVE: D-Dimer, Quant: 7.92 ug/mL-FEU — ABNORMAL HIGH (ref 0.00–0.50)

## 2019-08-12 LAB — PROCALCITONIN: Procalcitonin: 0.18 ng/mL

## 2019-08-12 MED ORDER — SODIUM POLYSTYRENE SULFONATE 15 GM/60ML PO SUSP
15.0000 g | Freq: Once | ORAL | Status: AC
  Administered 2019-08-12: 13:00:00 15 g via ORAL
  Filled 2019-08-12: qty 60

## 2019-08-12 MED ORDER — METHYLPREDNISOLONE SODIUM SUCC 40 MG IJ SOLR: 40.0000 mg | Freq: Every day | INTRAMUSCULAR | Status: DC

## 2019-08-12 NOTE — Progress Notes (Signed)
PROGRESS NOTE                                                                                                                                                                                                             Patient Demographics:    Logan Harrison, is a 64 y.o. male, DOB - Jun 23, 1955, [id number]  Outpatient Primary MD for the patient is Practice, [place] date - 08/08/2019   LOS - 4  Chief Complaint  Patient presents with  . Chest Pain       Brief Narrative: Patient is a 64 y.o. male with PMHx of prostate cancer s/p prostatectomy, remote history of lower extremity DVT when he was in high school-recently diagnosed with COVID-19 on 4/7-presenting with cough, left-sided chest pain-further evaluation revealed acute hypoxemic respiratory failure secondary to pulmonary embolism and COVID-19 pneumonia.  Significant Events: 4/7>> COVID-19 positive 4/21>> admit to [facility] for hypoxemia secondary to PE and COVID-19 pneumonia 4/22>> lower extremity Doppler-positive for DVT 4/22>> Echo-EF 30-35%  COVID-19 medications: Steroids: 4/21>> Remdesivir: 4/21   Antibiotics: none  Microbiology data: 4/21: Blood cultures>> negative  DVT prophylaxis:  IV heparin>> Xarelto  Procedures: None  Consults: None    Subjective:   Patient in chair, appears comfortable, denies any headache, no fever, no chest pain or pressure, no shortness of breath , no abdominal pain. No focal weakness.   Assessment  & Plan :   Acute Hypoxic Resp Failure due to Covid 19 Viral pneumonia and pulmonary embolism:  He had moderate to severe disease in the setting of acute [id number] pneumonia complicated by acute PE.  He is appropriately being treated with IV steroids and remdesivir and is gradually improving, continue anticoagulation.  He has been encouraged to sit up in chair in the daytime and use I-S and flutter valve for  pulmonary toiletry, at nighttime prone in bed if needed.  New to advance activity and titrate down oxygen.    Recent Labs  Lab 08/08/19 1717 08/08/19 1741 08/09/19 0759 08/10/19 0544 08/11/19 0235 08/12/19 0816  NA 133*  --  136 138 139 140  K 4.0  --  4.5 4.3 4.4 5.0  CL 103  --  106 108 109 109  CO2 18*  --  20* 21* 21* 22  GLUCOSE 126*  --  120* 167* 155*  193*  BUN 18  --  18 27* 34* 32*  CREATININE 1.27*  --  1.42* 1.33* 1.20 1.31*  CALCIUM 8.7*  --  8.5* 8.8* 8.8* 9.0  AST 64*  --  51* 39 94* 45*  ALT 134*  --  110* 98* 168* 144*  ALKPHOS 146*  --  134* 117 123 114  BILITOT 2.2*  --  1.1 0.8 0.6 0.8  ALBUMIN 3.1*  --  2.3* 2.2* 2.1* 2.2*  MG  --   --   --   --  2.0 2.0  CRP  --  18.1* 20.2* 17.5* 9.1* 3.2*  DDIMER  --  >20.00* 15.08* 9.60* 7.11* 7.92*  PROCALCITON  --  0.17  --   --  0.78 0.18  INR 1.1  --   --   --   --   --   BNP 109.9*  --   --   --  93.5 342.1*    Left lower extremity DVT with PE: Has history of previously provoked DVT as well, currently on Xarelto and stable.  CKD 3 baseline creatinine around 1.4 going back to 2015 in the chart: Stable at baseline.  Transaminitis: Mild asymptomatic with good liver synthetic function, INR 1.1 upon admission.  This is due to viral infection, stable will trend.  Leukocytosis: Likely secondary to steroid use-clinically improving without any suggestion of bacterial infection.  Follow.  Newly diagnosed chronic systolic heart failure (EF 30-35% by echo on 4/22): He is euvolemic on exam-suspect this is a chronic finding and not related to COVID-19 infection.  Spoke with cardiology (Dr. Virgina Jock) who reviewed chart-he will arrange outpatient follow-up.  Agree with low-dose beta-blocker.  LBBB: Per patient this is a chronic issue-he has a remote history of being referred to cardiology (approximately 10 years back) for this.  History of prostate cancer s/p prostatectomy - no acute issues follow with PCP.     Condition  - Stable  Family Communication  : Son [name]  (RN at [employer]) [phone] on 08/11/18.  Code Status :  Full Code   Diet :  Diet Order            Diet Heart Room service appropriate? Yes; Fluid consistency: Thin  Diet effective now               Disposition Plan  : Patient is finishing his treatment for COVID-19 pneumonia with IV remdesivir, also has acute PE for which she is being treated.  If stable likely to be discharged home on 08/12/2020.   Antimicorbials  :    Anti-infectives (From admission, onward)   Start     Dose/Rate Route Frequency Ordered Stop   08/10/19 1000  remdesivir 100 mg in sodium chloride 0.9 % 100 mL IVPB     100 mg 200 mL/hr over 30 Minutes Intravenous Daily 08/09/19 0155 08/14/19 0959   08/09/19 0200  remdesivir 100 mg in sodium chloride 0.9 % 100 mL IVPB     100 mg 200 mL/hr over 30 Minutes Intravenous Every 30 min 08/09/19 0155 08/09/19 0413      Inpatient Medications  Scheduled Meds: . acetaminophen  500 mg Oral BID  . benzonatate  200 mg Oral TID  . carvedilol  3.125 mg Oral BID WC  . famotidine  20 mg Oral Daily  . feeding supplement (ENSURE ENLIVE)  237 mL Oral BID BM  . feeding supplement (PRO-STAT SUGAR FREE 64)  30 mL Oral TID WC  . [START ON  08/13/2019] methylPREDNISolone (SOLU-MEDROL) injection  40 mg Intravenous Daily  . Rivaroxaban  15 mg Oral BID WC   Followed by  . [START ON 08/31/2019] rivaroxaban  20 mg Oral Q supper   Continuous Infusions: . remdesivir 100 mg in NS 100 mL 100 mg (08/11/19 1154)   PRN Meds:.acetaminophen, albuterol, chlorpheniramine-HYDROcodone, ondansetron (ZOFRAN) IV, traMADol   Time Spent in minutes  35   See all Orders from today for further details   Lala Lund M.D on 08/12/2019 at 10:39 AM  To page go to [email or web address] - use universal password  [facility] -  Office  [phone]    Objective:   Vitals:   08/11/19 1926 08/11/19 2014 08/12/19 0000 08/12/19 0452  BP: 114/69    116/73  Pulse: 69     Resp: 14     Temp: 98.3 F (36.8 C) 97.7 F (36.5 C) 97.9 F (36.6 C) 98 F (36.7 C)  TempSrc: Oral     SpO2: 92%     Weight:      Height:        Wt Readings from Last 3 Encounters:  08/08/19 88.7 kg  08/05/19 86.2 kg    No intake or output data in the 24 hours ending 08/12/19 1039   Physical Exam  Awake Alert, No new F.N deficits, Normal affect [place].AT,PERRAL Supple Neck,No JVD, No cervical lymphadenopathy appriciated.  Symmetrical Chest wall movement, Good air movement bilaterally, CTAB RRR,No Gallops, Rubs or new Murmurs, No Parasternal Heave +ve B.Sounds, Abd Soft, No tenderness, No organomegaly appriciated, No rebound - guarding or rigidity. No Cyanosis, Clubbing or edema, No new Rash or bruise     Data Review:    CBC Recent Labs  Lab 08/05/19 2037 08/05/19 2037 08/08/19 1717 08/09/19 0759 08/10/19 0544 08/11/19 0235 08/12/19 0816  WBC 6.8   < > 11.8* 13.7* 20.6* 21.9* 14.8*  HGB 16.3   < > 15.1 13.8 13.7 13.1 13.7  HCT 48.3   < > 43.7 40.6 40.8 38.6* 41.3  PLT 247   < > 314 313 333 380 356  MCV 91.8   < > 91.2 91.4 91.3 91.0 92.4  MCH 31.0   < > 31.5 31.1 30.6 30.9 30.6  MCHC 33.7   < > 34.6 34.0 33.6 33.9 33.2  RDW 11.9   < > 11.8 11.9 11.8 11.8 11.9  LYMPHSABS 1.5  --  1.0  --   --   --   --   MONOABS 0.7  --  1.4*  --   --   --   --   EOSABS 0.1  --  0.0  --   --   --   --   BASOSABS 0.0  --  0.0  --   --   --   --    < > = values in this interval not displayed.    Chemistries  Recent Labs  Lab 08/08/19 1717 08/09/19 0759 08/10/19 0544 08/11/19 0235 08/12/19 0816  NA 133* 136 138 139 140  K 4.0 4.5 4.3 4.4 5.0  CL 103 106 108 109 109  CO2 18* 20* 21* 21* 22  GLUCOSE 126* 120* 167* 155* 193*  BUN 18 18 27* 34* 32*  CREATININE 1.27* 1.42* 1.33* 1.20 1.31*  CALCIUM 8.7* 8.5* 8.8* 8.8* 9.0  MG  --   --   --  2.0 2.0  AST 64* 51* 39 94* 45*  ALT 134* 110* 98* 168* 144*  ALKPHOS 146* 134* 117 123  114  BILITOT  2.2* 1.1 0.8 0.6 0.8   ------------------------------------------------------------------------------------------------------------------ No results for input(s): CHOL, HDL, LDLCALC, TRIG, CHOLHDL, LDLDIRECT in the last 72 hours.  No results found for: HGBA1C ------------------------------------------------------------------------------------------------------------------ No results for input(s): TSH, T4TOTAL, T3FREE, THYROIDAB in the last 72 hours.  Invalid input(s): FREET3 ------------------------------------------------------------------------------------------------------------------ Recent Labs    08/10/19 0544 08/11/19 0235  FERRITIN 2,320* 2,156*    Coagulation profile Recent Labs  Lab 08/08/19 1717  INR 1.1    Recent Labs    08/11/19 0235 08/12/19 0816  DDIMER 7.11* 7.92*    Cardiac Enzymes No results for input(s): CKMB, TROPONINI, MYOGLOBIN in the last 168 hours.  Invalid input(s): CK ------------------------------------------------------------------------------------------------------------------    Component Value Date/Time   BNP 342.1 (H) 08/12/2019 [id number]    Micro Results Recent Results (from the past 240 hour(s))  Blood Culture (routine x 2)     Status: None (Preliminary result)   Collection Time: 08/08/19  5:17 PM   Specimen: BLOOD  Result Value Ref Range Status   Specimen Description   Final    BLOOD RIGHT ANTECUBITAL Performed at [facility], [address] [place], [place] [address]    Special Requests   Final    BOTTLES DRAWN AEROBIC AND ANAEROBIC Blood Culture adequate volume Performed at [facility], [address] [place], [place] [address]    Culture   Final    NO GROWTH 3 DAYS Performed at [facility], [place] [address], [place], [place] [address]    Report Status PENDING  Incomplete  Blood Culture (routine x 2)     Status: None (Preliminary result)   Collection Time: 08/08/19  5:41 PM   Specimen: BLOOD  RIGHT HAND  Result Value Ref Range Status   Specimen Description   Final    BLOOD RIGHT HAND Performed at [facility], [place] [address] [place], [place] [address]    Special Requests   Final    BOTTLES DRAWN AEROBIC AND ANAEROBIC Blood Culture adequate volume Performed at [facility], [address] [place], [place] [address]    Culture   Final    NO GROWTH 3 DAYS Performed at [facility], [place] [address] [place], [place] [address]    Report Status PENDING  Incomplete  Respiratory Panel by RT PCR (Flu A&B, Covid) - Nasopharyngeal Swab     Status: Abnormal   Collection Time: 08/08/19  7:56 PM   Specimen: Nasopharyngeal Swab  Result Value Ref Range Status   SARS Coronavirus 2 by RT PCR POSITIVE (A) NEGATIVE Final    Comment: RESULT CALLED TO, READ BACK BY AND VERIFIED WITH: ADKINS,L AT 2100 ON [id number] BY CHERESNOWSKY,T (NOTE) SARS-CoV-2 target nucleic acids are DETECTED. SARS-CoV-2 RNA is generally detectable in upper respiratory specimens  during the acute phase of infection. Positive results are indicative of the presence of the identified virus, but do not rule out bacterial infection or co-infection with other pathogens not detected by the test. Clinical correlation with patient history and other diagnostic information is necessary to determine patient infection status. The expected result is Negative. Fact Sheet for Patients:  PinkCheek.be Fact Sheet for Healthcare Providers: GravelBags.it This test is not yet approved or cleared by the [place] FDA and  has been authorized for detection and/or diagnosis of SARS-CoV-2 by FDA under an Emergency Use Authorization (EUA).  This EUA will remain in effect (meaning this test can b e used) for  the duration of  the COVID-19 declaration under Section 564(b)(1) of the Act, 21 U.S.C. section 360bbb-3(b)(1), unless the authorization is terminated or  revoked sooner.    Influenza A by PCR NEGATIVE NEGATIVE Final   Influenza B by PCR NEGATIVE NEGATIVE Final    Comment: (NOTE) The Xpert Xpress SARS-CoV-2/FLU/RSV assay is intended as an aid in  the diagnosis of influenza from Nasopharyngeal swab specimens and  should not be used as a sole basis for treatment. Nasal washings and  aspirates are unacceptable for Xpert Xpress SARS-CoV-2/FLU/RSV  testing. Fact Sheet for Patients: PinkCheek.be Fact Sheet for Healthcare Providers: GravelBags.it This test is not yet approved or cleared by the [place] FDA and  has been authorized for detection and/or diagnosis of SARS-CoV-2 by  FDA under an Emergency Use Authorization (EUA). This EUA will remain  in effect (meaning this test can be used) for the duration of the  Covid-19 declaration under Section 564(b)(1) of the Act, 21  U.S.C. section 360bbb-3(b)(1), unless the authorization is  terminated or revoked. Performed at [facility], [address], [place], [place] [address]     Radiology Reports CT Angio Chest PE W/Cm &/Or Wo Cm  Result Date: 08/08/2019 CLINICAL DATA:  64 year old male with chest pain and shortness of breath. Recent positive COVID-19 on 07/25/2019. EXAM: CT ANGIOGRAPHY CHEST WITH CONTRAST TECHNIQUE: Multidetector CT imaging of the chest was performed using the standard protocol during bolus administration of intravenous contrast. Multiplanar CT image reconstructions and MIPs were obtained to evaluate the vascular anatomy. CONTRAST:  168mL OMNIPAQUE IOHEXOL 350 MG/ML SOLN COMPARISON:  Chest radiograph dated 08/08/2019 and CT of the abdomen pelvis dated 08/05/2019. FINDINGS: Cardiovascular: There is no cardiomegaly or pericardial effusion. There is straightening of the interventricular septum with near equal greatest ventricular lumen diameter each measuring approximately 4.3 cm and ratio of 1. Findings  suggestive of a degree of right heart straining. The thoracic aorta is unremarkable. The origins of the great vessels of the aortic arch appear patent. Large bilateral central pulmonary artery emboli extending into the lobar and segmental branches. Mediastinum/Nodes: No hilar or mediastinal adenopathy. The esophagus and thyroid gland are grossly unremarkable. No mediastinal fluid collection. Lungs/Pleura: Bilateral confluent airspace opacity primarily involving the peripheral and subpleural lungs most consistent with multifocal pneumonia and in keeping with COVID-19. There is no pleural effusion or pneumothorax. The central airways are patent. Upper Abdomen: Diffuse fatty liver. Cholecystectomy. Loose stool within the colon compatible with diarrheal state. Musculoskeletal: No chest wall abnormality. No acute or significant osseous findings. Review of the MIP images confirms the above findings. IMPRESSION: 1. Positive for acute PE with CTevidence of right heart strain (RV/LV Ratio = 1) consistent with at least submassive (intermediate risk) PE. The presence of right heart strain has been associated with an increased risk of morbidity and mortality. 2. Multifocal pneumonia and in keeping with COVID-19. 3. Fatty liver. 4. Diarrheal state. These results were called by telephone at the time of interpretation on 08/08/2019 at 7:31 pm to provider [facility] , who verbally acknowledged these results. Electronically Signed   By: Anner Crete M.D.   On: 08/08/2019 19:44   CT ABDOMEN PELVIS W CONTRAST  Result Date: 08/05/2019 CLINICAL DATA:  Diarrhea. Elevated liver enzymes and pancreatic enzymes. EXAM: CT ABDOMEN AND PELVIS WITH CONTRAST TECHNIQUE: Multidetector CT imaging of the abdomen and pelvis was performed using the standard protocol following bolus administration of intravenous contrast. CONTRAST:  36mL OMNIPAQUE IOHEXOL 300 MG/ML  SOLN  COMPARISON:  None. FINDINGS: Lower chest: Patchy ground-glass airspace  opacities dependently in the lower lobes as well as in the posterior lingula. Cannot exclude pneumonia or COVID pneumonia. No effusions. Heart is normal size. Hepatobiliary: Mild diffuse fatty infiltration of the liver. No focal hepatic abnormality. Prior cholecystectomy. Pancreas: No focal abnormality or ductal dilatation. Spleen: No focal abnormality.  Normal size. Adrenals/Urinary Tract: Atrophic left kidney. No renal or adrenal mass. No hydronephrosis or stones. Urinary bladder unremarkable. Stomach/Bowel: Mildly prominent small bowel loops in the left upper quadrant. Other small bowel loops are fluid-filled. No well-defined focal transition zone. I favor this represents mild focal ileus. Stomach and large bowel unremarkable. Vascular/Lymphatic: No evidence of aneurysm or adenopathy. Scattered aortic atherosclerosis. Reproductive: No visible focal abnormality. Other: No free fluid or free air. Musculoskeletal: No acute bony abnormality. Degenerative facet disease in the lower lumbar spine. Associated slight anterolisthesis of L5 on S1. IMPRESSION: Mild diffuse fatty infiltration of the liver. Few prominent small bowel loops in the left upper quadrant, favor mild focal ileus rather than bowel obstruction. Aortic atherosclerosis. Prior cholecystectomy. Mildly atrophic left kidney. Electronically Signed   By: Rolm Baptise M.D.   On: 08/05/2019 22:58   DG Chest Port 1 View  Result Date: 08/08/2019 CLINICAL DATA:  Left chest pain and shortness of breath, recent COVID-19 positive 07/25/2019 EXAM: PORTABLE CHEST 1 VIEW COMPARISON:  12/28/2011, 08/05/2019 FINDINGS: Single frontal view of the chest demonstrates an unremarkable cardiac silhouette. There is patchy bilateral ground-glass airspace disease, left greater than right. No effusion or pneumothorax. No acute bony abnormality. IMPRESSION: 1. Multifocal pneumonia, certainly compatible with COVID-19. Electronically Signed   By: Randa Ngo M.D.   On: 08/08/2019  17:52   ECHOCARDIOGRAM COMPLETE  Result Date: 08/09/2019    ECHOCARDIOGRAM REPORT   Patient Name:   HSER EAGAR Crance Date of Exam: 08/09/2019 Medical Rec #:  [id number]        Height:       70.0 in Accession #:    [id number]       Weight:       195.5 lb Date of Birth:  March 15, 1956        BSA:          2.067 m Patient Age:    65 years         BP:           107/79 mmHg Patient Gender: M                HR:           95 bpm. Exam Location:  Inpatient Procedure: 2D Echo, Cardiac Doppler and Color Doppler Indications:    Pulmonary embolism 415.19  History:        Patient has no prior history of Echocardiogram examinations.                 Risk Factors:Non-Smoker. Pulmonary embolism.  Sonographer:    Vickie Epley RDCS Referring Phys: [id number] Adrian T TU  Sonographer Comments: Covid positive. IMPRESSIONS  1. Left ventricular ejection fraction, by estimation, is 30 to 35%. The left ventricle has moderately decreased function. The left ventricle demonstrates regional wall motion abnormalities with severe septal hypokinesis and septal-lateral dyssynchrony consistent with LBBB. There is mild left ventricular hypertrophy. Left ventricular diastolic parameters are consistent with Grade I diastolic dysfunction (impaired relaxation).  2. Right ventricular systolic function is normal. The right ventricular size is normal. Tricuspid regurgitation signal is inadequate for assessing PA pressure.  3.  The mitral valve is normal in structure. No evidence of mitral valve regurgitation. No evidence of mitral stenosis.  4. The aortic valve is tricuspid. Aortic valve regurgitation is not visualized. No aortic stenosis is present.  5. The inferior vena cava is normal in size with <50% respiratory variability, suggesting right atrial pressure of 8 mmHg. FINDINGS  Left Ventricle: Left ventricular ejection fraction, by estimation, is 30 to 35%. The left ventricle has moderately decreased function. The left ventricle demonstrates regional wall  motion abnormalities. The left ventricular internal cavity size was normal in size. There is mild left ventricular hypertrophy. Left ventricular diastolic parameters are consistent with Grade I diastolic dysfunction (impaired relaxation). Right Ventricle: The right ventricular size is normal. No increase in right ventricular wall thickness. Right ventricular systolic function is normal. Tricuspid regurgitation signal is inadequate for assessing PA pressure. Left Atrium: Left atrial size was normal in size. Right Atrium: Right atrial size was normal in size. Pericardium: There is no evidence of pericardial effusion. Mitral Valve: The mitral valve is normal in structure. There is mild calcification of the mitral valve leaflet(s). No evidence of mitral valve regurgitation. No evidence of mitral valve stenosis. Tricuspid Valve: The tricuspid valve is normal in structure. Tricuspid valve regurgitation is not demonstrated. Aortic Valve: The aortic valve is tricuspid. Aortic valve regurgitation is not visualized. No aortic stenosis is present. Pulmonic Valve: The pulmonic valve was normal in structure. Pulmonic valve regurgitation is not visualized. Aorta: The aortic root is normal in size and structure. Venous: The inferior vena cava is normal in size with less than 50% respiratory variability, suggesting right atrial pressure of 8 mmHg. IAS/Shunts: No atrial level shunt detected by color flow Doppler.  LEFT VENTRICLE PLAX 2D LVIDd:         4.60 cm LVIDs:         4.00 cm LV PW:         0.80 cm LV IVS:        0.80 cm LVOT diam:     2.20 cm LV SV:         67 LV SV Index:   33 LVOT Area:     3.80 cm  LV Volumes (MOD) LV vol d, MOD A2C: 192.0 ml LV vol d, MOD A4C: 171.0 ml LV vol s, MOD A2C: 131.0 ml LV vol s, MOD A4C: 119.0 ml LV SV MOD A2C:     61.0 ml LV SV MOD A4C:     171.0 ml LV SV MOD BP:      61.2 ml RIGHT VENTRICLE RV S prime:     10.40 cm/s TAPSE (M-mode): 1.7 cm LEFT ATRIUM             Index       RIGHT ATRIUM            Index LA diam:        3.20 cm 1.55 cm/m  RA Area:     10.60 cm LA Vol (A2C):   43.3 ml 20.95 ml/m RA Volume:   23.80 ml  11.51 ml/m LA Vol (A4C):   33.3 ml 16.11 ml/m LA Biplane Vol: 39.8 ml 19.25 ml/m  AORTIC VALVE LVOT Vmax:   107.00 cm/s LVOT Vmean:  65.400 cm/s LVOT VTI:    0.177 m  AORTA Ao Root diam: 3.50 cm Ao Asc diam:  3.50 cm  SHUNTS Systemic VTI:  0.18 m Systemic Diam: 2.20 cm Loralie Champagne MD Electronically signed by Loralie Champagne MD Signature Date/Time:  08/09/2019/3:38:21 PM    Final    VAS [place] LOWER EXTREMITY VENOUS (DVT)  Result Date: 08/09/2019  Lower Venous DVTStudy Indications: Pulmonary embolism, and COVID.  Comparison Study: no prior Performing Technologist: June Leap RDMS, RVT  Examination Guidelines: A complete evaluation includes B-mode imaging, spectral Doppler, color Doppler, and power Doppler as needed of all accessible portions of each vessel. Bilateral testing is considered an integral part of a complete examination. Limited examinations for reoccurring indications may be performed as noted. The reflux portion of the exam is performed with the patient in reverse Trendelenburg.  +---------+---------------+---------+-----------+----------+--------------+ RIGHT    CompressibilityPhasicitySpontaneityPropertiesThrombus Aging +---------+---------------+---------+-----------+----------+--------------+ CFV      Full           Yes      Yes                                 +---------+---------------+---------+-----------+----------+--------------+ SFJ      Full                                                        +---------+---------------+---------+-----------+----------+--------------+ FV Prox  Full                                                        +---------+---------------+---------+-----------+----------+--------------+ FV Mid   Full                                                         +---------+---------------+---------+-----------+----------+--------------+ FV DistalFull                                                        +---------+---------------+---------+-----------+----------+--------------+ PFV      Full                                                        +---------+---------------+---------+-----------+----------+--------------+ POP      Full           Yes      Yes                                 +---------+---------------+---------+-----------+----------+--------------+ PTV      Full                                                        +---------+---------------+---------+-----------+----------+--------------+ PERO     Full                                                        +---------+---------------+---------+-----------+----------+--------------+   +---------+---------------+---------+-----------+----------+--------------+  LEFT     CompressibilityPhasicitySpontaneityPropertiesThrombus Aging +---------+---------------+---------+-----------+----------+--------------+ CFV      Full           Yes      Yes                                 +---------+---------------+---------+-----------+----------+--------------+ SFJ      Full                                                        +---------+---------------+---------+-----------+----------+--------------+ FV Prox  Full                                                        +---------+---------------+---------+-----------+----------+--------------+ FV Mid   Full                                                        +---------+---------------+---------+-----------+----------+--------------+ FV DistalFull                                                        +---------+---------------+---------+-----------+----------+--------------+ PFV      Full                                                         +---------+---------------+---------+-----------+----------+--------------+ POP      Full           Yes      Yes                                 +---------+---------------+---------+-----------+----------+--------------+ PTV      None                                         Acute          +---------+---------------+---------+-----------+----------+--------------+ PERO     None                                         Acute          +---------+---------------+---------+-----------+----------+--------------+     Summary: RIGHT: - There is no evidence of deep vein thrombosis in the lower extremity.  - No cystic structure found in the popliteal fossa.  LEFT: - Findings consistent with acute deep vein thrombosis involving the left posterior tibial veins, and left peroneal veins. - No cystic structure found in the popliteal  fossa.  *See table(s) above for measurements and observations. Electronically signed by Monica Martinez MD on 08/09/2019 at 8:10:21 PM.    Final

## 2019-08-13 DIAGNOSIS — R7401 Elevation of levels of liver transaminase levels: Secondary | ICD-10-CM

## 2019-08-13 LAB — COMPREHENSIVE METABOLIC PANEL
ALT: 119 U/L — ABNORMAL HIGH (ref 0–44)
AST: 38 U/L (ref 15–41)
Albumin: 2.1 g/dL — ABNORMAL LOW (ref 3.5–5.0)
Alkaline Phosphatase: 104 U/L (ref 38–126)
Anion gap: 10 (ref 5–15)
BUN: 31 mg/dL — ABNORMAL HIGH (ref 8–23)
CO2: 25 mmol/L (ref 22–32)
Calcium: 8.6 mg/dL — ABNORMAL LOW (ref 8.9–10.3)
Chloride: 104 mmol/L (ref 98–111)
Creatinine, Ser: 1.38 mg/dL — ABNORMAL HIGH (ref 0.61–1.24)
GFR calc Af Amer: >60 mL/min (ref >60)
GFR calc non Af Amer: 54 mL/min — ABNORMAL LOW (ref >60)
Glucose, Bld: 146 mg/dL — ABNORMAL HIGH (ref 70–99)
Potassium: 4.4 mmol/L (ref 3.5–5.1)
Sodium: 139 mmol/L (ref 135–145)
Total Bilirubin: 0.9 mg/dL (ref 0.3–1.2)
Total Protein: 5.4 g/dL — ABNORMAL LOW (ref 6.5–8.1)

## 2019-08-13 LAB — CBC
HCT: 39.2 % (ref 39.0–52.0)
Hemoglobin: 13.1 g/dL (ref 13.0–17.0)
MCH: 30.9 pg (ref 26.0–34.0)
MCHC: 33.4 g/dL (ref 30.0–36.0)
MCV: 92.5 fL (ref 80.0–100.0)
Platelets: 339 10*3/uL (ref 150–400)
RBC: 4.24 MIL/uL (ref 4.22–5.81)
RDW: 12 % (ref 11.5–15.5)
WBC: 11.8 10*3/uL — ABNORMAL HIGH (ref 4.0–10.5)
nRBC: 0 % (ref 0.0–0.2)

## 2019-08-13 LAB — C-REACTIVE PROTEIN: CRP: 1.5 mg/dL — ABNORMAL HIGH (ref <1.0)

## 2019-08-13 LAB — CULTURE, BLOOD (ROUTINE X 2)
Culture: NO GROWTH
Culture: NO GROWTH
Special Requests: ADEQUATE
Special Requests: ADEQUATE

## 2019-08-13 LAB — BRAIN NATRIURETIC PEPTIDE: B Natriuretic Peptide: 85.1 pg/mL (ref 0.0–100.0)

## 2019-08-13 LAB — D-DIMER, QUANTITATIVE: D-Dimer, Quant: 3.83 ug/mL-FEU — ABNORMAL HIGH (ref 0.00–0.50)

## 2019-08-13 LAB — MAGNESIUM: Magnesium: 1.9 mg/dL (ref 1.7–2.4)

## 2019-08-13 LAB — PROCALCITONIN: Procalcitonin: 0.21 ng/mL

## 2019-08-13 MED ORDER — RIVAROXABAN 15 MG PO TABS: 15.0000 mg | ORAL_TABLET | Freq: Two times a day (BID) | ORAL | 0 refills | Status: DC

## 2019-08-13 MED ORDER — ALBUTEROL SULFATE HFA 108 (90 BASE) MCG/ACT IN AERS: 2.0000 | INHALATION_SPRAY | RESPIRATORY_TRACT | 0 refills | Status: DC | PRN

## 2019-08-13 MED ORDER — RIVAROXABAN 20 MG PO TABS: 20.0000 mg | ORAL_TABLET | Freq: Every day | ORAL | 0 refills | Status: DC

## 2019-08-13 MED ORDER — CARVEDILOL 3.125 MG PO TABS: 3.1250 mg | ORAL_TABLET | Freq: Two times a day (BID) | ORAL | 0 refills | Status: DC

## 2019-08-13 NOTE — Care Management (Addendum)
Pt deemed stable for discharge home today. Pt confirms he is independent from home, has a PCP and denied barriers with paying for medications.  Pt will discharge home on Xarelto.  CM printed both free 30 day card and reduced copay card to nurse station - bedside nurse to provide before pt discharges home.  CM submitted benefit check.   Discharge order is signed however medication reconciliation is incomplete.  CM awaiting med rec completion before signing off on pt.Marland Kitchen

## 2019-08-13 NOTE — TOC Benefit Eligibility Note (Signed)
Transition of Care [facility]) Benefit Eligibility Note    Patient Details  Name: FARRON YEARGIN MRN: [id number] Date of Birth: [birth year]   Medication/Dose: Alveda Reasons 10 MG  CO-PAY- $35.00  AND  XARELTO 20 MG  DAILY CO-PAY- $35.00  Covered?: Yes  Tier: 2 Drug  Prescription Coverage Preferred Pharmacy: [facility] [place] with Person/Company/Phone Number:: DEB  @   [employer] [id number] #  [phone]  Co-Pay: $35.00  Prior Approval: No  Deductible: (NO DEDUCTIBLE   and   OUT-OF-POCKET-UNMET)  Additional Notes: XARELTO 15 MG BID , COVER- YES, P/A -YES # [phone]    Memory Argue Phone Number: 08/13/2019, 10:45 AM

## 2019-08-13 NOTE — Discharge Instructions (Addendum)
Follow with Primary MD Practice, [facility] in 7 days   Get CBC, CMP, 2 view Chest X ray -  checked next visit within 1 week by Primary MD   Activity: As tolerated with Full fall precautions use walker/cane & assistance as needed  Disposition Home    Diet: Heart Healthy     Special Instructions: If you have smoked or chewed Tobacco  in the last 2 yrs please stop smoking, stop any regular Alcohol  and or any Recreational drug use.  On your next visit with your primary care physician please Get Medicines reviewed and adjusted.  Please request your Prim.MD to go over all Hospital Tests and Procedure/Radiological results at the follow up, please get all Hospital records sent to your Prim MD by signing hospital release before you go home.  If you experience worsening of your admission symptoms, develop shortness of breath, life threatening emergency, suicidal or homicidal thoughts you must seek medical attention immediately by calling 911 or calling your MD immediately  if symptoms less severe.  You Must read complete instructions/literature along with all the possible adverse reactions/side effects for all the Medicines you take and that have been prescribed to you. Take any new Medicines after you have completely understood and accpet all the possible adverse reactions/side effects.        Person Under Monitoring Name: Logan Harrison  Location: [place] [address]   Infection Prevention Recommendations for Individuals Confirmed to have, or Being Evaluated for, 2019 Novel Coronavirus (COVID-19) Infection Who Receive Care at Home  Individuals who are confirmed to have, or are being evaluated for, COVID-19 should follow the prevention steps below until a healthcare provider or local or state health department says they can return to normal activities.  Stay home except to get medical care You should restrict activities outside your home, except for getting  medical care. Do not go to work, school, or public areas, and do not use public transportation or taxis.  Call ahead before visiting your doctor Before your medical appointment, call the healthcare provider and tell them that you have, or are being evaluated for, COVID-19 infection. This will help the healthcare provider's office take steps to keep other people from getting infected. Ask your healthcare provider to call the local or state health department.  Monitor your symptoms Seek prompt medical attention if your illness is worsening (e.g., difficulty breathing). Before going to your medical appointment, call the healthcare provider and tell them that you have, or are being evaluated for, COVID-19 infection. Ask your healthcare provider to call the local or state health department.  Wear a facemask You should wear a facemask that covers your nose and mouth when you are in the same room with other people and when you visit a healthcare provider. People who live with or visit you should also wear a facemask while they are in the same room with you.  Separate yourself from other people in your home As much as possible, you should stay in a different room from other people in your home. Also, you should use a separate bathroom, if available.  Avoid sharing household items You should not share dishes, drinking glasses, cups, eating utensils, towels, bedding, or other items with other people in your home. After using these items, you should wash them thoroughly with soap and water.  Cover your coughs and sneezes Cover your mouth and nose with a tissue when you cough or sneeze, or you can  cough or sneeze into your sleeve. Throw used tissues in a lined trash can, and immediately wash your hands with soap and water for at least 20 seconds or use an alcohol-based hand rub.  Wash your [facility] your hands often and thoroughly with soap and water for at least 20 seconds. You can use an  alcohol-based hand sanitizer if soap and water are not available and if your hands are not visibly dirty. Avoid touching your eyes, nose, and mouth with unwashed hands.   Prevention Steps for Caregivers and Household Members of Individuals Confirmed to have, or Being Evaluated for, COVID-19 Infection Being Cared for in the Home  If you live with, or provide care at home for, a person confirmed to have, or being evaluated for, COVID-19 infection please follow these guidelines to prevent infection:  Follow healthcare provider's instructions Make sure that you understand and can help the patient follow any healthcare provider instructions for all care.  Provide for the patient's basic needs You should help the patient with basic needs in the home and provide support for getting groceries, prescriptions, and other personal needs.  Monitor the patient's symptoms If they are getting sicker, call his or her medical provider and tell them that the patient has, or is being evaluated for, COVID-19 infection. This will help the healthcare provider's office take steps to keep other people from getting infected. Ask the healthcare provider to call the local or state health department.  Limit the number of people who have contact with the patient  If possible, have only one caregiver for the patient.  Other household members should stay in another home or place of residence. If this is not possible, they should stay  in another room, or be separated from the patient as much as possible. Use a separate bathroom, if available.  Restrict visitors who do not have an essential need to be in the home.  Keep older adults, very young children, and other sick people away from the patient Keep older adults, very young children, and those who have compromised immune systems or chronic health conditions away from the patient. This includes people with chronic heart, lung, or kidney conditions, diabetes, and  cancer.  Ensure good ventilation Make sure that shared spaces in the home have good air flow, such as from an air conditioner or an opened window, weather permitting.  Wash your hands often  Wash your hands often and thoroughly with soap and water for at least 20 seconds. You can use an alcohol based hand sanitizer if soap and water are not available and if your hands are not visibly dirty.  Avoid touching your eyes, nose, and mouth with unwashed hands.  Use disposable paper towels to dry your hands. If not available, use dedicated cloth towels and replace them when they become wet.  Wear a facemask and gloves  Wear a disposable facemask at all times in the room and gloves when you touch or have contact with the patient's blood, body fluids, and/or secretions or excretions, such as sweat, saliva, sputum, nasal mucus, vomit, urine, or feces.  Ensure the mask fits over your nose and mouth tightly, and do not touch it during use.  Throw out disposable facemasks and gloves after using them. Do not reuse.  Wash your hands immediately after removing your facemask and gloves.  If your personal clothing becomes contaminated, carefully remove clothing and launder. Wash your hands after handling contaminated clothing.  Place all used disposable facemasks, gloves, and  other waste in a lined container before disposing them with other household waste.  Remove gloves and wash your hands immediately after handling these items.  Do not share dishes, glasses, or other household items with the patient  Avoid sharing household items. You should not share dishes, drinking glasses, cups, eating utensils, towels, bedding, or other items with a patient who is confirmed to have, or being evaluated for, COVID-19 infection.  After the person uses these items, you should wash them thoroughly with soap and water.  Wash laundry thoroughly  Immediately remove and wash clothes or bedding that have blood, body  fluids, and/or secretions or excretions, such as sweat, saliva, sputum, nasal mucus, vomit, urine, or feces, on them.  Wear gloves when handling laundry from the patient.  Read and follow directions on labels of laundry or clothing items and detergent. In general, wash and dry with the warmest temperatures recommended on the label.  Clean all areas the individual has used often  Clean all touchable surfaces, such as counters, tabletops, doorknobs, bathroom fixtures, toilets, phones, keyboards, tablets, and bedside tables, every day. Also, clean any surfaces that may have blood, body fluids, and/or secretions or excretions on them.  Wear gloves when cleaning surfaces the patient has come in contact with.  Use a diluted bleach solution (e.g., dilute bleach with 1 part bleach and 10 parts water) or a household disinfectant with a label that says EPA-registered for coronaviruses. To make a bleach solution at home, add 1 tablespoon of bleach to 1 quart (4 cups) of water. For a larger supply, add  cup of bleach to 1 gallon (16 cups) of water.  Read labels of cleaning products and follow recommendations provided on product labels. Labels contain instructions for safe and effective use of the cleaning product including precautions you should take when applying the product, such as wearing gloves or eye protection and making sure you have good ventilation during use of the product.  Remove gloves and wash hands immediately after cleaning.  Monitor yourself for signs and symptoms of illness Caregivers and household members are considered close contacts, should monitor their health, and will be asked to limit movement outside of the home to the extent possible. Follow the monitoring steps for close contacts listed on the symptom monitoring form.   ? If you have additional questions, contact your local health department or call the epidemiologist on call at [phone] (available 24/7). ? This  guidance is subject to change. For the most up-to-date guidance from [facility], please refer to their website: YouBlogs.pl  Information on my medicine - XARELTO (rivaroxaban)  This medication education was reviewed with me or my healthcare representative as part of my discharge preparation.  The pharmacist that spoke with me during my hospital stay was:  Arty Baumgartner, [place]? Xarelto was prescribed to treat blood clots that may have been found in the veins of your legs (deep vein thrombosis) or in your lungs (pulmonary embolism) and to reduce the risk of them occurring again.  What do you need to know about Xarelto? The starting dose is one 15 mg tablet taken TWICE daily with food for the FIRST 21 DAYS then on Sep 01, 2019  the dose is changed to one 20 mg tablet taken ONCE A DAY with your evening meal.  DO NOT stop taking Xarelto without talking to the health care provider who prescribed the medication.  Refill your prescription for 20 mg tablets before you  run out.  After discharge, you should have regular check-up appointments with your healthcare provider that is prescribing your Xarelto.  In the future your dose may need to be changed if your kidney function changes by a significant amount.  What do you do if you miss a dose? If you are taking Xarelto TWICE DAILY and you miss a dose, take it as soon as you remember. You may take two 15 mg tablets (total 30 mg) at the same time then resume your regularly scheduled 15 mg twice daily the next day.  If you are taking Xarelto ONCE DAILY and you miss a dose, take it as soon as you remember on the same day then continue your regularly scheduled once daily regimen the next day. Do not take two doses of Xarelto at the same time.   Important Safety Information Xarelto is a blood thinner medicine that can cause bleeding. You should call your  healthcare provider right away if you experience any of the following: ? Bleeding from an injury or your nose that does not stop. ? Unusual colored urine (red or dark brown) or unusual colored stools (red or black). ? Unusual bruising for unknown reasons. ? A serious fall or if you hit your head (even if there is no bleeding).  Some medicines may interact with Xarelto and might increase your risk of bleeding while on Xarelto. To help avoid this, consult your healthcare provider or pharmacist prior to using any new prescription or non-prescription medications, including herbals, vitamins, non-steroidal anti-inflammatory drugs (NSAIDs) and supplements.  This website has more information on Xarelto: [email or web address]-benson.[email or web address]

## 2019-08-13 NOTE — Discharge Summary (Signed)
Logan Harrison:[id number] DOB: 1955-08-05 DOA: 08/08/2019  PCP: [facility]  Admit date: 08/08/2019  Discharge date: 08/13/2019  Admitted From: Home   Disposition:  Home   Recommendations for Outpatient Follow-up:   Follow up with PCP in 1-2 weeks  PCP Please obtain BMP/CBC, 2 view CXR in 1week,  (see Discharge instructions)   PCP Please follow up on the following pending results: follow clinically PE, Xaralto dose, needs outpatient cardiology follow-up.   Home Health: None   Equipment/Devices:   Consultations: Cards Dr. Virgina Jock Discharge Condition: Stable    CODE STATUS: Full    Diet Recommendation: Heart healthy  Diet Order            Diet - low sodium heart healthy        Diet Heart Room service appropriate? Yes; Fluid consistency: Thin  Diet effective now               Chief Complaint  Patient presents with  . Chest Pain     Brief history of present illness from the day of admission and additional interim summary    Patient is a 64 y.o. male with PMHx of prostate cancer s/p prostatectomy, remote history of lower extremity DVT when he was in high school-recently diagnosed with COVID-19 on 4/7-presenting with cough, left-sided chest pain-further evaluation revealed acute hypoxemic respiratory failure secondary to pulmonary embolism and COVID-19 pneumonia.  Significant Events: 4/7>> COVID-19 positive 4/21>> admit to [facility] for hypoxemia secondary to PE and COVID-19 pneumonia 4/22>> lower extremity Doppler-positive for DVT 4/22>> Echo-EF 30-35%                                                                 Hospital Course   Acute Hypoxic Resp Failure due to Covid 19 Viral pneumonia and pulmonary embolism:  He had moderate to severe disease in the setting of acute [id number]-[id number]  pneumonia complicated by acute PE.  He was appropriately being treated with IV steroids and remdesivir and is now completely symptom-free on room air for the last 2 days, he has finished all his Covid related treatment, has been anticoagulated with Xarelto on which she will be discharged home, recommend minimum 9 months of anticoagulation thereafter but PCP based on his assessment of risk factors and risk versus benefits evaluation. .  Recent Labs  Lab 08/08/19 1717 08/08/19 1741 08/08/19 1741 08/08/19 1956 08/09/19 0759 08/10/19 0544 08/11/19 0235 08/12/19 0816 08/13/19 0258  CRP  --  18.1*   < >  --  20.2* 17.5* 9.1* 3.2* 1.5*  DDIMER  --  >20.00*   < >  --  15.08* 9.60* 7.11* 7.92* 3.83*  FERRITIN  --  4,920*  --   --  2,256* 2,320* 2,156*  --   --   BNP 109.9*  --   --   --   --   --  93.5 342.1* 85.1  PROCALCITON  --  0.17  --   --   --   --  0.78 0.18 0.21  SARSCOV2NAA  --   --   --  POSITIVE*  --   --   --   --   --    < > = values in this interval not displayed.    Hepatic Function Latest Ref Rng & Units 08/13/2019 08/12/2019 08/11/2019  Total Protein 6.5 - 8.1 g/dL 5.4(L) 6.0(L) 5.7(L)  Albumin 3.5 - 5.0 g/dL 2.1(L) 2.2(L) 2.1(L)  AST 15 - 41 U/L 38 45(H) 94(H)  ALT 0 - 44 U/L 119(H) 144(H) 168(H)  Alk Phosphatase 38 - 126 U/L 104 114 123  Total Bilirubin 0.3 - 1.2 mg/dL 0.9 0.8 0.6    Left lower extremity DVT with PE: Has history of previously provoked DVT as well, currently on Xarelto see #1 above.  CKD 3 baseline creatinine around 1.4 going back to 2015 in the chart: Stable at baseline.  Transaminitis: Mild asymptomatic with good liver synthetic function, INR 1.1 upon admission.  This is due to viral infection, stable will trend.  Leukocytosis: Secondary to steroid use-clinically improving without any suggestion of bacterial infection.  Follow.  Newly diagnosed chronic systolic heart failure (EF 30-35% by echo on 4/22): He is euvolemic on exam-suspect this was a  chronic finding and not related to COVID-19 infection.  Cardiology was consulted over the phone (Dr. Virgina Jock) who reviewed chart-he has arranged outpatient follow-up.  Continue low-dose Coreg for now.  LBBB: Per patient this is a chronic issue-he has a remote history of being referred to cardiology (approximately 10 years back) for this.  History of prostate cancer s/p prostatectomy - no acute issues follow with PCP.   Discharge diagnosis     Principal Problem:   Acute pulmonary embolus (HCC) Active Problems:   Pneumonia due to COVID-19 virus   Bilateral pulmonary embolism (HCC)   Acute respiratory failure due to COVID-19 (HCC)   Transaminitis   Serum total bilirubin elevated    Discharge instructions    Discharge Instructions    Diet - low sodium heart healthy   Complete by: As directed    Discharge instructions   Complete by: As directed    Follow with Primary MD [facility] in 7 days   Get CBC, CMP, 2 view Chest X ray -  checked next visit within 1 week by Primary MD   Activity: As tolerated with Full fall precautions use walker/cane & assistance as needed  Disposition Home    Diet: Heart Healthy     Special Instructions: If you have smoked or chewed Tobacco  in the last 2 yrs please stop smoking, stop any regular Alcohol  and or any Recreational drug use.  On your next visit with your primary care physician please Get Medicines reviewed and adjusted.  Please request your Prim.MD to go over all Hospital Tests and Procedure/Radiological results at the follow up, please get all Hospital records sent to your Prim MD by signing hospital release before you go home.  If you experience worsening of your admission symptoms, develop shortness of breath, life threatening emergency, suicidal or homicidal thoughts you must seek medical attention immediately by calling 911 or calling your MD immediately  if symptoms less severe.  You Must read complete  instructions/literature along with all the possible adverse reactions/side effects for all the Medicines you take and that have been prescribed to you. Take any new Medicines  after you have completely understood and accpet all the possible adverse reactions/side effects.   Increase activity slowly   Complete by: As directed    MyChart COVID-19 home monitoring program   Complete by: Aug 13, 2019    Is the patient willing to use the [place] for home monitoring?: Yes   Temperature monitoring   Complete by: Aug 13, 2019    After how many days would you like to receive a notification of this patient's flowsheet entries?: 1      Discharge Medications   Allergies as of 08/13/2019   No Known Allergies     Medication List    TAKE these medications   albuterol 108 (90 Base) MCG/ACT inhaler Commonly known as: VENTOLIN HFA Inhale 2 puffs into the lungs every 4 (four) hours as needed for wheezing or shortness of breath.   carvedilol 3.125 MG tablet Commonly known as: COREG Take 1 tablet (3.125 mg total) by mouth 2 (two) times daily with a meal.   Rivaroxaban 15 MG Tabs tablet Commonly known as: XARELTO Take 1 tablet (15 mg total) by mouth 2 (two) times daily with a meal for 17 days.   rivaroxaban 20 MG Tabs tablet Commonly known as: XARELTO Take 1 tablet (20 mg total) by mouth daily with supper. Start taking on: Aug 31, 2019       Follow-up Information    Patwardhan, Reynold Bowen, MD. Schedule an appointment as soon as possible for a visit in 2 day(s).   Specialties: Cardiology, Radiology Contact information: [address] [place] [address] [phone]        [facility]. Schedule an appointment as soon as possible for a visit in 1 week(s).   Specialty: Family Medicine Contact information: [address] [place] [address] [phone]           Major procedures and Radiology Reports - PLEASE review detailed and final  reports thoroughly  -       CT Angio Chest PE W/Cm &/Or Wo Cm  Result Date: 08/08/2019 CLINICAL DATA:  64 year old male with chest pain and shortness of breath. Recent positive COVID-19 on 07/25/2019. EXAM: CT ANGIOGRAPHY CHEST WITH CONTRAST TECHNIQUE: Multidetector CT imaging of the chest was performed using the standard protocol during bolus administration of intravenous contrast. Multiplanar CT image reconstructions and MIPs were obtained to evaluate the vascular anatomy. CONTRAST:  148m OMNIPAQUE IOHEXOL 350 MG/ML SOLN COMPARISON:  Chest radiograph dated 08/08/2019 and CT of the abdomen pelvis dated 08/05/2019. FINDINGS: Cardiovascular: There is no cardiomegaly or pericardial effusion. There is straightening of the interventricular septum with near equal greatest ventricular lumen diameter each measuring approximately 4.3 cm and ratio of 1. Findings suggestive of a degree of right heart straining. The thoracic aorta is unremarkable. The origins of the great vessels of the aortic arch appear patent. Large bilateral central pulmonary artery emboli extending into the lobar and segmental branches. Mediastinum/Nodes: No hilar or mediastinal adenopathy. The esophagus and thyroid gland are grossly unremarkable. No mediastinal fluid collection. Lungs/Pleura: Bilateral confluent airspace opacity primarily involving the peripheral and subpleural lungs most consistent with multifocal pneumonia and in keeping with COVID-19. There is no pleural effusion or pneumothorax. The central airways are patent. Upper Abdomen: Diffuse fatty liver. Cholecystectomy. Loose stool within the colon compatible with diarrheal state. Musculoskeletal: No chest wall abnormality. No acute or significant osseous findings. Review of the MIP images confirms the above findings. IMPRESSION: 1. Positive for acute PE with  CTevidence of right heart strain (RV/LV Ratio = 1) consistent with at least submassive (intermediate risk) PE. The presence of  right heart strain has been associated with an increased risk of morbidity and mortality. 2. Multifocal pneumonia and in keeping with COVID-19. 3. Fatty liver. 4. Diarrheal state. These results were called by telephone at the time of interpretation on 08/08/2019 at 7:31 pm to provider [facility] , who verbally acknowledged these results. Electronically Signed   By: Anner Crete M.D.   On: 08/08/2019 19:44   CT ABDOMEN PELVIS W CONTRAST  Result Date: 08/05/2019 CLINICAL DATA:  Diarrhea. Elevated liver enzymes and pancreatic enzymes. EXAM: CT ABDOMEN AND PELVIS WITH CONTRAST TECHNIQUE: Multidetector CT imaging of the abdomen and pelvis was performed using the standard protocol following bolus administration of intravenous contrast. CONTRAST:  50m OMNIPAQUE IOHEXOL 300 MG/ML  SOLN COMPARISON:  None. FINDINGS: Lower chest: Patchy ground-glass airspace opacities dependently in the lower lobes as well as in the posterior lingula. Cannot exclude pneumonia or COVID pneumonia. No effusions. Heart is normal size. Hepatobiliary: Mild diffuse fatty infiltration of the liver. No focal hepatic abnormality. Prior cholecystectomy. Pancreas: No focal abnormality or ductal dilatation. Spleen: No focal abnormality.  Normal size. Adrenals/Urinary Tract: Atrophic left kidney. No renal or adrenal mass. No hydronephrosis or stones. Urinary bladder unremarkable. Stomach/Bowel: Mildly prominent small bowel loops in the left upper quadrant. Other small bowel loops are fluid-filled. No well-defined focal transition zone. I favor this represents mild focal ileus. Stomach and large bowel unremarkable. Vascular/Lymphatic: No evidence of aneurysm or adenopathy. Scattered aortic atherosclerosis. Reproductive: No visible focal abnormality. Other: No free fluid or free air. Musculoskeletal: No acute bony abnormality. Degenerative facet disease in the lower lumbar spine. Associated slight anterolisthesis of L5 on S1. IMPRESSION: Mild  diffuse fatty infiltration of the liver. Few prominent small bowel loops in the left upper quadrant, favor mild focal ileus rather than bowel obstruction. Aortic atherosclerosis. Prior cholecystectomy. Mildly atrophic left kidney. Electronically Signed   By: KRolm BaptiseM.D.   On: 08/05/2019 22:58   DG Chest Port 1 View  Result Date: 08/08/2019 CLINICAL DATA:  Left chest pain and shortness of breath, recent COVID-19 positive 07/25/2019 EXAM: PORTABLE CHEST 1 VIEW COMPARISON:  12/28/2011, 08/05/2019 FINDINGS: Single frontal view of the chest demonstrates an unremarkable cardiac silhouette. There is patchy bilateral ground-glass airspace disease, left greater than right. No effusion or pneumothorax. No acute bony abnormality. IMPRESSION: 1. Multifocal pneumonia, certainly compatible with COVID-19. Electronically Signed   By: MRanda NgoM.D.   On: 08/08/2019 17:52   ECHOCARDIOGRAM COMPLETE  Result Date: 08/09/2019    ECHOCARDIOGRAM REPORT   Patient Name:   Logan Harrison Date of Exam: 08/09/2019 Medical Rec #:  [id number]       Height:       70.0 in Accession #:    [id number]      Weight:       195.5 lb Date of Birth:  711-Nov-1957       BSA:          2.067 m Patient Age:    89years         BP:           107/79 mmHg Patient Gender: M                HR:           95 bpm. Exam Location:  Inpatient Procedure: 2D Echo, Cardiac Doppler and Color  Doppler Indications:    Pulmonary embolism 415.19  History:        Patient has no prior history of Echocardiogram examinations.                 Risk Factors:Non-Smoker. Pulmonary embolism.  Sonographer:    Vickie Epley RDCS Referring Phys: [id number] [place] T TU  Sonographer Comments: Covid positive. IMPRESSIONS  1. Left ventricular ejection fraction, by estimation, is 30 to 35%. The left ventricle has moderately decreased function. The left ventricle demonstrates regional wall motion abnormalities with severe septal hypokinesis and septal-lateral dyssynchrony consistent  with LBBB. There is mild left ventricular hypertrophy. Left ventricular diastolic parameters are consistent with Grade I diastolic dysfunction (impaired relaxation).  2. Right ventricular systolic function is normal. The right ventricular size is normal. Tricuspid regurgitation signal is inadequate for assessing PA pressure.  3. The mitral valve is normal in structure. No evidence of mitral valve regurgitation. No evidence of mitral stenosis.  4. The aortic valve is tricuspid. Aortic valve regurgitation is not visualized. No aortic stenosis is present.  5. The inferior vena cava is normal in size with <50% respiratory variability, suggesting right atrial pressure of 8 mmHg. FINDINGS  Left Ventricle: Left ventricular ejection fraction, by estimation, is 30 to 35%. The left ventricle has moderately decreased function. The left ventricle demonstrates regional wall motion abnormalities. The left ventricular internal cavity size was normal in size. There is mild left ventricular hypertrophy. Left ventricular diastolic parameters are consistent with Grade I diastolic dysfunction (impaired relaxation). Right Ventricle: The right ventricular size is normal. No increase in right ventricular wall thickness. Right ventricular systolic function is normal. Tricuspid regurgitation signal is inadequate for assessing PA pressure. Left Atrium: Left atrial size was normal in size. Right Atrium: Right atrial size was normal in size. Pericardium: There is no evidence of pericardial effusion. Mitral Valve: The mitral valve is normal in structure. There is mild calcification of the mitral valve leaflet(s). No evidence of mitral valve regurgitation. No evidence of mitral valve stenosis. Tricuspid Valve: The tricuspid valve is normal in structure. Tricuspid valve regurgitation is not demonstrated. Aortic Valve: The aortic valve is tricuspid. Aortic valve regurgitation is not visualized. No aortic stenosis is present. Pulmonic Valve: The  pulmonic valve was normal in structure. Pulmonic valve regurgitation is not visualized. Aorta: The aortic root is normal in size and structure. Venous: The inferior vena cava is normal in size with less than 50% respiratory variability, suggesting right atrial pressure of 8 mmHg. IAS/Shunts: No atrial level shunt detected by color flow Doppler.  LEFT VENTRICLE PLAX 2D LVIDd:         4.60 cm LVIDs:         4.00 cm LV PW:         0.80 cm LV IVS:        0.80 cm LVOT diam:     2.20 cm LV SV:         67 LV SV Index:   33 LVOT Area:     3.80 cm  LV Volumes (MOD) LV vol d, MOD A2C: 192.0 ml LV vol d, MOD A4C: 171.0 ml LV vol s, MOD A2C: 131.0 ml LV vol s, MOD A4C: 119.0 ml LV SV MOD A2C:     61.0 ml LV SV MOD A4C:     171.0 ml LV SV MOD BP:      61.2 ml RIGHT VENTRICLE RV S prime:     10.40 cm/s TAPSE (M-mode): 1.7 cm  LEFT ATRIUM             Index       RIGHT ATRIUM           Index LA diam:        3.20 cm 1.55 cm/m  RA Area:     10.60 cm LA Vol (A2C):   43.3 ml 20.95 ml/m RA Volume:   23.80 ml  11.51 ml/m LA Vol (A4C):   33.3 ml 16.11 ml/m LA Biplane Vol: 39.8 ml 19.25 ml/m  AORTIC VALVE LVOT Vmax:   107.00 cm/s LVOT Vmean:  65.400 cm/s LVOT VTI:    0.177 m  AORTA Ao Root diam: 3.50 cm Ao Asc diam:  3.50 cm  SHUNTS Systemic VTI:  0.18 m Systemic Diam: 2.20 cm Loralie Champagne MD Electronically signed by Loralie Champagne MD Signature Date/Time: 08/09/2019/3:38:21 PM    Final    VAS [place] LOWER EXTREMITY VENOUS (DVT)  Result Date: 08/09/2019  Lower Venous DVTStudy Indications: Pulmonary embolism, and COVID.  Comparison Study: no prior Performing Technologist: June Leap RDMS, RVT  Examination Guidelines: A complete evaluation includes B-mode imaging, spectral Doppler, color Doppler, and power Doppler as needed of all accessible portions of each vessel. Bilateral testing is considered an integral part of a complete examination. Limited examinations for reoccurring indications may be performed as noted. The reflux portion of  the exam is performed with the patient in reverse Trendelenburg.  +---------+---------------+---------+-----------+----------+--------------+ RIGHT    CompressibilityPhasicitySpontaneityPropertiesThrombus Aging +---------+---------------+---------+-----------+----------+--------------+ CFV      Full           Yes      Yes                                 +---------+---------------+---------+-----------+----------+--------------+ SFJ      Full                                                        +---------+---------------+---------+-----------+----------+--------------+ FV Prox  Full                                                        +---------+---------------+---------+-----------+----------+--------------+ FV Mid   Full                                                        +---------+---------------+---------+-----------+----------+--------------+ FV DistalFull                                                        +---------+---------------+---------+-----------+----------+--------------+ PFV      Full                                                        +---------+---------------+---------+-----------+----------+--------------+  POP      Full           Yes      Yes                                 +---------+---------------+---------+-----------+----------+--------------+ PTV      Full                                                        +---------+---------------+---------+-----------+----------+--------------+ PERO     Full                                                        +---------+---------------+---------+-----------+----------+--------------+   +---------+---------------+---------+-----------+----------+--------------+ LEFT     CompressibilityPhasicitySpontaneityPropertiesThrombus Aging +---------+---------------+---------+-----------+----------+--------------+ CFV      Full           Yes      Yes                                  +---------+---------------+---------+-----------+----------+--------------+ SFJ      Full                                                        +---------+---------------+---------+-----------+----------+--------------+ FV Prox  Full                                                        +---------+---------------+---------+-----------+----------+--------------+ FV Mid   Full                                                        +---------+---------------+---------+-----------+----------+--------------+ FV DistalFull                                                        +---------+---------------+---------+-----------+----------+--------------+ PFV      Full                                                        +---------+---------------+---------+-----------+----------+--------------+ POP      Full           Yes      Yes                                 +---------+---------------+---------+-----------+----------+--------------+  PTV      None                                         Acute          +---------+---------------+---------+-----------+----------+--------------+ PERO     None                                         Acute          +---------+---------------+---------+-----------+----------+--------------+     Summary: RIGHT: - There is no evidence of deep vein thrombosis in the lower extremity.  - No cystic structure found in the popliteal fossa.  LEFT: - Findings consistent with acute deep vein thrombosis involving the left posterior tibial veins, and left peroneal veins. - No cystic structure found in the popliteal fossa.  *See table(s) above for measurements and observations. Electronically signed by Monica Martinez MD on 08/09/2019 at 8:10:21 PM.    Final     Micro Results     Recent Results (from the past 240 hour(s))  Blood Culture (routine x 2)     Status: None   Collection Time: 08/08/19  5:17 PM   Specimen: BLOOD    Result Value Ref Range Status   Specimen Description   Final    BLOOD RIGHT ANTECUBITAL Performed at [facility], [address] [place], [place] [address]    Special Requests   Final    BOTTLES DRAWN AEROBIC AND ANAEROBIC Blood Culture adequate volume Performed at [facility], [address] [place], [place] [address]    Culture   Final    NO GROWTH 5 DAYS Performed at [facility], [place] [address], [place], [place] [address]    Report Status 08/13/2019 FINAL  Final  Blood Culture (routine x 2)     Status: None   Collection Time: 08/08/19  5:41 PM   Specimen: BLOOD RIGHT HAND  Result Value Ref Range Status   Specimen Description   Final    BLOOD RIGHT HAND Performed at [facility], [place] [address], [place], [place] [address]    Special Requests   Final    BOTTLES DRAWN AEROBIC AND ANAEROBIC Blood Culture adequate volume Performed at [facility], [address] [place], [place] [address]    Culture   Final    NO GROWTH 5 DAYS Performed at [facility], [place] [address] [place], [place] [address]    Report Status 08/13/2019 FINAL  Final  Respiratory Panel by RT PCR (Flu A&B, Covid) - Nasopharyngeal Swab     Status: Abnormal   Collection Time: 08/08/19  7:56 PM   Specimen: Nasopharyngeal Swab  Result Value Ref Range Status   SARS Coronavirus 2 by RT PCR POSITIVE (A) NEGATIVE Final    Comment: RESULT CALLED TO, READ BACK BY AND VERIFIED WITH: ADKINS,L AT 2100 ON [id number] BY CHERESNOWSKY,T (NOTE) SARS-CoV-2 target nucleic acids are DETECTED. SARS-CoV-2 RNA is generally detectable in upper respiratory specimens  during the acute phase of infection. Positive results are indicative of the presence of the identified virus, but do not rule out bacterial infection or co-infection with other pathogens not detected by the test. Clinical correlation with patient history and other diagnostic information is  necessary to determine  patient infection status. The expected result is Negative. Fact Sheet for Patients:  PinkCheek.be Fact Sheet for Healthcare Providers: GravelBags.it This test is not yet approved or cleared by the [place] FDA and  has been authorized for detection and/or diagnosis of SARS-CoV-2 by FDA under an Emergency Use Authorization (EUA).  This EUA will remain in effect (meaning this test can b e used) for the duration of  the COVID-19 declaration under Section 564(b)(1) of the Act, 21 U.S.C. section 360bbb-3(b)(1), unless the authorization is terminated or revoked sooner.    Influenza A by PCR NEGATIVE NEGATIVE Final   Influenza B by PCR NEGATIVE NEGATIVE Final    Comment: (NOTE) The Xpert Xpress SARS-CoV-2/FLU/RSV assay is intended as an aid in  the diagnosis of influenza from Nasopharyngeal swab specimens and  should not be used as a sole basis for treatment. Nasal washings and  aspirates are unacceptable for Xpert Xpress SARS-CoV-2/FLU/RSV  testing. Fact Sheet for Patients: PinkCheek.be Fact Sheet for Healthcare Providers: GravelBags.it This test is not yet approved or cleared by the [place] FDA and  has been authorized for detection and/or diagnosis of SARS-CoV-2 by  FDA under an Emergency Use Authorization (EUA). This EUA will remain  in effect (meaning this test can be used) for the duration of the  Covid-19 declaration under Section 564(b)(1) of the Act, 21  U.S.C. section 360bbb-3(b)(1), unless the authorization is  terminated or revoked. Performed at [facility], [address] [place], [place] [address]     Today   Subjective    Dirck Butch today has no headache,no chest abdominal pain,no new weakness tingling or numbness, feels much better wants to go home today.    Objective   Blood pressure 102/62, pulse 71, temperature 97.7 F  (36.5 C), temperature source Oral, resp. rate 18, height _0  (1.778 m), weight 88.7 kg, SpO2 94 %.   Intake/Output Summary (Last 24 hours) at 08/13/2019 0943 Last data filed at 08/12/2019 2000 Gross per 24 hour  Intake 360 ml  Output --  Net 360 ml    Exam  Awake Alert , No new F.N deficits, Normal affect [place].AT,PERRAL Supple Neck,No JVD, No cervical lymphadenopathy appriciated.  Symmetrical Chest wall movement, Good air movement bilaterally, CTAB RRR,No Gallops,Rubs or new Murmurs, No Parasternal Heave +ve B.Sounds, Abd Soft, Non tender, No organomegaly appriciated, No rebound -guarding or rigidity. No Cyanosis, Clubbing or edema, No new Rash or bruise   Data Review   CBC w Diff:  Lab Results  Component Value Date   WBC 11.8 (H) 08/13/2019   HGB 13.1 08/13/2019   HCT 39.2 08/13/2019   PLT 339 08/13/2019   LYMPHOPCT 9 08/08/2019   MONOPCT 12 08/08/2019   EOSPCT 0 08/08/2019   BASOPCT 0 08/08/2019    CMP:  Lab Results  Component Value Date   NA 139 08/13/2019   K 4.4 08/13/2019   CL 104 08/13/2019   CO2 25 08/13/2019   BUN 31 (H) 08/13/2019   CREATININE 1.38 (H) 08/13/2019   PROT 5.4 (L) 08/13/2019   ALBUMIN 2.1 (L) 08/13/2019   BILITOT 0.9 08/13/2019   ALKPHOS 104 08/13/2019   AST 38 08/13/2019   ALT 119 (H) 08/13/2019  .   Total Time in preparing paper work, data evaluation and todays exam - 6 minutes  Lala Lund M.D on 08/13/2019 at 9:43 AM  [facility]   Office  [phone]

## 2019-08-22 NOTE — Progress Notes (Signed)
Cardiology Office Note:   Date:  08/23/2019  NAME:  Logan Harrison    MRN: [id number] DOB:  Jul 05, 1955   PCP:  [facility]  Cardiologist:  Evalina Field, MD   Referring MD: [facility]   Chief Complaint  Patient presents with  . Congestive Heart Failure   History of Present Illness:   Logan Harrison is a 64 y.o. male with a hx of prostate ca, LBBB, DVT who is being seen today for the evaluation of CHF at the request of [facility]. Admitted to [facility] for Covid PNA in April and found to have subclinical LV dysfunction, EF 30-35%. Also diagnosed with DVT that admission. Due to lack of symptoms, planned for outpatient work-up.   Reports at least a 10-year history of a left bundle branch block.  He reports that he did a stress test a number of years ago and it was normal.  He reports he was admitted for Covid and not cardiac issues.  It was complicated by pulmonary embolism.  EF as above.  He does report that he is not as active as he was before Covid.  He was walking up to 4 miles per day but is now walking three quarters of a mile.  He does get fatigued and short of breath.  He also endorses lower extremity edema.  He denies any chest pain or pressure.  There is a questionable history of sleep apnea.  He is a never smoker.  No recent thyroid studies in our system.  He reports no strong family history of congestive heart failure.  There is no strong family history of heart disease either.  He does have 1 son and he reports no medical problems in him.  No recent cholesterol profile.  Review of data from [facility] system in 2015 demonstrates total cholesterol 222, triglycerides 279, HDL 35, LDL 131.  He will need to repeat at some point her system.  Problem List 1. Systolic HF -EF 99991111 2. LBBB -QRS 160 ms 3. DVT -2/2 covid PNA 4. Prostate CA  Past Medical History: Past Medical History:  Diagnosis Date  . CHF (congestive heart  failure) ([place])   . Prostate cancer [facility])     Past Surgical History: Past Surgical History:  Procedure Laterality Date  . CHOLECYSTECTOMY    . PROSTATECTOMY      Current Medications: Current Meds  Medication Sig  . albuterol (VENTOLIN HFA) 108 (90 Base) MCG/ACT inhaler Inhale 2 puffs into the lungs every 4 (four) hours as needed for wheezing or shortness of breath.  . carvedilol (COREG) 3.125 MG tablet Take 1 tablet (3.125 mg total) by mouth 2 (two) times daily with a meal.  . Rivaroxaban (XARELTO) 15 MG TABS tablet Take 1 tablet (15 mg total) by mouth 2 (two) times daily with a meal for 17 days.  . [DISCONTINUED] carvedilol (COREG) 3.125 MG tablet Take 1 tablet (3.125 mg total) by mouth 2 (two) times daily with a meal.     Allergies:    Patient has no known allergies.   Social History: Social History   Socioeconomic History  . Marital status: Single    Spouse name: Not on file  . Number of children: Not on file  . Years of education: Not on file  . Highest education level: Not on file  Occupational History  . Not on file  Tobacco Use  . Smoking status: Never Smoker  . Smokeless tobacco:  Never Used  Substance and Sexual Activity  . Alcohol use: Yes    Comment: occ  . Drug use: Never  . Sexual activity: Not on file  Other Topics Concern  . Not on file  Social History Narrative  . Not on file   Social Determinants of Health   Financial Resource Strain:   . Difficulty of Paying Living Expenses:   Food Insecurity:   . Worried About Charity fundraiser in the Last Year:   . Arboriculturist in the Last Year:   Transportation Needs:   . Film/video editor (Medical):   Marland Kitchen Lack of Transportation (Non-Medical):   Physical Activity:   . Days of Exercise per Week:   . Minutes of Exercise per Session:   Stress:   . Feeling of Stress :   Social Connections:   . Frequency of Communication with Friends and Family:   . Frequency of Social Gatherings with Friends and  Family:   . Attends Religious Services:   . Active Member of Clubs or Organizations:   . Attends Archivist Meetings:   Marland Kitchen Marital Status:      Family History: The patient's family history includes Cancer in his father; Diabetes in his mother; Heart disease in his father; Hypertension in his mother.  ROS:   All other ROS reviewed and negative. Pertinent positives noted in the HPI.     EKGs/Labs/Other Studies Reviewed:   The following studies were personally reviewed by me today:  EKG:  EKG is ordered today.  The ekg ordered today demonstrates normal sinus rhythm, left bundle branch block, QRS 166., and was personally reviewed by me.  TTE 08/09/2019 1. Left ventricular ejection fraction, by estimation, is 30 to 35%. The  left ventricle has moderately decreased function. The left ventricle  demonstrates regional wall motion abnormalities with severe septal  hypokinesis and septal-lateral dyssynchrony  consistent with LBBB. There is mild left ventricular hypertrophy. Left  ventricular diastolic parameters are consistent with Grade I diastolic  dysfunction (impaired relaxation).  2. Right ventricular systolic function is normal. The right ventricular  size is normal. Tricuspid regurgitation signal is inadequate for assessing  PA pressure.  3. The mitral valve is normal in structure. No evidence of mitral valve  regurgitation. No evidence of mitral stenosis.  4. The aortic valve is tricuspid. Aortic valve regurgitation is not  visualized. No aortic stenosis is present.  5. The inferior vena cava is normal in size with <50% respiratory  variability, suggesting right atrial pressure of 8 mmHg.    EKG LBBB QRS 157 ms  Recent Labs: 08/13/2019: ALT 119; B Natriuretic Peptide 85.1; BUN 31; Creatinine, Ser 1.38; Hemoglobin 13.1; Magnesium 1.9; Platelets 339; Potassium 4.4; Sodium 139   Recent Lipid Panel    Component Value Date/Time   TRIG 143 08/08/2019 1741     Physical Exam:   VS:  BP 122/74   Pulse 90   Ht 5\' 10"  (1.778 m)   Wt 195 lb 3.2 oz (88.5 kg)   SpO2 96%   BMI 28.01 kg/m    Wt Readings from Last 3 Encounters:  08/23/19 195 lb 3.2 oz (88.5 kg)  08/08/19 195 lb 8 oz (88.7 kg)  08/05/19 190 lb (86.2 kg)    General: Well nourished, well developed, in no acute distress Heart: Atraumatic, normal size  Eyes: PEERLA, EOMI  Neck: Supple, no JVD Endocrine: No thryomegaly Cardiac: Normal S1, S2; RRR; no murmurs, rubs, or gallops Lungs: Clear  to auscultation bilaterally, no wheezing, rhonchi or rales  Abd: Soft, nontender, no hepatomegaly  Ext: 1+ pain edema Musculoskeletal: No deformities, BUE and BLE strength normal and equal Skin: Warm and dry, no rashes   Neuro: Alert and oriented to person, place, time, and situation, CNII-XII grossly intact, no focal deficits  Psych: Normal mood and affect   ASSESSMENT:   Logan Harrison is a 64 y.o. male who presents for the following: 1. Chronic systolic heart failure ([place])   2. LBBB (left bundle branch block)   3. Mixed hyperlipidemia     PLAN:   1. Chronic systolic heart failure (HCC), NYHA Class II/III, ACC/AHA Stage C, EF 30-35% -Unclear etiology.  Possibly related to left bundle branch block.  His QRS is around 170.  I do have concerns that he will recover his function on medical therapy.  His QRS is pretty wide he does have significant dyssynchrony.  Nonetheless, we will start to intensify medical therapy and see how he does.  We will continue Coreg 3.25 mg twice daily.  We will add Entresto 24-26 mg twice daily.  We will plan to see him back in 2 weeks for further titration.  We will add Lasix 20 mg daily. -We will complete work-up with TSH, iron profile, BNP as well as a repeat of his BMP today. -I have recommended a stress test for ischemia evaluation.  He is on Xarelto for recent pulmonary embolism within the last month.  I think a stress test will be okay given his lack of angina and  chronic history of left bundle branch block. -We will see him back in 2 weeks after the above testing for further management.  2. LBBB (left bundle branch block) -QRS around 170.  Possibly this is created a cardiomyopathy in him.  We will see how he does on medical therapy first.  3. Mixed hyperlipidemia -He will need a repeat lipid profile at some point.  Disposition: Return in about 2 weeks (around 09/06/2019).  Medication Adjustments/Labs and Tests Ordered: Current medicines are reviewed at length with the patient today.  Concerns regarding medicines are outlined above.  Orders Placed This Encounter  Procedures  . TSH  . Hepatic function panel  . Brain natriuretic peptide  . Basic metabolic panel  . MYOCARDIAL PERFUSION IMAGING  . EKG 12-Lead   Meds ordered this encounter  Medications  . carvedilol (COREG) 3.125 MG tablet    Sig: Take 1 tablet (3.125 mg total) by mouth 2 (two) times daily with a meal.    Dispense:  180 tablet    Refill:  1  . sacubitril-valsartan (ENTRESTO) 24-26 MG    Sig: Take 1 tablet by mouth 2 (two) times daily.    Dispense:  60 tablet    Patient Instructions  Medication Instructions:  Start Entresto twice daily  Start Lasix 20 mg daily  *If you need a refill on your cardiac medications before your next appointment, please call your pharmacy*   Lab Work: TSH, Iron profile, BNP, BMET today   If you have labs (blood work) drawn today and your tests are completely normal, you will receive your results only by: Marland Kitchen MyChart Message (if you have MyChart) OR . A paper copy in the mail If you have any lab test that is abnormal or we need to change your treatment, we will call you to review the results.   Testing/Procedures: Your physician has requested that you have a lexiscan myoview. A cardiac stress test is  a cardiological test that measures the heart's ability to respond to external stress in a controlled clinical environment. The stress response is  induced by intravenous pharmacological stimulation.    Follow-Up: At [facility], you and your health needs are our priority.  As part of our continuing mission to provide you with exceptional heart care, we have created designated Provider Care Teams.  These Care Teams include your primary Cardiologist (physician) and Advanced Practice Providers (APPs -  Physician Assistants and Nurse Practitioners) who all work together to provide you with the care you need, when you need it.  We recommend signing up for the patient portal called "MyChart".  Sign up information is provided on this After Visit Summary.  MyChart is used to connect with patients for Virtual Visits (Telemedicine).  Patients are able to view lab/test results, encounter notes, upcoming appointments, etc.  Non-urgent messages can be sent to your provider as well.   To learn more about what you can do with MyChart, go to [email or web address].    Your next appointment:   1 month(s)  The format for your next appointment:   In Person  Provider:   Eleonore Chiquito, MD   Other Instructions PharmD appointment in 2 weeks.   Thank you!      Signed, Addison Naegeli. Audie Box, [place]  [address], [place], [place] [address] [phone]  08/23/2019 2:48 PM

## 2019-08-23 ENCOUNTER — Other Ambulatory Visit: Payer: Self-pay

## 2019-08-23 ENCOUNTER — Encounter: Payer: Self-pay | Admitting: Cardiovascular Disease

## 2019-08-23 ENCOUNTER — Ambulatory Visit: Payer: Managed Care, Other (non HMO) | Admitting: Cardiovascular Disease

## 2019-08-23 VITALS — BP 122/74 | HR 90 | Ht 70.0 in | Wt 195.2 lb

## 2019-08-23 DIAGNOSIS — E782 Mixed hyperlipidemia: Secondary | ICD-10-CM | POA: Diagnosis not present

## 2019-08-23 DIAGNOSIS — I5022 Chronic systolic (congestive) heart failure: Secondary | ICD-10-CM

## 2019-08-23 DIAGNOSIS — I447 Left bundle-branch block, unspecified: Secondary | ICD-10-CM

## 2019-08-23 MED ORDER — SACUBITRIL-VALSARTAN 24-26 MG PO TABS: 1.0000 | ORAL_TABLET | Freq: Two times a day (BID) | ORAL | Status: DC

## 2019-08-23 MED ORDER — FUROSEMIDE 20 MG PO TABS
20.0000 mg | ORAL_TABLET | Freq: Every day | ORAL | 3 refills | Status: DC
Start: 2019-08-23 — End: 2019-10-16

## 2019-08-23 MED ORDER — CARVEDILOL 3.125 MG PO TABS: 3.1250 mg | ORAL_TABLET | Freq: Two times a day (BID) | ORAL | 1 refills | Status: DC

## 2019-08-23 NOTE — Patient Instructions (Addendum)
Medication Instructions:  Start Entresto twice daily  Start Lasix 20 mg daily  *If you need a refill on your cardiac medications before your next appointment, please call your pharmacy*   Lab Work: TSH, Iron profile, BNP, BMET today   If you have labs (blood work) drawn today and your tests are completely normal, you will receive your results only by: Marland Kitchen MyChart Message (if you have MyChart) OR . A paper copy in the mail If you have any lab test that is abnormal or we need to change your treatment, we will call you to review the results.   Testing/Procedures: Your physician has requested that you have a lexiscan myoview. A cardiac stress test is a cardiological test that measures the heart's ability to respond to external stress in a controlled clinical environment. The stress response is induced by intravenous pharmacological stimulation.    Follow-Up: At [facility], you and your health needs are our priority.  As part of our continuing mission to provide you with exceptional heart care, we have created designated Provider Care Teams.  These Care Teams include your primary Cardiologist (physician) and Advanced Practice Providers (APPs -  Physician Assistants and Nurse Practitioners) who all work together to provide you with the care you need, when you need it.  We recommend signing up for the patient portal called "MyChart".  Sign up information is provided on this After Visit Summary.  MyChart is used to connect with patients for Virtual Visits (Telemedicine).  Patients are able to view lab/test results, encounter notes, upcoming appointments, etc.  Non-urgent messages can be sent to your provider as well.   To learn more about what you can do with MyChart, go to [email or web address].    Your next appointment:   1 month(s)  The format for your next appointment:   In Person  Provider:   Eleonore Chiquito, MD   Other Instructions PharmD appointment in 2 weeks.   Thank you!

## 2019-08-24 LAB — BASIC METABOLIC PANEL
BUN/Creatinine Ratio: 19 (ref 10–24)
BUN: 21 mg/dL (ref 8–27)
CO2: 23 mmol/L (ref 20–29)
Calcium: 9.5 mg/dL (ref 8.6–10.2)
Chloride: 102 mmol/L (ref 96–106)
Creatinine, Ser: 1.13 mg/dL (ref 0.76–1.27)
GFR calc Af Amer: 80 mL/min/{1.73_m2} (ref >59)
GFR calc non Af Amer: 69 mL/min/{1.73_m2} (ref >59)
Glucose: 140 mg/dL — ABNORMAL HIGH (ref 65–99)
Potassium: 4.6 mmol/L (ref 3.5–5.2)
Sodium: 137 mmol/L (ref 134–144)

## 2019-08-24 LAB — TSH: TSH: 0.934 u[IU]/mL (ref 0.450–4.500)

## 2019-08-24 LAB — HEPATIC FUNCTION PANEL
ALT: 49 IU/L — ABNORMAL HIGH (ref 0–44)
AST: 30 IU/L (ref 0–40)
Albumin: 3.5 g/dL — ABNORMAL LOW (ref 3.8–4.8)
Alkaline Phosphatase: 110 IU/L (ref 39–117)
Bilirubin Total: 0.4 mg/dL (ref 0.0–1.2)
Bilirubin, Direct: 0.15 mg/dL (ref 0.00–0.40)
Total Protein: 6.5 g/dL (ref 6.0–8.5)

## 2019-08-24 LAB — BRAIN NATRIURETIC PEPTIDE: BNP: 40.6 pg/mL (ref 0.0–100.0)

## 2019-09-04 ENCOUNTER — Telehealth (HOSPITAL_COMMUNITY): Payer: Self-pay

## 2019-09-04 NOTE — Telephone Encounter (Signed)
Encounter complete. 

## 2019-09-05 ENCOUNTER — Encounter (HOSPITAL_COMMUNITY): Payer: Managed Care, Other (non HMO)

## 2019-09-05 NOTE — Progress Notes (Addendum)
Dyspnea  Cardiology Clinic Note   Patient Name: Logan Harrison Date of Encounter: 09/07/2019  Primary Care Provider:  [facility] Cardiologist:  Evalina Field, MD  Patient Profile    Logan Harrison 64 year old male presents today for follow-up evaluation of his CHF and ischemic evaluation.  Past Medical History    Past Medical History:  Diagnosis Date  . CHF (congestive heart failure) ([place])   . Prostate cancer [facility])    Past Surgical History:  Procedure Laterality Date  . CHOLECYSTECTOMY    . PROSTATECTOMY      Allergies  No Known Allergies  History of Present Illness    Logan Harrison has a PMH of prostate cancer, left bundle branch block, DVT, and CHF.  He was seen by Dr. Audie Box on 08/23/2019 at the request of [facility].  He also has a history of Covid pneumonia 4/21.  Subsequentially he was found to have an LVEF of 30-35% and was diagnosed with DVT during that admission.  Due to his lack of cardiac symptoms outpatient ischemic evaluation was planned.  He underwent nuclear stress test on 09/06/2019 which showed an EF of 35%, fixed perfusion defects in anterior, septal, and inferior walls consistent with prior infarct no ischemia noted.  He reported a 10-year history of LBBB.  Also during his 08/23/2019 visit he indicated that he had a stress test several years ago with a normal result.  He indicated that he was admitted for Covid and not for cardiac related reasons.  His course was complicated by PE and his reduced ejection fraction.  He reported that he was not as active as he was prior to his Covid infection.  He was walking up to 4 miles per day and is now only walking three quarters of a mile.  This is due to fatigue and increased work of breathing.  He was noted to have lower extremity edema but denied chest pain or pressure.  He was noted to have questionable history of OSA.  He also reported a strong family history of CHF.  He  reports the clinic today for further evaluation and states he feels well.  He has been slowly increasing his physical activity prior to his admission he was walking 4 miles per day and doing multiple calisthenic exercises.  He is currently walking a mile and a half a day and has been using rowing machine.  He states he lost around 30 pounds during his Covid infection and with his week of hospitalization.  His blood pressure today is 140/73.  I will increase his Entresto to the medium dose and order a BMP.  I will give him the salty 6 information sheet-follow-up in 1 month.  Today he denies chest pain, shortness of breath, lower extremity edema, fatigue, palpitations, melena, hematuria, hemoptysis, diaphoresis, weakness, presyncope, syncope, orthopnea, and PND.   Home Medications    Prior to Admission medications   Medication Sig Start Date End Date Taking? Authorizing Provider  albuterol (VENTOLIN HFA) 108 (90 Base) MCG/ACT inhaler Inhale 2 puffs into the lungs every 4 (four) hours as needed for wheezing or shortness of breath. 08/13/19   Thurnell Lose, MD  carvedilol (COREG) 3.125 MG tablet Take 1 tablet (3.125 mg total) by mouth 2 (two) times daily with a meal. 08/23/19   O'Neal, Cassie Freer, MD  furosemide (LASIX) 20 MG tablet Take 1 tablet (20 mg total) by mouth daily. 08/23/19 11/21/19  O'NealCassie Freer, MD  Rivaroxaban (XARELTO) 15 MG TABS tablet Take 1 tablet (15 mg total) by mouth 2 (two) times daily with a meal for 17 days. 08/13/19 08/30/19  Thurnell Lose, MD  rivaroxaban (XARELTO) 20 MG TABS tablet Take 1 tablet (20 mg total) by mouth daily with supper. Patient not taking: Reported on 08/23/2019 08/31/19   Thurnell Lose, MD  sacubitril-valsartan (ENTRESTO) 24-26 MG Take 1 tablet by mouth 2 (two) times daily. 08/23/19   O'Neal, Cassie Freer, MD    Family History    Family History  Problem Relation Age of Onset  . Diabetes Mother   . Hypertension Mother   . Cancer Father   .  Heart disease Father    He indicated that the status of his mother is unknown. He indicated that the status of his father is unknown.  Social History    Social History   Socioeconomic History  . Marital status: Single    Spouse name: Not on file  . Number of children: Not on file  . Years of education: Not on file  . Highest education level: Not on file  Occupational History  . Not on file  Tobacco Use  . Smoking status: Never Smoker  . Smokeless tobacco: Never Used  Substance and Sexual Activity  . Alcohol use: Yes    Comment: occ  . Drug use: Never  . Sexual activity: Not on file  Other Topics Concern  . Not on file  Social History Narrative  . Not on file   Social Determinants of Health   Financial Resource Strain:   . Difficulty of Paying Living Expenses:   Food Insecurity:   . Worried About Charity fundraiser in the Last Year:   . Arboriculturist in the Last Year:   Transportation Needs:   . Film/video editor (Medical):   Marland Kitchen Lack of Transportation (Non-Medical):   Physical Activity:   . Days of Exercise per Week:   . Minutes of Exercise per Session:   Stress:   . Feeling of Stress :   Social Connections:   . Frequency of Communication with Friends and Family:   . Frequency of Social Gatherings with Friends and Family:   . Attends Religious Services:   . Active Member of Clubs or Organizations:   . Attends Archivist Meetings:   Marland Kitchen Marital Status:   Intimate Partner Violence:   . Fear of Current or Ex-Partner:   . Emotionally Abused:   Marland Kitchen Physically Abused:   . Sexually Abused:      Review of Systems    General:  No chills, fever, night sweats or weight changes.  Cardiovascular:  No chest pain, dyspnea on exertion, edema, orthopnea, palpitations, paroxysmal nocturnal dyspnea. Dermatological: No rash, lesions/masses Respiratory: No cough, dyspnea Urologic: No hematuria, dysuria Abdominal:   No nausea, vomiting, diarrhea, bright red  blood per rectum, melena, or hematemesis Neurologic:  No visual changes, wkns, changes in mental status. All other systems reviewed and are otherwise negative except as noted above.  Physical Exam    VS:  BP 140/73   Pulse 71   Temp 98.1 F (36.7 C)   Ht 5\' 10"  (1.778 m)   Wt 195 lb 6.4 oz (88.6 kg)   SpO2 97%   BMI 28.04 kg/m  , BMI Body mass index is 28.04 kg/m. GEN: Well nourished, well developed, in no acute distress. HEENT: normal.  Was Neck: Supple, no JVD, carotid bruits, or masses. Cardiac: RRR,  no murmurs, rubs, or gallops. No clubbing, cyanosis, left generalized nonpitting ankle edema.  Radials/DP/PT 2+ and equal bilaterally.  Respiratory:  Respirations regular and unlabored, clear to auscultation bilaterally. GI: Soft, nontender, nondistended, BS + x 4. MS: no deformity or atrophy. Skin: warm and dry, no rash. Neuro:  Strength and sensation are intact. Psych: Normal affect.  Accessory Clinical Findings    ECG personally reviewed by me today-none today.  EKG 08/23/2019 Normal sinus rhythm, left bundle branch block, 90 bpm  Echocardiogram 08/09/2019 Sonographer Comments: Covid positive.  IMPRESSIONS    1. Left ventricular ejection fraction, by estimation, is 30 to 35%. The  left ventricle has moderately decreased function. The left ventricle  demonstrates regional wall motion abnormalities with severe septal  hypokinesis and septal-lateral dyssynchrony  consistent with LBBB. There is mild left ventricular hypertrophy. Left  ventricular diastolic parameters are consistent with Grade I diastolic  dysfunction (impaired relaxation).  2. Right ventricular systolic function is normal. The right ventricular  size is normal. Tricuspid regurgitation signal is inadequate for assessing  PA pressure.  3. The mitral valve is normal in structure. No evidence of mitral valve  regurgitation. No evidence of mitral stenosis.  4. The aortic valve is tricuspid. Aortic valve  regurgitation is not  visualized. No aortic stenosis is present.  5. The inferior vena cava is normal in size with <50% respiratory  variability, suggesting right atrial pressure of 8 mmHg.  Nuclear stress test 09/06/2019  Nuclear stress EF: 35%.  The left ventricular ejection fraction is moderately decreased (30-44%).  Defect 1: There is a large defect of moderate severity present in the basal anterior, basal anteroseptal, mid anterior, mid anteroseptal, apical anterior, apical septal, apical lateral and apex location.  Defect 2: There is a medium defect of moderate severity present in the basal inferoseptal, basal inferior, mid inferoseptal, mid inferior and apical inferior location.  Findings consistent with prior myocardial infarction.  This is an intermediate risk study. No ischemia   Fixed perfusion defects in anterior, septal, and inferior walls, consistent with prior infarcts.  No ischemia seen.  Assessment & Plan   1.  Chronic systolic heart failure-no increased work of breathing today.  Echocardiogram showed LVEF 30-35%, severe septal hypokinesis and septal-lateral dyssynchrony consistent with LBBB. There is mild left ventricular hypertrophy. Left ventricular diastolic parameters are consistent with Grade I diastolic dysfunction.  Nuclear stress test showed no ischemia, EF 35%, fixed perfusion defect in the anterior, septal, and inferior walls consistent with prior infarcts. Increase Entresto to 49/51 dosing. Heart healthy low-sodium diet Increase physical activity as tolerated Daily weights Lower extremity support stockings Order BMP today and again in 1 month.  Left bundle branch block-QRS 166.  Believed to be related to/caused by cardiomyopathy. Continue current medical therapy Continue to monitor  Mixed hyperlipidemia-08/08/2019: Triglycerides 143  Heart healthy low-sodium high-fiber diet Increase physical activity as tolerated   Follow-up with Dr. Audie Box in 1  month.  Jossie Ng. Nate Common NP-C    09/07/2019, 11:28 AM [place] [address] Office [phone] Fax [phone]

## 2019-09-06 ENCOUNTER — Other Ambulatory Visit: Payer: Self-pay

## 2019-09-06 ENCOUNTER — Ambulatory Visit (HOSPITAL_COMMUNITY)
Admission: RE | Admit: 2019-09-06 | Discharge: 2019-09-06 | Disposition: A | Discharge status: Home / Self Care | Payer: Managed Care, Other (non HMO) | Source: Ambulatory Visit | Attending: Cardiology | Admitting: Cardiology

## 2019-09-06 DIAGNOSIS — I5022 Chronic systolic (congestive) heart failure: Secondary | ICD-10-CM | POA: Insufficient documentation

## 2019-09-06 LAB — MYOCARDIAL PERFUSION IMAGING
LV dias vol: 164 mL (ref 62–150)
LV sys vol: 107 mL
Peak HR: 96 {beats}/min
Rest HR: 60 {beats}/min
SDS: 10
SRS: 11
SSS: 21
TID: 0.96

## 2019-09-06 MED ORDER — TECHNETIUM TC 99M TETROFOSMIN IV KIT
10.1000 | PACK | Freq: Once | INTRAVENOUS | Status: AC | PRN
  Administered 2019-09-06: 10.1 via INTRAVENOUS
  Filled 2019-09-06: qty 11

## 2019-09-06 MED ORDER — REGADENOSON 0.4 MG/5ML IV SOLN
0.4000 mg | Freq: Once | INTRAVENOUS | Status: AC
Start: 2019-09-06 — End: 2019-09-06
  Administered 2019-09-06: 0.4 mg via INTRAVENOUS

## 2019-09-06 MED ORDER — TECHNETIUM TC 99M TETROFOSMIN IV KIT
31.5000 | PACK | Freq: Once | INTRAVENOUS | Status: AC | PRN
  Administered 2019-09-06: 31.5 via INTRAVENOUS
  Filled 2019-09-06: qty 32

## 2019-09-07 ENCOUNTER — Ambulatory Visit: Payer: Managed Care, Other (non HMO) | Admitting: General Practice

## 2019-09-07 ENCOUNTER — Encounter: Payer: Self-pay | Admitting: General Practice

## 2019-09-07 VITALS — BP 140/73 | HR 71 | Temp 98.1°F | Ht 70.0 in | Wt 195.4 lb

## 2019-09-07 DIAGNOSIS — E782 Mixed hyperlipidemia: Secondary | ICD-10-CM

## 2019-09-07 DIAGNOSIS — Z79899 Other long term (current) drug therapy: Secondary | ICD-10-CM | POA: Diagnosis not present

## 2019-09-07 DIAGNOSIS — I447 Left bundle-branch block, unspecified: Secondary | ICD-10-CM | POA: Diagnosis not present

## 2019-09-07 DIAGNOSIS — I5022 Chronic systolic (congestive) heart failure: Secondary | ICD-10-CM

## 2019-09-07 MED ORDER — ENTRESTO 49-51 MG PO TABS: 1.0000 | ORAL_TABLET | Freq: Two times a day (BID) | ORAL | 6 refills | Status: DC

## 2019-09-07 NOTE — Patient Instructions (Signed)
Medication Instructions:  INCREASE ENTRESTO 49/51MG  DAILY *If you need a refill on your cardiac medications before your next appointment, please call your pharmacy*  Lab Work: BMET TODAY If you have labs (blood work) drawn today and your tests are completely normal, you will receive your results only by:  [place] (if you have MyChart) OR A paper copy in the mail.  If you have any lab test that is abnormal or we need to change your treatment, we will call you to review the results. You may go to any [facility] that is convenient for you however, we do have a lab in our office that is able to assist you. You DO NOT need an appointment for our lab. The lab is open 8:00am and closes at 4:00pm. Lunch 12:45 - 1:45pm.  Special Instructions PLEASE INCREASE PHYSICAL ACTIVITY AS TOLERATED   PLEASE READ AND FOLLOW SALTY 6-ATTACHED  Follow-Up: Your next appointment:  KEEP SCHEDULED APPOINTMENT  In Person with Eleonore Chiquito, MD.  At [facility], you and your health needs are our priority.  As part of our continuing mission to provide you with exceptional heart care, we have created designated Provider Care Teams.  These Care Teams include your primary Cardiologist (physician) and Advanced Practice Providers (APPs -  Physician Assistants and Nurse Practitioners) who all work together to provide you with the care you need, when you need it.

## 2019-09-08 LAB — BASIC METABOLIC PANEL
BUN/Creatinine Ratio: 16 (ref 10–24)
BUN: 21 mg/dL (ref 8–27)
CO2: 25 mmol/L (ref 20–29)
Calcium: 9.8 mg/dL (ref 8.6–10.2)
Chloride: 102 mmol/L (ref 96–106)
Creatinine, Ser: 1.29 mg/dL — ABNORMAL HIGH (ref 0.76–1.27)
GFR calc Af Amer: 68 mL/min/{1.73_m2} (ref >59)
GFR calc non Af Amer: 59 mL/min/{1.73_m2} — ABNORMAL LOW (ref >59)
Glucose: 89 mg/dL (ref 65–99)
Potassium: 4.8 mmol/L (ref 3.5–5.2)
Sodium: 140 mmol/L (ref 134–144)

## 2019-10-14 NOTE — Progress Notes (Signed)
Cardiology Office Note:   Date:  10/16/2019  NAME:  Logan Harrison    MRN: [id number] DOB:  1956/02/21   PCP:  [facility]  Cardiologist:  Evalina Field, MD   Referring MD: [facility]*   Chief Complaint  Patient presents with  . Congestive Heart Failure   History of Present Illness:   Logan Harrison is a 64 y.o. male with a hx of systolic HF, LBBB (QRS 751 ms), DVt, prostate CA. Diagnosed with CHF 07/2019. NM stress likely with LBBB artifact.  He reports he is doing well.  He denies any heart failure symptoms.  He is walking 4 miles per day at a brisk pace without any limitations such as chest pain or shortness of breath.  He reports that he does feel a little dizzy upon standing at times but overall tolerating his medications well.  I did inform him that he can take his Lasix as needed.  He has no noticeable weight gain.  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  He is tolerating Xarelto without any limitations.  Plans for 6 months for Xarelto.  We do need to increase his carvedilol and continue him on Entresto.  We did discuss his recent stress test which was likely normal and plagued by left bundle branch artifact.  He has no symptoms of angina.  I did inquire about his prior EKGs to know how wide his QRS was.  He will inquire about this to his PCP.  He does report that he is quite fatigued and falls asleep easily.  He apparently was tested for sleep apnea in the past and tested negative.  He does need to be retested.  Problem List 1. Systolic HF 10/15 -EF 49-44% -NM stress with LBBB artifact  2. LBBB -QRS 160 ms 3. DVT -2/2 covid PNA 4. Prostate CA  Past Medical History: Past Medical History:  Diagnosis Date  . CHF (congestive heart failure) ([place])   . Prostate cancer [facility])     Past Surgical History: Past Surgical History:  Procedure Laterality Date  . CHOLECYSTECTOMY    .  PROSTATECTOMY      Current Medications: Current Meds  Medication Sig  . albuterol (VENTOLIN HFA) 108 (90 Base) MCG/ACT inhaler Inhale 2 puffs into the lungs every 4 (four) hours as needed for wheezing or shortness of breath.  . carvedilol (COREG) 6.25 MG tablet Take 1 tablet (6.25 mg total) by mouth 2 (two) times daily with a meal.  . furosemide (LASIX) 20 MG tablet Take 1 tablet (20 mg total) by mouth daily as needed.  . rivaroxaban (XARELTO) 20 MG TABS tablet Take 1 tablet (20 mg total) by mouth daily with supper.  . sacubitril-valsartan (ENTRESTO) 49-51 MG Take 1 tablet by mouth 2 (two) times daily.  . [DISCONTINUED] carvedilol (COREG) 3.125 MG tablet Take 1 tablet (3.125 mg total) by mouth 2 (two) times daily with a meal.  . [DISCONTINUED] furosemide (LASIX) 20 MG tablet Take 1 tablet (20 mg total) by mouth daily.  . [DISCONTINUED] sacubitril-valsartan (ENTRESTO) 49-51 MG Take 1 tablet by mouth 2 (two) times daily.     Allergies:    Patient has no known allergies.   Social History: Social History   Socioeconomic History  . Marital status: Single    Spouse name: Not on file  . Number of children: Not on file  . Years of education: Not  on file  . Highest education level: Not on file  Occupational History  . Not on file  Tobacco Use  . Smoking status: Never Smoker  . Smokeless tobacco: Never Used  Vaping Use  . Vaping Use: Never used  Substance and Sexual Activity  . Alcohol use: Yes    Comment: occ  . Drug use: Never  . Sexual activity: Not on file  Other Topics Concern  . Not on file  Social History Narrative  . Not on file   Social Determinants of Health   Financial Resource Strain:   . Difficulty of Paying Living Expenses:   Food Insecurity:   . Worried About Charity fundraiser in the Last Year:   . Arboriculturist in the Last Year:   Transportation Needs:   . Film/video editor (Medical):   Marland Kitchen Lack of Transportation (Non-Medical):   Physical Activity:    . Days of Exercise per Week:   . Minutes of Exercise per Session:   Stress:   . Feeling of Stress :   Social Connections:   . Frequency of Communication with Friends and Family:   . Frequency of Social Gatherings with Friends and Family:   . Attends Religious Services:   . Active Member of Clubs or Organizations:   . Attends Archivist Meetings:   Marland Kitchen Marital Status:      Family History: The patient's family history includes Cancer in his father; Diabetes in his mother; Heart disease in his father; Hypertension in his mother.  ROS:   All other ROS reviewed and negative. Pertinent positives noted in the HPI.     EKGs/Labs/Other Studies Reviewed:   The following studies were personally reviewed by me today:   NM Stress 09/06/2019  Nuclear stress EF: 35%.  The left ventricular ejection fraction is moderately decreased (30-44%).  Defect 1: There is a large defect of moderate severity present in the basal anterior, basal anteroseptal, mid anterior, mid anteroseptal, apical anterior, apical septal, apical lateral and apex location.  Defect 2: There is a medium defect of moderate severity present in the basal inferoseptal, basal inferior, mid inferoseptal, mid inferior and apical inferior location.  Findings consistent with prior myocardial infarction.  This is an intermediate risk study. No ischemia   Fixed perfusion defects in anterior, septal, and inferior walls, consistent with prior infarcts.  No ischemia seen.  VAsc US 08/09/2019 Summary:  RIGHT:  - There is no evidence of deep vein thrombosis in the lower extremity.    - No cystic structure found in the popliteal fossa.    LEFT:  - Findings consistent with acute deep vein thrombosis involving the left  posterior tibial veins, and left peroneal veins.  - No cystic structure found in the popliteal fossa.   Echo 08/09/2019 1. Left ventricular ejection fraction, by estimation, is 30 to 35%. The  left  ventricle has moderately decreased function. The left ventricle  demonstrates regional wall motion abnormalities with severe septal  hypokinesis and septal-lateral dyssynchrony  consistent with LBBB. There is mild left ventricular hypertrophy. Left  ventricular diastolic parameters are consistent with Grade I diastolic  dysfunction (impaired relaxation).  2. Right ventricular systolic function is normal. The right ventricular  size is normal. Tricuspid regurgitation signal is inadequate for assessing  PA pressure.  3. The mitral valve is normal in structure. No evidence of mitral valve  regurgitation. No evidence of mitral stenosis.  4. The aortic valve is tricuspid. Aortic valve regurgitation  is not  visualized. No aortic stenosis is present.  5. The inferior vena cava is normal in size with <50% respiratory  variability, suggesting right atrial pressure of 8 mmHg.   Recent Labs: 08/13/2019: Hemoglobin 13.1; Magnesium 1.9; Platelets 339 08/23/2019: ALT 49; BNP 40.6; TSH 0.934 09/07/2019: BUN 21; Creatinine, Ser 1.29; Potassium 4.8; Sodium 140   Recent Lipid Panel    Component Value Date/Time   TRIG 143 08/08/2019 1741    Physical Exam:   VS:  BP 116/68   Pulse 74   Ht 5\' 10"  (1.778 m)   Wt 206 lb 3.2 oz (93.5 kg)   SpO2 97%   BMI 29.59 kg/m    Wt Readings from Last 3 Encounters:  10/16/19 206 lb 3.2 oz (93.5 kg)  09/07/19 195 lb 6.4 oz (88.6 kg)  09/06/19 195 lb (88.5 kg)    General: Well nourished, well developed, in no acute distress Heart: Atraumatic, normal size  Eyes: PEERLA, EOMI  Neck: Supple, no JVD Endocrine: No thryomegaly Cardiac: Normal S1, S2; RRR; no murmurs, rubs, or gallops Lungs: Clear to auscultation bilaterally, no wheezing, rhonchi or rales  Abd: Soft, nontender, no hepatomegaly  Ext: No edema, pulses 2+ Musculoskeletal: No deformities, BUE and BLE strength normal and equal Skin: Warm and dry, no rashes   Neuro: Alert and oriented to person,  place, time, and situation, CNII-XII grossly intact, no focal deficits  Psych: Normal mood and affect   ASSESSMENT:   Logan Harrison is a 64 y.o. male who presents for the following: 1. Chronic systolic heart failure ([place])   2. LBBB (left bundle branch block)   3. Mixed hyperlipidemia   4. Chronic fatigue   5. Deep vein thrombosis (DVT) of proximal lower extremity, unspecified chronicity, unspecified laterality (HCC)     PLAN:   1. Chronic systolic heart failure (HCC), EF 30-35%, LBBB QRS 160 ms, NYHA Class II, ACC/AHA Stage C 2. LBBB (left bundle branch block) -Nuclear medicine stress test with left bundle-branch artifact.  Possibly this is related to his left bundle branch block with a wide QRS around 160-170 ms.  He is tolerating guideline directed medical therapy.  We will increase his carvedilol to 6.25 mg twice daily.  Continue Entresto 49-51 twice daily.  He does describe some dizziness and lightheadedness with standing I do not think he will tolerate further titration of Entresto.  He is euvolemic on exam and we will reduce his Lasix to 20 mg daily as needed. -I will plan to see him back in 1 month for further titration of therapy.  He was diagnosed with heart failure in April 2021.  We will need 6 months of guideline directed medical therapy before recheck an echocardiogram.  Hopefully his EF will recover.  I do have my doubts that he will recover given his wide QRS and left bundle branch block.  He may ultimately need CRT therapy to recover.  We will give him a chance on medications.  I would like for him to bring his EKGs from his primary care physician.  I would like to know how long he has had a wide QRS and left bundle branch block.  This would also help to determine if CRT will actually benefit him.  3. Mixed hyperlipidemia -We will need a repeat lipid profile at some point  4. Chronic fatigue -He does report daily fatigue.  He has no evidence of volume overload.  He reports he  falls asleep easily.  He was tested  for sleep apnea in the past.  We will proceed with a repeat home sleep study.  5. Deep vein thrombosis (DVT) of proximal lower extremity, unspecified chronicity, unspecified laterality (HCC) -Acute DVT of the left posterior tibial veins and left peroneal veins (below-knee) in the setting of COVID-19 pneumonia.  This was in April 2021.  We will plan for 6 months of therapy and then stop.   Disposition: Return in about 1 month (around 11/15/2019).  Medication Adjustments/Labs and Tests Ordered: Current medicines are reviewed at length with the patient today.  Concerns regarding medicines are outlined above.  Orders Placed This Encounter  Procedures  . Home sleep test   Meds ordered this encounter  Medications  . sacubitril-valsartan (ENTRESTO) 49-51 MG    Sig: Take 1 tablet by mouth 2 (two) times daily.    Dispense:  60 tablet    Refill:  6  . carvedilol (COREG) 6.25 MG tablet    Sig: Take 1 tablet (6.25 mg total) by mouth 2 (two) times daily with a meal.    Dispense:  180 tablet    Refill:  1  . furosemide (LASIX) 20 MG tablet    Sig: Take 1 tablet (20 mg total) by mouth daily as needed.    Dispense:  90 tablet    Refill:  3    Patient Instructions  Medication Instructions:  Increase Coreg to 6.25 twice daily  Take Lasix 20 mg daily as needed  *If you need a refill on your cardiac medications before your next appointment, please call your pharmacy*   Testing/Procedures: Your physician has recommended that you have a sleep study. This test records several body functions during sleep, including: brain activity, eye movement, oxygen and carbon dioxide blood levels, heart rate and rhythm, breathing rate and rhythm, the flow of air through your mouth and nose, snoring, body muscle movements, and chest and belly movement.   Follow-Up: At [facility], you and your health needs are our priority.  As part of our continuing mission to provide you  with exceptional heart care, we have created designated Provider Care Teams.  These Care Teams include your primary Cardiologist (physician) and Advanced Practice Providers (APPs -  Physician Assistants and Nurse Practitioners) who all work together to provide you with the care you need, when you need it.  We recommend signing up for the patient portal called "MyChart".  Sign up information is provided on this After Visit Summary.  MyChart is used to connect with patients for Virtual Visits (Telemedicine).  Patients are able to view lab/test results, encounter notes, upcoming appointments, etc.  Non-urgent messages can be sent to your provider as well.   To learn more about what you can do with MyChart, go to [email or web address].    Your next appointment:   1 month(s)  The format for your next appointment:   In Person  Provider:   Eleonore Chiquito, MD        Time Spent with Patient: I have spent a total of 35 minutes with patient reviewing hospital notes, telemetry, EKGs, labs and examining the patient as well as establishing an assessment and plan that was discussed with the patient.  > 50% of time was spent in direct patient care.  Signed, Addison Naegeli. Audie Box, [place]  [address] [place], [place] [address] [phone]  10/16/2019 4:03 PM

## 2019-10-16 ENCOUNTER — Ambulatory Visit: Payer: Managed Care, Other (non HMO) | Admitting: Cardiovascular Disease

## 2019-10-16 ENCOUNTER — Other Ambulatory Visit: Payer: Self-pay

## 2019-10-16 ENCOUNTER — Encounter: Payer: Self-pay | Admitting: Cardiovascular Disease

## 2019-10-16 VITALS — BP 116/68 | HR 74 | Ht 70.0 in | Wt 206.2 lb

## 2019-10-16 DIAGNOSIS — E782 Mixed hyperlipidemia: Secondary | ICD-10-CM | POA: Diagnosis not present

## 2019-10-16 DIAGNOSIS — I5022 Chronic systolic (congestive) heart failure: Secondary | ICD-10-CM | POA: Diagnosis not present

## 2019-10-16 DIAGNOSIS — R5382 Chronic fatigue, unspecified: Secondary | ICD-10-CM

## 2019-10-16 DIAGNOSIS — I447 Left bundle-branch block, unspecified: Secondary | ICD-10-CM | POA: Diagnosis not present

## 2019-10-16 DIAGNOSIS — I824Y9 Acute embolism and thrombosis of unspecified deep veins of unspecified proximal lower extremity: Secondary | ICD-10-CM

## 2019-10-16 MED ORDER — FUROSEMIDE 20 MG PO TABS
20.0000 mg | ORAL_TABLET | Freq: Every day | ORAL | 3 refills | Status: DC | PRN
Start: 2019-10-16 — End: 2020-10-15

## 2019-10-16 MED ORDER — ENTRESTO 49-51 MG PO TABS: 1.0000 | ORAL_TABLET | Freq: Two times a day (BID) | ORAL | 6 refills | Status: DC

## 2019-10-16 MED ORDER — CARVEDILOL 6.25 MG PO TABS: 6.2500 mg | ORAL_TABLET | Freq: Two times a day (BID) | ORAL | 1 refills | Status: DC

## 2019-10-16 NOTE — Patient Instructions (Signed)
Medication Instructions:  Increase Coreg to 6.25 twice daily  Take Lasix 20 mg daily as needed  *If you need a refill on your cardiac medications before your next appointment, please call your pharmacy*   Testing/Procedures: Your physician has recommended that you have a sleep study. This test records several body functions during sleep, including: brain activity, eye movement, oxygen and carbon dioxide blood levels, heart rate and rhythm, breathing rate and rhythm, the flow of air through your mouth and nose, snoring, body muscle movements, and chest and belly movement.   Follow-Up: At [facility], you and your health needs are our priority.  As part of our continuing mission to provide you with exceptional heart care, we have created designated Provider Care Teams.  These Care Teams include your primary Cardiologist (physician) and Advanced Practice Providers (APPs -  Physician Assistants and Nurse Practitioners) who all work together to provide you with the care you need, when you need it.  We recommend signing up for the patient portal called "MyChart".  Sign up information is provided on this After Visit Summary.  MyChart is used to connect with patients for Virtual Visits (Telemedicine).  Patients are able to view lab/test results, encounter notes, upcoming appointments, etc.  Non-urgent messages can be sent to your provider as well.   To learn more about what you can do with MyChart, go to [email or web address].    Your next appointment:   1 month(s)  The format for your next appointment:   In Person  Provider:   Eleonore Chiquito, MD

## 2019-10-18 ENCOUNTER — Telehealth: Payer: Self-pay | Admitting: *Deleted

## 2019-10-18 NOTE — Telephone Encounter (Signed)
PA request for HST faxed to [facility].

## 2019-11-13 NOTE — Progress Notes (Signed)
Cardiology Office Note:   Date:  11/15/2019  NAME:  [name]    MRN: [id number] DOB:  [birth year]   PCP:  [facility]  Cardiologist:  Evalina Field, MD  Electrophysiologist:  None   Referring MD: [facility]*   Chief Complaint  Patient presents with  . Congestive Heart Failure   History of Present Illness:   Logan Harrison is a 64 y.o. male with a hx of systolic HF, LBBB (QRS 381 ms), DVT, prostate CA who presents for follow-up of systolic HF. Found to have reduced EF in setting of covid 19 infection 07/2019. LBBB with QRS 160 ms. NM Stress with LBBB artifact.   He reports he is doing well.  He is walking 4 miles every 5 days.  He describes no chest pain or shortness of breath with level activity.  He also is doing 400 push-ups every day.  He reports that he was getting some dizziness upon standing but this is subsided.  He overall has been doing quite well.  He is tolerating his Entresto and carvedilol well.  He did present with EKGs from his primary care physician office which demonstrated he has had a left bundle branch block for nearly 7 years.  He was noted to have very long QRS 174 ms back in 2014.  He appears to be unchanged from current QRS measurement.  He denies symptoms of chest pain, shortness of breath or palpitations today.  We did discuss the need to repeat a cardiac ultrasound after he has been on guideline directed medical therapy for total 6 months.  We will plan to do this before I see him in October.  Problem List 1. Systolic HF  -0/1751 -EF 02-58% -NM stress with LBBB artifact  2. LBBB -QRS 160 ms -QRS 170 (2014-2019 per old EKGs) 3. DVT  -2/2 covid PNA (07/2019) 4. Prostate CA 5. HLD -Labs from 2019 from PCP demonstrate total cholesterol 205, triglycerides 139, HDL 46, LDL 131  Past Medical History: Past Medical History:  Diagnosis Date  . CHF (congestive heart failure) ([place])   . Prostate cancer [facility])     Past  Surgical History: Past Surgical History:  Procedure Laterality Date  . CHOLECYSTECTOMY    . PROSTATECTOMY      Current Medications: Current Meds  Medication Sig  . albuterol (VENTOLIN HFA) 108 (90 Base) MCG/ACT inhaler Inhale 2 puffs into the lungs every 4 (four) hours as needed for wheezing or shortness of breath.  . carvedilol (COREG) 12.5 MG tablet Take 1 tablet (12.5 mg total) by mouth 2 (two) times daily with a meal.  . furosemide (LASIX) 20 MG tablet Take 1 tablet (20 mg total) by mouth daily as needed.  . rivaroxaban (XARELTO) 20 MG TABS tablet Take 1 tablet (20 mg total) by mouth daily with supper.  . sacubitril-valsartan (ENTRESTO) 49-51 MG Take 1 tablet by mouth 2 (two) times daily.  . [DISCONTINUED] carvedilol (COREG) 6.25 MG tablet Take 1 tablet (6.25 mg total) by mouth 2 (two) times daily with a meal.     Allergies:    Patient has no known allergies.   Social History: Social History   Socioeconomic History  . Marital status: Single    Spouse name: Not on file  . Number of children: Not on file  . Years of education: Not on file  . Highest education level: Not on file  Occupational History  . Not on file  Tobacco Use  .  Smoking status: Never Smoker  . Smokeless tobacco: Never Used  Vaping Use  . Vaping Use: Never used  Substance and Sexual Activity  . Alcohol use: Yes    Comment: occ  . Drug use: Never  . Sexual activity: Not on file  Other Topics Concern  . Not on file  Social History Narrative  . Not on file   Social Determinants of Health   Financial Resource Strain:   . Difficulty of Paying Living Expenses:   Food Insecurity:   . Worried About Charity fundraiser in the Last Year:   . Arboriculturist in the Last Year:   Transportation Needs:   . Film/video editor (Medical):   Marland Kitchen Lack of Transportation (Non-Medical):   Physical Activity:   . Days of Exercise per Week:   . Minutes of Exercise per Session:   Stress:   . Feeling of Stress :    Social Connections:   . Frequency of Communication with Friends and Family:   . Frequency of Social Gatherings with Friends and Family:   . Attends Religious Services:   . Active Member of Clubs or Organizations:   . Attends Archivist Meetings:   Marland Kitchen Marital Status:      Family History: The patient's family history includes Cancer in his father; Diabetes in his mother; Heart disease in his father; Hypertension in his mother.  ROS:   All other ROS reviewed and negative. Pertinent positives noted in the HPI.     EKGs/Labs/Other Studies Reviewed:   The following studies were personally reviewed by me today:  TTE 08/09/2019 1. Left ventricular ejection fraction, by estimation, is 30 to 35%. The  left ventricle has moderately decreased function. The left ventricle  demonstrates regional wall motion abnormalities with severe septal  hypokinesis and septal-lateral dyssynchrony  consistent with LBBB. There is mild left ventricular hypertrophy. Left  ventricular diastolic parameters are consistent with Grade I diastolic  dysfunction (impaired relaxation).  2. Right ventricular systolic function is normal. The right ventricular  size is normal. Tricuspid regurgitation signal is inadequate for assessing  PA pressure.  3. The mitral valve is normal in structure. No evidence of mitral valve  regurgitation. No evidence of mitral stenosis.  4. The aortic valve is tricuspid. Aortic valve regurgitation is not  visualized. No aortic stenosis is present.  5. The inferior vena cava is normal in size with <50% respiratory  variability, suggesting right atrial pressure of 8 mmHg.   NM Stress 09/06/2019  Nuclear stress EF: 35%.  The left ventricular ejection fraction is moderately decreased (30-44%).  Defect 1: There is a large defect of moderate severity present in the basal anterior, basal anteroseptal, mid anterior, mid anteroseptal, apical anterior, apical septal, apical lateral  and apex location.  Defect 2: There is a medium defect of moderate severity present in the basal inferoseptal, basal inferior, mid inferoseptal, mid inferior and apical inferior location.  Findings consistent with prior myocardial infarction.  This is an intermediate risk study. No ischemia   Fixed perfusion defects in anterior, septal, and inferior walls, consistent with prior infarcts.  No ischemia seen.      Recent Labs: 08/13/2019: Hemoglobin 13.1; Magnesium 1.9; Platelets 339 08/23/2019: ALT 49; BNP 40.6; TSH 0.934 09/07/2019: BUN 21; Creatinine, Ser 1.29; Potassium 4.8; Sodium 140   Recent Lipid Panel    Component Value Date/Time   TRIG 143 08/08/2019 1741    Physical Exam:   VS:  BP 118/70  Pulse 74   Ht 5\' 10"  (1.778 m)   Wt (!) 207 lb (93.9 kg)   SpO2 96%   BMI 29.70 kg/m    Wt Readings from Last 3 Encounters:  11/15/19 (!) 207 lb (93.9 kg)  10/16/19 206 lb 3.2 oz (93.5 kg)  09/07/19 195 lb 6.4 oz (88.6 kg)    General: Well nourished, well developed, in no acute distress Heart: Atraumatic, normal size  Eyes: PEERLA, EOMI  Neck: Supple, no JVD Endocrine: No thryomegaly Cardiac: Normal S1, S2; RRR; no murmurs, rubs, or gallops Lungs: Clear to auscultation bilaterally, no wheezing, rhonchi or rales  Abd: Soft, nontender, no hepatomegaly  Ext: No edema, pulses 2+ Musculoskeletal: No deformities, BUE and BLE strength normal and equal Skin: Warm and dry, no rashes   Neuro: Alert and oriented to person, place, time, and situation, CNII-XII grossly intact, no focal deficits  Psych: Normal mood and affect   ASSESSMENT:   FINIAN HELVEY is a 64 y.o. male who presents for the following: 1. Chronic systolic heart failure ([place])   2. LBBB (left bundle branch block)   3. Mixed hyperlipidemia   4. Deep vein thrombosis (DVT) of proximal lower extremity, unspecified chronicity, unspecified laterality (HCC)     PLAN:   1. Chronic systolic heart failure  (HCC) -Ejection fraction 30-35%.  Diagnosed April 2021 in the setting of Covid pneumonia.  Has had chronic left bundle branch block dating back to 2014 with a QRS of 170 ms.  It appears he is EKG shows similar pattern.  He did undergo nuclear medicine stress test which shows left bundle branch block artifact.  No ischemia or infarction. 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  We will continue with titration of medical therapy.  Increase Coreg to 12.5 mg twice daily.  He will then start Aldactone 12.5 mg daily 1 week after.  His potassium has been acceptable.  I will recheck a BMP today to make sure we are okay here.  He will continue Entresto 49-51 mg twice daily. -I will plan to see him back in October which will be 6 months of guideline directed medical therapy.  We will recheck an echocardiogram prior to seeing [place] in office.  2. LBBB (left bundle branch block) -QRS 170 ms.  Apparently he had a wide QRS prior to LV systolic dysfunction.  He may not benefit from CRT-D.  Nonetheless we will see how he does on medical therapy and check an echocardiogram before his next visit.  3. Mixed hyperlipidemia -Most recent LDL 131.  We will hold on statin at this time.  4. Deep vein thrombosis (DVT) of proximal lower extremity, unspecified chronicity, unspecified laterality ([place]) -Diagnosed in setting of Covid pneumonia in April 2021.  We will plan for 6 months of anticoagulation.  We will stop at her next visit.  Disposition: Return in about 3 months (around 02/15/2020).  Medication Adjustments/Labs and Tests Ordered: Current medicines are reviewed at length with the patient today.  Concerns regarding medicines are outlined above.  Orders Placed This Encounter  Procedures  . ECHOCARDIOGRAM COMPLETE   Meds ordered this encounter  Medications  . carvedilol (COREG) 12.5 MG tablet    Sig: Take 1 tablet (12.5 mg total) by mouth 2 (two) times daily with a meal.    Dispense:  90 tablet     Refill:  1  . spironolactone (ALDACTONE) 25 MG tablet    Sig: Take 0.5 tablets (12.5 mg total) by mouth daily.  Dispense:  90 tablet    Refill:  1    Patient Instructions  Medication Instructions:  Increase Coreg to 12.5 twice daily. Then after 7 days start Aldactone 12.5 mg daily.  *If you need a refill on your cardiac medications before your next appointment, please call your pharmacy*   Lab Work: BMET today  If you have labs (blood work) drawn today and your tests are completely normal, you will receive your results only by: Marland Kitchen MyChart Message (if you have MyChart) OR . A paper copy in the mail If you have any lab test that is abnormal or we need to change your treatment, we will call you to review the results.   Testing/Procedures: Echocardiogram - Your physician has requested that you have an echocardiogram. Echocardiography is a painless test that uses sound waves to create images of your heart. It provides your doctor with information about the size and shape of your heart and how well your heart's chambers and valves are working. This procedure takes approximately one hour. There are no restrictions for this procedure. This will be performed at our [facility] location - [address].    Follow-Up: At [facility], you and your health needs are our priority.  As part of our continuing mission to provide you with exceptional heart care, we have created designated Provider Care Teams.  These Care Teams include your primary Cardiologist (physician) and Advanced Practice Providers (APPs -  Physician Assistants and Nurse Practitioners) who all work together to provide you with the care you need, when you need it.  We recommend signing up for the patient portal called "MyChart".  Sign up information is provided on this After Visit Summary.  MyChart is used to connect with patients for Virtual Visits (Telemedicine).  Patients are able to view lab/test results,  encounter notes, upcoming appointments, etc.  Non-urgent messages can be sent to your provider as well.   To learn more about what you can do with MyChart, go to [email or web address].    Your next appointment:   October 2021  The format for your next appointment:   In Person  Provider:   Eleonore Chiquito, MD       Time Spent with Patient: I have spent a total of 25 minutes with patient reviewing hospital notes, telemetry, EKGs, labs and examining the patient as well as establishing an assessment and plan that was discussed with the patient.  > 50% of time was spent in direct patient care.  Signed, Addison Naegeli. Audie Box, [place]  [address], [place], [place] [address] [phone]  11/15/2019 3:45 PM

## 2019-11-15 ENCOUNTER — Encounter: Payer: Self-pay | Admitting: Cardiovascular Disease

## 2019-11-15 ENCOUNTER — Ambulatory Visit: Payer: Managed Care, Other (non HMO) | Admitting: Cardiovascular Disease

## 2019-11-15 ENCOUNTER — Other Ambulatory Visit: Payer: Self-pay

## 2019-11-15 VITALS — BP 118/70 | HR 74 | Ht 70.0 in | Wt 207.0 lb

## 2019-11-15 DIAGNOSIS — I824Y9 Acute embolism and thrombosis of unspecified deep veins of unspecified proximal lower extremity: Secondary | ICD-10-CM

## 2019-11-15 DIAGNOSIS — E782 Mixed hyperlipidemia: Secondary | ICD-10-CM

## 2019-11-15 DIAGNOSIS — I447 Left bundle-branch block, unspecified: Secondary | ICD-10-CM

## 2019-11-15 DIAGNOSIS — I5022 Chronic systolic (congestive) heart failure: Secondary | ICD-10-CM | POA: Diagnosis not present

## 2019-11-15 MED ORDER — SPIRONOLACTONE 25 MG PO TABS
12.5000 mg | ORAL_TABLET | Freq: Every day | ORAL | 1 refills | Status: DC
Start: 2019-11-15 — End: 2020-08-21

## 2019-11-15 MED ORDER — CARVEDILOL 12.5 MG PO TABS: 12.5000 mg | ORAL_TABLET | Freq: Two times a day (BID) | ORAL | 1 refills | Status: DC

## 2019-11-15 NOTE — Patient Instructions (Addendum)
Medication Instructions:  Increase Coreg to 12.5 twice daily. Then after 7 days start Aldactone 12.5 mg daily.  *If you need a refill on your cardiac medications before your next appointment, please call your pharmacy*   Lab Work: BMET today  If you have labs (blood work) drawn today and your tests are completely normal, you will receive your results only by: Marland Kitchen MyChart Message (if you have MyChart) OR . A paper copy in the mail If you have any lab test that is abnormal or we need to change your treatment, we will call you to review the results.   Testing/Procedures: Echocardiogram - Your physician has requested that you have an echocardiogram. Echocardiography is a painless test that uses sound waves to create images of your heart. It provides your doctor with information about the size and shape of your heart and how well your heart's chambers and valves are working. This procedure takes approximately one hour. There are no restrictions for this procedure. This will be performed at our [facility] location - [address], [address].    Follow-Up: At [facility], you and your health needs are our priority.  As part of our continuing mission to provide you with exceptional heart care, we have created designated Provider Care Teams.  These Care Teams include your primary Cardiologist (physician) and Advanced Practice Providers (APPs -  Physician Assistants and Nurse Practitioners) who all work together to provide you with the care you need, when you need it.  We recommend signing up for the patient portal called "MyChart".  Sign up information is provided on this After Visit Summary.  MyChart is used to connect with patients for Virtual Visits (Telemedicine).  Patients are able to view lab/test results, encounter notes, upcoming appointments, etc.  Non-urgent messages can be sent to your provider as well.   To learn more about what you can do with MyChart, go to [email or web address].     Your next appointment:   October 2021  The format for your next appointment:   In Person  Provider:   Eleonore Chiquito, MD

## 2019-11-16 LAB — BASIC METABOLIC PANEL
BUN/Creatinine Ratio: 20 (ref 10–24)
BUN: 28 mg/dL — ABNORMAL HIGH (ref 8–27)
CO2: 23 mmol/L (ref 20–29)
Calcium: 9.9 mg/dL (ref 8.6–10.2)
Chloride: 109 mmol/L — ABNORMAL HIGH (ref 96–106)
Creatinine, Ser: 1.43 mg/dL — ABNORMAL HIGH (ref 0.76–1.27)
GFR calc Af Amer: 59 mL/min/{1.73_m2} — ABNORMAL LOW (ref >59)
GFR calc non Af Amer: 51 mL/min/{1.73_m2} — ABNORMAL LOW (ref >59)
Glucose: 85 mg/dL (ref 65–99)
Potassium: 4.5 mmol/L (ref 3.5–5.2)
Sodium: 141 mmol/L (ref 134–144)

## 2019-11-27 ENCOUNTER — Other Ambulatory Visit: Payer: Self-pay

## 2019-11-27 ENCOUNTER — Ambulatory Visit (HOSPITAL_COMMUNITY): Payer: Managed Care, Other (non HMO) | Attending: Cardiology

## 2019-11-27 DIAGNOSIS — I5022 Chronic systolic (congestive) heart failure: Secondary | ICD-10-CM | POA: Diagnosis present

## 2019-11-27 LAB — ECHOCARDIOGRAM COMPLETE
Area-P 1/2: 2.71 cm2
S' Lateral: 3.1 cm

## 2019-12-07 ENCOUNTER — Ambulatory Visit (HOSPITAL_BASED_OUTPATIENT_CLINIC_OR_DEPARTMENT_OTHER): Payer: Managed Care, Other (non HMO) | Admitting: Cardiovascular Disease

## 2019-12-12 ENCOUNTER — Ambulatory Visit (HOSPITAL_BASED_OUTPATIENT_CLINIC_OR_DEPARTMENT_OTHER): Payer: Managed Care, Other (non HMO) | Admitting: Cardiovascular Disease

## 2019-12-12 ENCOUNTER — Other Ambulatory Visit: Payer: Self-pay

## 2019-12-19 ENCOUNTER — Ambulatory Visit (HOSPITAL_BASED_OUTPATIENT_CLINIC_OR_DEPARTMENT_OTHER): Payer: Managed Care, Other (non HMO) | Attending: Cardiovascular Disease | Admitting: Cardiovascular Disease

## 2019-12-19 ENCOUNTER — Other Ambulatory Visit: Payer: Self-pay

## 2019-12-26 ENCOUNTER — Other Ambulatory Visit: Payer: Self-pay

## 2019-12-26 DIAGNOSIS — R5382 Chronic fatigue, unspecified: Secondary | ICD-10-CM

## 2020-01-24 ENCOUNTER — Telehealth: Payer: Self-pay | Admitting: *Deleted

## 2020-01-24 NOTE — Telephone Encounter (Signed)
Patient notified of HST. He states that he has had 2 prior HST that were inconclusive. He agrees to attempt it one more time. If it is not successful we will notify Christella Scheuermann and try to get a in lab approval. Patient agrees with plan. [employer] Auth # [id number]. Valid dates 01/24/20 to 04/23/20.

## 2020-02-17 NOTE — Progress Notes (Signed)
Cardiology Office Note:   Date:  02/18/2020  NAME:  Logan Harrison    MRN: [id number] DOB:  1956-01-29   PCP:  [facility]  Cardiologist:  Evalina Field, MD   Referring MD: [facility]*   Chief Complaint  Patient presents with  . Congestive Heart Failure   History of Present Illness:   Logan Harrison is a 64 y.o. male with a hx of covid, CHF with recovery of EF, LBBB, DVT, HLD who presents for follow-up. EF has recovered.  He reports he is doing well.  No chest pain or shortness of breath.  Still walking 4 miles per day on most days.  He is taking Lasix as needed.  Continue to be doing well.  Can get lightheaded and dizzy at times.  Mainly occurs with standing.  BP 94/57 today.  We can reduce his carvedilol.  All other medications are stable.  He reports that he completed a sleep study and is waiting the results.  Apparently there were issues with the machine cutting off.  He is pretty sure that he has sleep apnea but awaiting for final testing.  Overall appears to be doing well.  Problem List 1. Systolic HF -0/3888 -EF 28-00% -> 60-65% (11/27/2019) -NM stress with LBBB artifact 2. LBBB -QRS 160 ms -QRS 170 (2014-2019 per old EKGs) 3. DVT  -2/2 covid PNA (07/2019) 4. Prostate CA 5. HLD -Labs from 2019 from PCP demonstrate total cholesterol 205, triglycerides 139, HDL 46, LDL 131  Past Medical History: Past Medical History:  Diagnosis Date  . CHF (congestive heart failure) ([place])   . Prostate cancer [facility])     Past Surgical History: Past Surgical History:  Procedure Laterality Date  . CHOLECYSTECTOMY    . PROSTATECTOMY      Current Medications: Current Meds  Medication Sig  . albuterol (VENTOLIN HFA) 108 (90 Base) MCG/ACT inhaler Inhale 2 puffs into the lungs every 4 (four) hours as needed for wheezing or shortness of breath.  . sacubitril-valsartan (ENTRESTO) 49-51 MG Take 1 tablet by mouth 2 (two) times daily.  Marland Kitchen spironolactone  (ALDACTONE) 25 MG tablet Take 0.5 tablets (12.5 mg total) by mouth daily.  . [DISCONTINUED] carvedilol (COREG) 12.5 MG tablet Take 1 tablet (12.5 mg total) by mouth 2 (two) times daily with a meal.  . [DISCONTINUED] rivaroxaban (XARELTO) 20 MG TABS tablet Take 1 tablet (20 mg total) by mouth daily with supper.     Allergies:    Patient has no known allergies.   Social History: Social History   Socioeconomic History  . Marital status: Single    Spouse name: Not on file  . Number of children: Not on file  . Years of education: Not on file  . Highest education level: Not on file  Occupational History  . Not on file  Tobacco Use  . Smoking status: Never Smoker  . Smokeless tobacco: Never Used  Vaping Use  . Vaping Use: Never used  Substance and Sexual Activity  . Alcohol use: Yes    Comment: occ  . Drug use: Never  . Sexual activity: Not on file  Other Topics Concern  . Not on file  Social History Narrative  . Not on file   Social Determinants of Health   Financial Resource Strain:   . Difficulty of Paying Living Expenses: Not on file  Food Insecurity:   . Worried About Charity fundraiser in the Last Year: Not on file  .  Ran Out of Food in the Last Year: Not on file  Transportation Needs:   . Lack of Transportation (Medical): Not on file  . Lack of Transportation (Non-Medical): Not on file  Physical Activity:   . Days of Exercise per Week: Not on file  . Minutes of Exercise per Session: Not on file  Stress:   . Feeling of Stress : Not on file  Social Connections:   . Frequency of Communication with Friends and Family: Not on file  . Frequency of Social Gatherings with Friends and Family: Not on file  . Attends Religious Services: Not on file  . Active Member of Clubs or Organizations: Not on file  . Attends Archivist Meetings: Not on file  . Marital Status: Not on file     Family History: The patient's family history includes Cancer in his father;  Diabetes in his mother; Heart disease in his father; Hypertension in his mother.  ROS:   All other ROS reviewed and negative. Pertinent positives noted in the HPI.     EKGs/Labs/Other Studies Reviewed:   The following studies were personally reviewed by me today:   TTE 11/27/2019 1. Left ventricular ejection fraction, by estimation, is 60 to 65%. The  left ventricle has normal function. The left ventricle has no regional  wall motion abnormalities. There is moderate concentric left ventricular  hypertrophy. Left ventricular  diastolic parameters are consistent with Grade I diastolic dysfunction  (impaired relaxation).  2. Right ventricular systolic function is normal. The right ventricular  size is normal.  3. The mitral valve is normal in structure. No evidence of mitral valve  regurgitation. No evidence of mitral stenosis.  4. The aortic valve is normal in structure. Aortic valve regurgitation is  not visualized. No aortic stenosis is present.  5. The inferior vena cava is normal in size with greater than 50%  respiratory variability, suggesting right atrial pressure of 3 mmHg.  NM Stress 09/06/2019  Nuclear stress EF: 35%.  The left ventricular ejection fraction is moderately decreased (30-44%).  Defect 1: There is a large defect of moderate severity present in the basal anterior, basal anteroseptal, mid anterior, mid anteroseptal, apical anterior, apical septal, apical lateral and apex location.  Defect 2: There is a medium defect of moderate severity present in the basal inferoseptal, basal inferior, mid inferoseptal, mid inferior and apical inferior location.  Findings consistent with prior myocardial infarction.  This is an intermediate risk study. No ischemia   Fixed perfusion defects in anterior, septal, and inferior walls, consistent with prior infarcts.  No ischemia seen.   Recent Labs: 08/13/2019: Hemoglobin 13.1; Magnesium 1.9; Platelets 339 08/23/2019: ALT  49; BNP 40.6; TSH 0.934 11/15/2019: BUN 28; Creatinine, Ser 1.43; Potassium 4.5; Sodium 141   Recent Lipid Panel    Component Value Date/Time   TRIG 143 08/08/2019 1741    Physical Exam:   VS:  BP (!) 94/57   Pulse 62   Ht 5\' 10"  (1.778 m)   Wt 215 lb 9.6 oz (97.8 kg)   SpO2 99%   BMI 30.94 kg/m    Wt Readings from Last 3 Encounters:  02/18/20 215 lb 9.6 oz (97.8 kg)  12/19/19 207 lb (93.9 kg)  12/12/19 205 lb (93 kg)    General: Well nourished, well developed, in no acute distress Heart: Atraumatic, normal size  Eyes: PEERLA, EOMI  Neck: Supple, no JVD Endocrine: No thryomegaly Cardiac: Normal S1, S2; RRR; no murmurs, rubs, or gallops Lungs: Clear  to auscultation bilaterally, no wheezing, rhonchi or rales  Abd: Soft, nontender, no hepatomegaly  Ext: No edema, pulses 2+ Musculoskeletal: No deformities, BUE and BLE strength normal and equal Skin: Warm and dry, no rashes   Neuro: Alert and oriented to person, place, time, and situation, CNII-XII grossly intact, no focal deficits  Psych: Normal mood and affect   ASSESSMENT:   WOODARD PERRELL is a 64 y.o. male who presents for the following: 1. Chronic systolic heart failure ([place])   2. LBBB (left bundle branch block)   3. Mixed hyperlipidemia   4. Deep vein thrombosis (DVT) of proximal lower extremity, unspecified chronicity, unspecified laterality (HCC)     PLAN:   1. Chronic systolic heart failure ([place]) 2. LBBB (left bundle branch block) -Diagnosed with systolic heart failure in April 2021.  This was in the setting of COVID-19 pneumonia.  Started on medical therapy.  EF has recovered.  He will continue his carvedilol, Entresto and Aldactone.  He does report some lightheadedness and dizziness we will reduce the Coreg to 6.25 mg twice daily.  He remains on Entresto 49-51.  He is on Aldactone 12.5 mg daily. -He completed a nuclear medicine stress test.  Had left bundle branch artifact.  No evidence of ischemia or  infarction. -EF has recovered.  This is a nonischemic retinopathy.  Would recommend to continue indefinite medical therapy.  He takes Lasix as needed.  3. Mixed hyperlipidemia -Continue diet and exercise for now.  4. Deep vein thrombosis (DVT) of proximal lower extremity, unspecified chronicity, unspecified laterality ([place]) -Had a DVT in the setting of COVID-19 pneumonia.  This occurred in April 2021.  He is completed 6 months of therapy.  We will stop his Xarelto.  Disposition: Return in about 6 months (around 08/17/2020).  Medication Adjustments/Labs and Tests Ordered: Current medicines are reviewed at length with the patient today.  Concerns regarding medicines are outlined above.  No orders of the defined types were placed in this encounter.  Meds ordered this encounter  Medications  . carvedilol (COREG) 6.25 MG tablet    Sig: Take 1 tablet (6.25 mg total) by mouth 2 (two) times daily.    Dispense:  180 tablet    Refill:  3    Patient Instructions  Medication Instructions:  Decrease Carvedilol to 6.25 mg twice a day  Stop taking Xarelto Continue all othe rmedications *If you need a refill on your cardiac medications before your next appointment, please call your pharmacy*   Lab Work: None ordered   Testing/Procedures: None ordered   Follow-Up: At [facility], you and your health needs are our priority.  As part of our continuing mission to provide you with exceptional heart care, we have created designated Provider Care Teams.  These Care Teams include your primary Cardiologist (physician) and Advanced Practice Providers (APPs -  Physician Assistants and Nurse Practitioners) who all work together to provide you with the care you need, when you need it.  We recommend signing up for the patient portal called "MyChart".  Sign up information is provided on this After Visit Summary.  MyChart is used to connect with patients for Virtual Visits (Telemedicine).  Patients are  able to view lab/test results, encounter notes, upcoming appointments, etc.  Non-urgent messages can be sent to your provider as well.   To learn more about what you can do with MyChart, go to [email or web address].    Your next appointment:  6 months    Call in March to schedule  May appointment    The format for your next appointment: Office    Provider:  Dr.O'Neal      Time Spent with Patient: I have spent a total of 35 minutes with patient reviewing hospital notes, telemetry, EKGs, labs and examining the patient as well as establishing an assessment and plan that was discussed with the patient.  > 50% of time was spent in direct patient care.  Signed, Addison Naegeli. Audie Box, [place]  [address], [place], [place] [address] [phone]  02/18/2020 2:55 PM

## 2020-02-18 ENCOUNTER — Ambulatory Visit (INDEPENDENT_AMBULATORY_CARE_PROVIDER_SITE_OTHER): Payer: Managed Care, Other (non HMO) | Admitting: Cardiovascular Disease

## 2020-02-18 ENCOUNTER — Other Ambulatory Visit: Payer: Self-pay

## 2020-02-18 ENCOUNTER — Encounter: Payer: Self-pay | Admitting: Cardiovascular Disease

## 2020-02-18 VITALS — BP 94/57 | HR 62 | Ht 70.0 in | Wt 215.6 lb

## 2020-02-18 DIAGNOSIS — I5022 Chronic systolic (congestive) heart failure: Secondary | ICD-10-CM | POA: Diagnosis not present

## 2020-02-18 DIAGNOSIS — I447 Left bundle-branch block, unspecified: Secondary | ICD-10-CM | POA: Diagnosis not present

## 2020-02-18 DIAGNOSIS — E782 Mixed hyperlipidemia: Secondary | ICD-10-CM | POA: Diagnosis not present

## 2020-02-18 DIAGNOSIS — I824Y9 Acute embolism and thrombosis of unspecified deep veins of unspecified proximal lower extremity: Secondary | ICD-10-CM | POA: Diagnosis not present

## 2020-02-18 MED ORDER — CARVEDILOL 6.25 MG PO TABS: 6.2500 mg | ORAL_TABLET | Freq: Two times a day (BID) | ORAL | 3 refills | Status: DC

## 2020-02-18 NOTE — Patient Instructions (Signed)
Medication Instructions:  Decrease Carvedilol to 6.25 mg twice a day  Stop taking Xarelto Continue all othe rmedications *If you need a refill on your cardiac medications before your next appointment, please call your pharmacy*   Lab Work: None ordered   Testing/Procedures: None ordered   Follow-Up: At [facility], you and your health needs are our priority.  As part of our continuing mission to provide you with exceptional heart care, we have created designated Provider Care Teams.  These Care Teams include your primary Cardiologist (physician) and Advanced Practice Providers (APPs -  Physician Assistants and Nurse Practitioners) who all work together to provide you with the care you need, when you need it.  We recommend signing up for the patient portal called "MyChart".  Sign up information is provided on this After Visit Summary.  MyChart is used to connect with patients for Virtual Visits (Telemedicine).  Patients are able to view lab/test results, encounter notes, upcoming appointments, etc.  Non-urgent messages can be sent to your provider as well.   To learn more about what you can do with MyChart, go to [email or web address].    Your next appointment:  6 months    Call in March to schedule May appointment    The format for your next appointment: Office    Provider:  Dr.O'Neal

## 2020-02-29 ENCOUNTER — Other Ambulatory Visit: Payer: Self-pay

## 2020-02-29 ENCOUNTER — Ambulatory Visit (HOSPITAL_BASED_OUTPATIENT_CLINIC_OR_DEPARTMENT_OTHER): Payer: Managed Care, Other (non HMO) | Attending: Cardiovascular Disease | Admitting: Cardiovascular Disease

## 2020-02-29 DIAGNOSIS — G4733 Obstructive sleep apnea (adult) (pediatric): Secondary | ICD-10-CM | POA: Diagnosis not present

## 2020-02-29 DIAGNOSIS — R0683 Snoring: Secondary | ICD-10-CM | POA: Diagnosis not present

## 2020-02-29 DIAGNOSIS — G4719 Other hypersomnia: Secondary | ICD-10-CM | POA: Diagnosis not present

## 2020-02-29 DIAGNOSIS — G4736 Sleep related hypoventilation in conditions classified elsewhere: Secondary | ICD-10-CM | POA: Insufficient documentation

## 2020-02-29 DIAGNOSIS — Z79899 Other long term (current) drug therapy: Secondary | ICD-10-CM | POA: Insufficient documentation

## 2020-02-29 DIAGNOSIS — R5382 Chronic fatigue, unspecified: Secondary | ICD-10-CM

## 2020-03-15 ENCOUNTER — Encounter (HOSPITAL_BASED_OUTPATIENT_CLINIC_OR_DEPARTMENT_OTHER): Payer: Self-pay | Admitting: Cardiovascular Disease

## 2020-03-15 NOTE — Procedures (Signed)
    Patient Name: Logan Harrison, Logan Harrison Date: 02/29/2020 Gender: Male D.O.B: 1955/04/27 Age (years): 64 Referring Provider: Cassie Freer ONeal Height (inches): 31 Interpreting Physician: Shelva Majestic MD, ABSM Weight (lbs): 205 RPSGT: Jacolyn Reedy BMI: 29 MRN: [id number] Neck Size: 17.00  CLINICAL INFORMATION Sleep Study Type: HST  Indication for sleep study: snoring, daytime sleepiness,   Epworth Sleepiness Score: 13  SLEEP STUDY TECHNIQUE A multi-channel overnight portable sleep study was performed. The channels recorded were: nasal airflow, thoracic respiratory movement, and oxygen saturation with a pulse oximetry. Snoring was also monitored.  MEDICATIONS albuterol (VENTOLIN HFA) 108 (90 Base) MCG/ACT inhaler carvedilol (COREG) 6.25 MG tablet furosemide (LASIX) 20 MG tablet(Expired) sacubitril-valsartan (ENTRESTO) 49-51 MG spironolactone (ALDACTONE) 25 MG tablet Patient self administered medications include: N/A.  SLEEP ARCHITECTURE Patient was studied for 401.7 minutes. The sleep efficiency was 100.0 % and the patient was supine for 45.7%. The arousal index was 0.0 per hour.  RESPIRATORY PARAMETERS The overall AHI was 18.1 per hour, with a central apnea index of 0.0 per hour. There was a significant positional component with severe sleep apnea during supine sleep (AHI 36.0/h) versus non-supine sleep (AHI 3.02/h). The severity during REM sleep cannot be assessed on this home study.  The oxygen nadir was 84% during sleep.  CARDIAC DATA Mean heart rate during sleep was 66.1 bpm.  IMPRESSIONS - Moderate obstructive sleep apnea occurred during this study (AHI 18.1/h). - No significant central sleep apnea occurred during this study (CAI 0.0/h). - Moderate oxygen desaturation to a nadir of 84%. - Patient snored 12.0% during the sleep.  DIAGNOSIS - Obstructive Sleep Apnea (G47.33) - Nocturnal Hypoxemia (G47.36)  RECOMMENDATIONS - In this patient with  cardiovascular comorbidities recommend an in-lab CPAP titration study to further evaluate and treat his sleep disordered breathing. - Effort should be made to optimize nasal and oropharyngeal patency. - The patient should be counseled to avoid supoine sleep; consider positional therapy. - Avoid alcohol, sedatives and other CNS depressants that may worsen sleep apnea and disrupt normal sleep architecture. - Sleep hygiene should be reviewed to assess factors that may improve sleep quality. - Weight management and regular exercise should be initiated or continued. - Recommend a download and sleep clinic evaluation after initiation of CPAP therapy.   [Electronically signed] 03/15/2020 05:13 PM  Shelva Majestic MD, [facility], [place], American Board of Sleep Medicine   NPI: [id number]  [place] PH: [phone]   FX: [phone] [place]

## 2020-03-16 ENCOUNTER — Telehealth: Payer: Self-pay | Admitting: *Deleted

## 2020-03-16 DIAGNOSIS — G4733 Obstructive sleep apnea (adult) (pediatric): Secondary | ICD-10-CM

## 2020-03-16 NOTE — Telephone Encounter (Signed)
-----   Message from Troy Sine, MD sent at 03/15/2020  5:19 PM EST ----- Wanda/Nina  Please notify pt of the results and schedule CPAP titration study.

## 2020-03-16 NOTE — Telephone Encounter (Addendum)
Informed patient of sleep study results and patient understanding was verbalized. Patient understands her sleep study showed IMPRESSIONS - Moderate obstructive sleep apnea occurred during this study (AHI 18.1/h). - Moderate oxygen desaturation to a nadir of 84%. - Patient snored 12.0% during the sleep.  DIAGNOSIS - Obstructive Sleep Apnea (G47.33) - Nocturnal Hypoxemia (G47.36)  RECOMMENDATIONS - In this patient with cardiovascular comorbidities recommend an in-lab CPAP titration study to further evaluate and treat his sleep disordered breathing.  Pt is aware and agreeable to his results cpap titration sent to sleep pool.

## 2020-03-25 ENCOUNTER — Other Ambulatory Visit: Payer: Self-pay

## 2020-03-25 MED ORDER — ENTRESTO 49-51 MG PO TABS: 1.0000 | ORAL_TABLET | Freq: Two times a day (BID) | ORAL | 3 refills | Status: DC

## 2020-04-29 ENCOUNTER — Telehealth: Payer: Self-pay | Admitting: Cardiovascular Disease

## 2020-04-29 NOTE — Telephone Encounter (Signed)
Logan Harrison has been denied twice and so has the appeal, titration deemed not necessary. Any advice on where to go from here?

## 2020-05-02 NOTE — Telephone Encounter (Signed)
Sleep studies and Dr. Claiborne Billings currently responsible for follow up. Will copy RN.

## 2020-05-05 NOTE — Telephone Encounter (Signed)
Staff message sent to  Dr Claiborne Billings for APAP orders. Orders per Dr Claiborne Billings Autopap 7-18 CnH2O EPR-3.

## 2020-06-21 ENCOUNTER — Observation Stay (HOSPITAL_BASED_OUTPATIENT_CLINIC_OR_DEPARTMENT_OTHER)
Admission: EM | Admit: 2020-06-21 | Discharge: 2020-06-22 | Disposition: A | Discharge status: Home / Self Care | Payer: Managed Care, Other (non HMO) | Attending: Internal Medicine | Admitting: Internal Medicine

## 2020-06-21 ENCOUNTER — Emergency Department (HOSPITAL_BASED_OUTPATIENT_CLINIC_OR_DEPARTMENT_OTHER): Payer: Managed Care, Other (non HMO)

## 2020-06-21 ENCOUNTER — Observation Stay (HOSPITAL_COMMUNITY): Payer: Managed Care, Other (non HMO)

## 2020-06-21 ENCOUNTER — Other Ambulatory Visit: Payer: Self-pay

## 2020-06-21 ENCOUNTER — Encounter (HOSPITAL_BASED_OUTPATIENT_CLINIC_OR_DEPARTMENT_OTHER): Payer: Self-pay | Admitting: Emergency Medicine

## 2020-06-21 DIAGNOSIS — G453 Amaurosis fugax: Secondary | ICD-10-CM

## 2020-06-21 DIAGNOSIS — Z85820 Personal history of malignant melanoma of skin: Secondary | ICD-10-CM | POA: Insufficient documentation

## 2020-06-21 DIAGNOSIS — Z8546 Personal history of malignant neoplasm of prostate: Secondary | ICD-10-CM | POA: Diagnosis not present

## 2020-06-21 DIAGNOSIS — N1831 Chronic kidney disease, stage 3a: Secondary | ICD-10-CM | POA: Diagnosis not present

## 2020-06-21 DIAGNOSIS — H538 Other visual disturbances: Secondary | ICD-10-CM | POA: Diagnosis present

## 2020-06-21 DIAGNOSIS — Z20822 Contact with and (suspected) exposure to covid-19: Secondary | ICD-10-CM | POA: Insufficient documentation

## 2020-06-21 DIAGNOSIS — Z79899 Other long term (current) drug therapy: Secondary | ICD-10-CM | POA: Insufficient documentation

## 2020-06-21 DIAGNOSIS — H53122 Transient visual loss, left eye: Principal | ICD-10-CM | POA: Insufficient documentation

## 2020-06-21 DIAGNOSIS — I5032 Chronic diastolic (congestive) heart failure: Secondary | ICD-10-CM | POA: Insufficient documentation

## 2020-06-21 DIAGNOSIS — I639 Cerebral infarction, unspecified: Secondary | ICD-10-CM | POA: Insufficient documentation

## 2020-06-21 HISTORY — DX: Malignant melanoma of skin, unspecified: C43.9

## 2020-06-21 LAB — COMPREHENSIVE METABOLIC PANEL
ALT: 19 U/L (ref 0–44)
AST: 25 U/L (ref 15–41)
Albumin: 3.7 g/dL (ref 3.5–5.0)
Alkaline Phosphatase: 49 U/L (ref 38–126)
Anion gap: 9 (ref 5–15)
BUN: 23 mg/dL (ref 8–23)
CO2: 24 mmol/L (ref 22–32)
Calcium: 9 mg/dL (ref 8.9–10.3)
Chloride: 106 mmol/L (ref 98–111)
Creatinine, Ser: 1.69 mg/dL — ABNORMAL HIGH (ref 0.61–1.24)
GFR, Estimated: 45 mL/min — ABNORMAL LOW (ref >60)
Glucose, Bld: 95 mg/dL (ref 70–99)
Potassium: 4.1 mmol/L (ref 3.5–5.1)
Sodium: 139 mmol/L (ref 135–145)
Total Bilirubin: 0.8 mg/dL (ref 0.3–1.2)
Total Protein: 6 g/dL — ABNORMAL LOW (ref 6.5–8.1)

## 2020-06-21 LAB — PROTIME-INR
INR: 1 (ref 0.8–1.2)
Prothrombin Time: 12.3 seconds (ref 11.4–15.2)

## 2020-06-21 LAB — URINALYSIS, ROUTINE W REFLEX MICROSCOPIC
Bilirubin Urine: NEGATIVE
Glucose, UA: NEGATIVE mg/dL
Hgb urine dipstick: NEGATIVE
Ketones, ur: NEGATIVE mg/dL
Leukocytes,Ua: NEGATIVE
Nitrite: NEGATIVE
Protein, ur: NEGATIVE mg/dL
Specific Gravity, Urine: 1.015 (ref 1.005–1.030)
pH: 7.5 (ref 5.0–8.0)

## 2020-06-21 LAB — DIFFERENTIAL
Abs Immature Granulocytes: 0.03 10*3/uL (ref 0.00–0.07)
Basophils Absolute: 0.1 10*3/uL (ref 0.0–0.1)
Basophils Relative: 1 %
Eosinophils Absolute: 0.4 10*3/uL (ref 0.0–0.5)
Eosinophils Relative: 6 %
Immature Granulocytes: 1 %
Lymphocytes Relative: 29 %
Lymphs Abs: 1.9 10*3/uL (ref 0.7–4.0)
Monocytes Absolute: 0.7 10*3/uL (ref 0.1–1.0)
Monocytes Relative: 11 %
Neutro Abs: 3.4 10*3/uL (ref 1.7–7.7)
Neutrophils Relative %: 52 %

## 2020-06-21 LAB — APTT: aPTT: 28 seconds (ref 24–36)

## 2020-06-21 LAB — RAPID URINE DRUG SCREEN, HOSP PERFORMED
Amphetamines: NOT DETECTED
Barbiturates: NOT DETECTED
Benzodiazepines: NOT DETECTED
Cocaine: NOT DETECTED
Opiates: NOT DETECTED
Tetrahydrocannabinol: NOT DETECTED

## 2020-06-21 LAB — CBC
HCT: 42.4 % (ref 39.0–52.0)
Hemoglobin: 14.4 g/dL (ref 13.0–17.0)
MCH: 32.2 pg (ref 26.0–34.0)
MCHC: 34 g/dL (ref 30.0–36.0)
MCV: 94.9 fL (ref 80.0–100.0)
Platelets: 213 10*3/uL (ref 150–400)
RBC: 4.47 MIL/uL (ref 4.22–5.81)
RDW: 11.5 % (ref 11.5–15.5)
WBC: 6.5 10*3/uL (ref 4.0–10.5)
nRBC: 0 % (ref 0.0–0.2)

## 2020-06-21 LAB — ETHANOL: Alcohol, Ethyl (B): <10 mg/dL (ref <10)

## 2020-06-21 LAB — SARS CORONAVIRUS 2 BY RT PCR (HOSPITAL ORDER, PERFORMED IN ~~LOC~~ HOSPITAL LAB): SARS Coronavirus 2: NEGATIVE

## 2020-06-21 IMAGING — CT CT ANGIO HEAD
1 of 10 series · 2 of 33 positions shown · IV contrast (Omnipaque)
Comparison: None.

CLINICAL DATA: Abnormal left vision

EXAM:
CT ANGIOGRAPHY HEAD AND NECK
TECHNIQUE: Multidetector CT imaging of the head and neck was performed using
the standard protocol during bolus administration of intravenous
contrast. Multiplanar CT image reconstructions and MIPs were
obtained to evaluate the vascular anatomy. Carotid stenosis
measurements (when applicable) are obtained utilizing NASCET
criteria, using the distal internal carotid diameter as the
denominator.
CONTRAST:  75mL OMNIPAQUE IOHEXOL 350 MG/ML SOLN

[Series 512: axial thin · axial · 0.51mm/px · z∈[-268,-153]mm · 2 of 353 slices shown]
[im 118/353  soft-tissue]
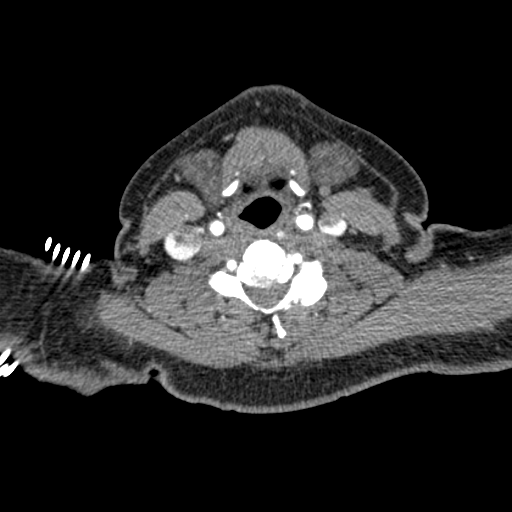
[im 235/353  bone]
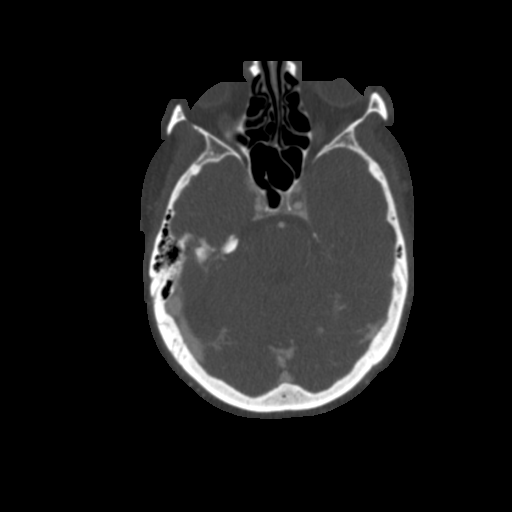

[2 of 33 positions shown; findings below may reference images not displayed]

FINDINGS: CT HEAD

Brain: There is no acute intracranial hemorrhage, mass effect, or
edema. Gray-white differentiation is preserved. There is no
extra-axial fluid collection. Ventricles and sulci are within normal
limits in size and configuration.

Vascular: No hyperdense vessel or unexpected calcification.

Skull: Calvarium is unremarkable.

Sinuses/Orbits: No acute finding.

Other: None.

Review of the MIP images confirms the above findings

CTA NECK

Aortic arch: Great vessel origins patent.

Right carotid system: Patent. No stenosis. No evidence of
dissection.

Left carotid system: Patent. No stenosis. No evidence of dissection

Vertebral arteries: Patent and codominant. No stenosis. No evidence
of dissection

Skeleton: Degenerative changes of the cervical spine.

Other neck: No significant abnormality.

Upper chest: No apical lung mass.

Review of the MIP images confirms the above findings

CTA HEAD

Anterior circulation: Intracranial internal carotid arteries patent.
Anterior cerebral arteries are patent. Anterior communicating artery
is present. Middle cerebral arteries are patent.

Posterior circulation: Intracranial vertebral arteries and basilar
artery are patent. Major cerebellar artery branch origins are
patent. Posterior cerebral arteries are patent.

Venous sinuses: Patent as allowed by contrast bolus timing.

Review of the MIP images confirms the above findings
IMPRESSION: No acute intracranial hemorrhage or evidence of acute infarction.

No large vessel occlusion, hemodynamically significant stenosis, or
evidence of dissection.

## 2020-06-21 IMAGING — CT CT ANGIO NECK
1 of 11 series · 5 of 33 positions shown · IV contrast (Omnipaque)
Comparison: None.

CLINICAL DATA: Abnormal left vision

EXAM:
CT ANGIOGRAPHY HEAD AND NECK
TECHNIQUE: Multidetector CT imaging of the head and neck was performed using
the standard protocol during bolus administration of intravenous
contrast. Multiplanar CT image reconstructions and MIPs were
obtained to evaluate the vascular anatomy. Carotid stenosis
measurements (when applicable) are obtained utilizing NASCET
criteria, using the distal internal carotid diameter as the
denominator.
CONTRAST:  75mL OMNIPAQUE IOHEXOL 350 MG/ML SOLN

[Series 13: axial thin · axial · 0.51mm/px · z∈[-326,-95]mm · 5 of 353 slices shown]
[im 59/353  soft-tissue]
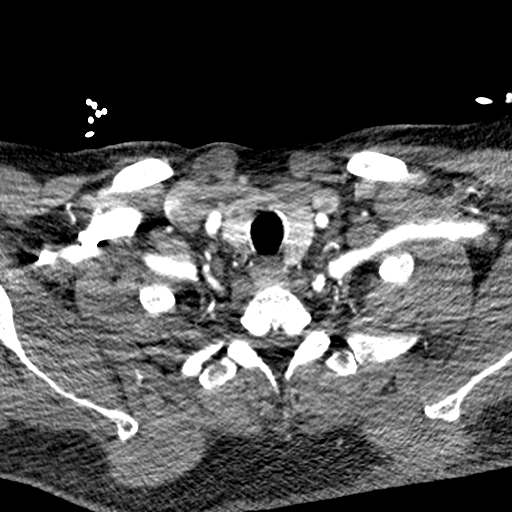
[im 118/353  bone]
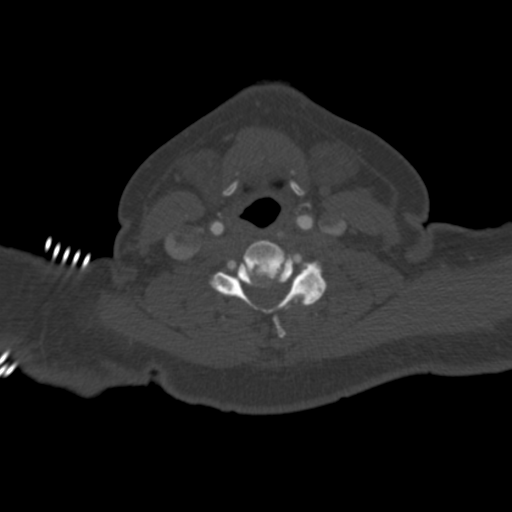
[im 177/353  soft-tissue]
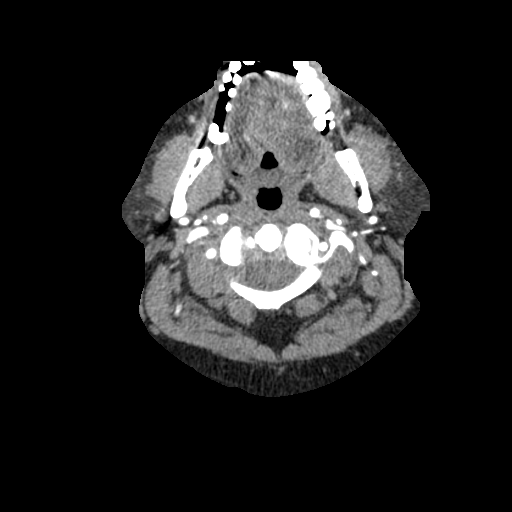
[im 235/353  bone]
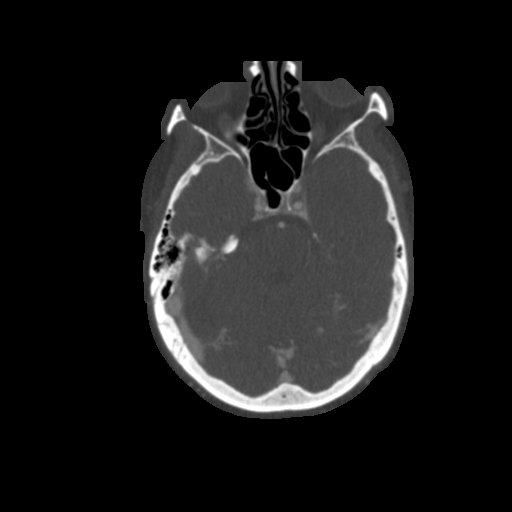
[im 294/353  soft-tissue]
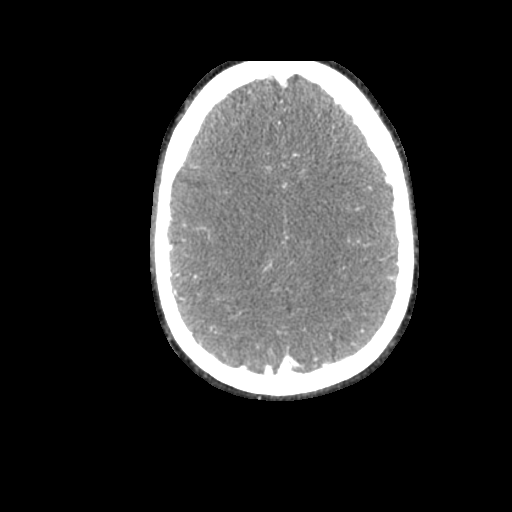

[5 of 33 positions shown; findings below may reference images not displayed]

FINDINGS: CT HEAD

Brain: There is no acute intracranial hemorrhage, mass effect, or
edema. Gray-white differentiation is preserved. There is no
extra-axial fluid collection. Ventricles and sulci are within normal
limits in size and configuration.

Vascular: No hyperdense vessel or unexpected calcification.

Skull: Calvarium is unremarkable.

Sinuses/Orbits: No acute finding.

Other: None.

Review of the MIP images confirms the above findings

CTA NECK

Aortic arch: Great vessel origins patent.

Right carotid system: Patent. No stenosis. No evidence of
dissection.

Left carotid system: Patent. No stenosis. No evidence of dissection

Vertebral arteries: Patent and codominant. No stenosis. No evidence
of dissection

Skeleton: Degenerative changes of the cervical spine.

Other neck: No significant abnormality.

Upper chest: No apical lung mass.

Review of the MIP images confirms the above findings

CTA HEAD

Anterior circulation: Intracranial internal carotid arteries patent.
Anterior cerebral arteries are patent. Anterior communicating artery
is present. Middle cerebral arteries are patent.

Posterior circulation: Intracranial vertebral arteries and basilar
artery are patent. Major cerebellar artery branch origins are
patent. Posterior cerebral arteries are patent.

Venous sinuses: Patent as allowed by contrast bolus timing.

Review of the MIP images confirms the above findings
IMPRESSION: No acute intracranial hemorrhage or evidence of acute infarction.

No large vessel occlusion, hemodynamically significant stenosis, or
evidence of dissection.

## 2020-06-21 IMAGING — MR MR HEAD W/O CM
12 of 13 series · 44 of 48 positions shown · non-contrast
Comparison: Prior CT from earlier the same day.

CLINICAL DATA: Initial evaluation for acute TIA, decreased
left-sided vision.

EXAM:
MRI HEAD WITHOUT CONTRAST
TECHNIQUE: Multiplanar, multiecho pulse sequences of the brain and surrounding
structures were obtained without intravenous contrast.

[Series 5: DWI · axial · 3.0mm · 0.88mm/px · z∈[-51,+96]mm · 8 of 100 slices shown (1 of 4)]
[im 1/100]
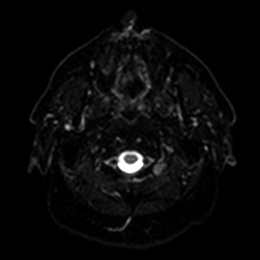
[im 15/100]
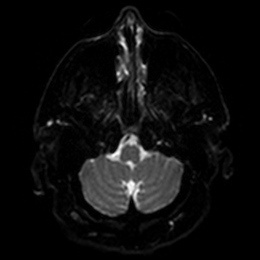
[im 29/100]
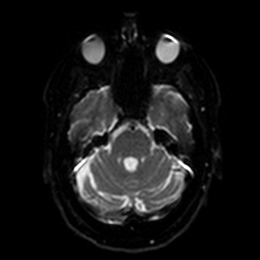
[im 43/100]
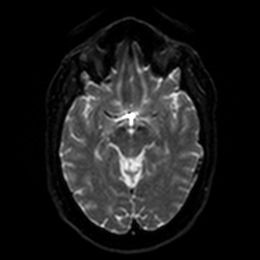
[im 57/100]
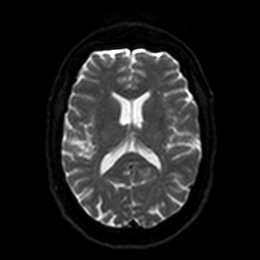
[im 71/100]
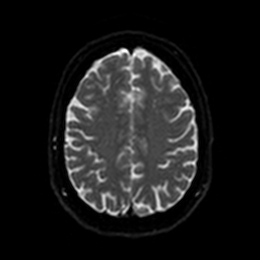
[im 85/100]
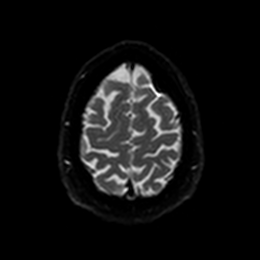
[im 100/100]
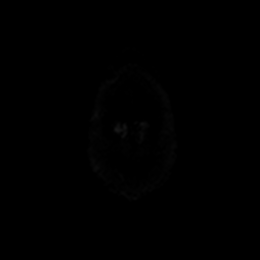

[Series 6: DWI · axial · 3.0mm · 0.88mm/px · z∈[-51,+96]mm · 4 of 50 slices shown (2 of 4)]
[im 1/50]
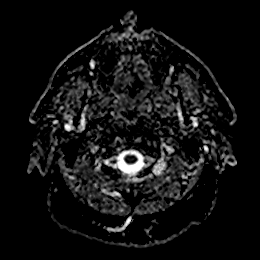
[im 17/50]
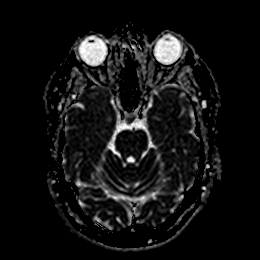
[im 33/50]
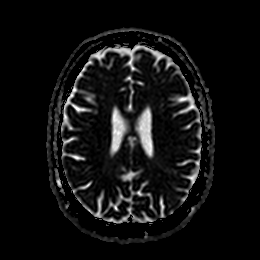
[im 50/50]
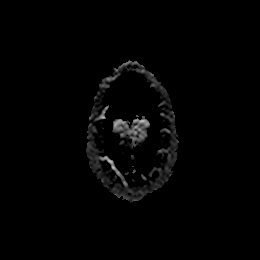

[Series 7: DWI · coronal · 4.0mm · 0.88mm/px · 5 of 68 slices shown (3 of 4)]
[im 1/68]
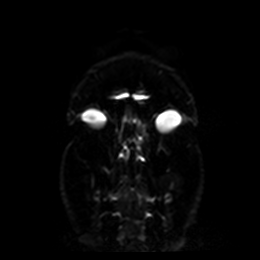
[im 17/68]
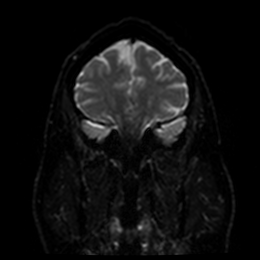
[im 34/68]
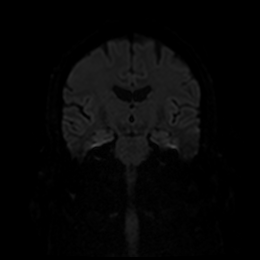
[im 51/68]
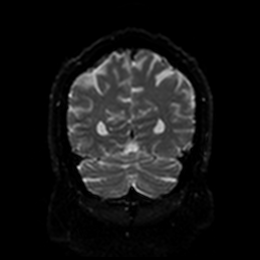
[im 68/68]
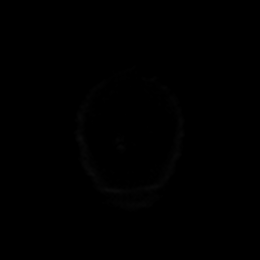

[Series 8: DWI · coronal · 4.0mm · 0.88mm/px · 3 of 34 slices shown (4 of 4)]
[im 1/34]
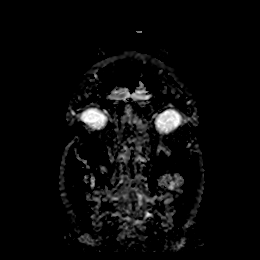
[im 17/34]
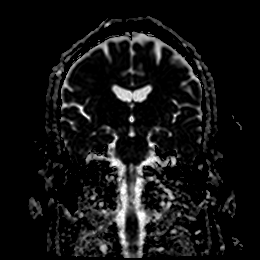
[im 34/34]
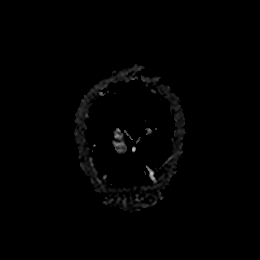

[Series 9: T1 · sagittal · 5.0mm · 0.75mm/px · 2 of 25 slices shown]
[im 1/25]
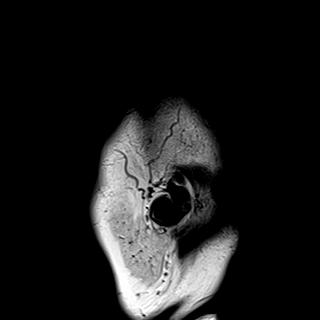
[im 25/25]
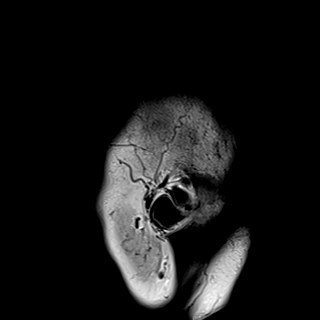

[Series 10: T2 · axial · 5.0mm · 0.72mm/px · z∈[-61,+95]mm · 2 of 27 slices shown (1 of 2)]
[im 1/27]
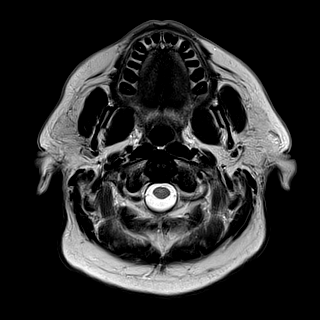
[im 27/27]
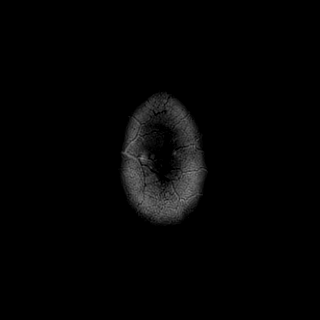

[Series 11: FLAIR · axial · 5.0mm · 0.90mm/px · z∈[-61,+95]mm · 2 of 27 slices shown]
[im 1/27]
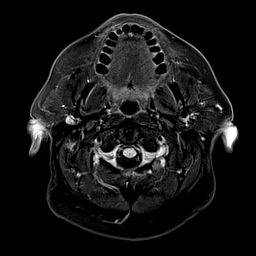
[im 27/27]
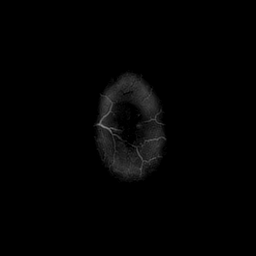

[Series 12: mag_images · axial · 3.0mm · 0.90mm/px · z∈[-66,+99]mm · 4 of 56 slices shown]
[im 1/56]
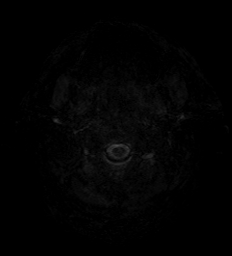
[im 19/56]
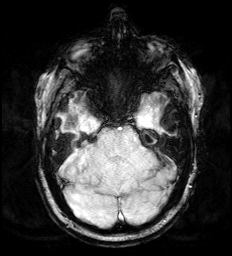
[im 37/56]
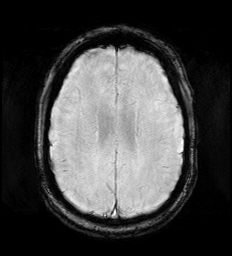
[im 56/56]
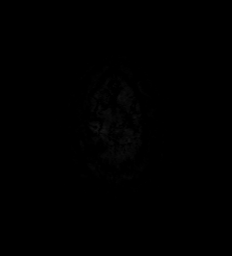

[Series 13: pha_images · axial · 3.0mm · 0.90mm/px · z∈[-66,+99]mm · 4 of 56 slices shown]
[im 1/56]
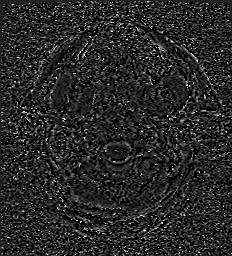
[im 19/56]
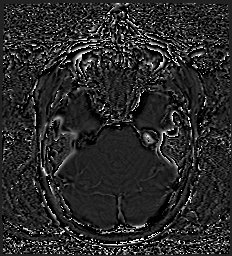
[im 37/56]
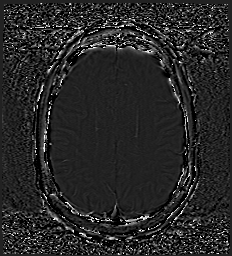
[im 56/56]
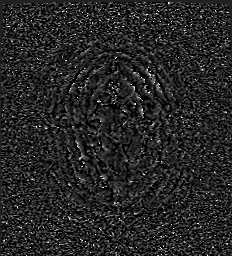

[Series 14: swi_images · axial · 3.0mm · 0.90mm/px · z∈[-66,+99]mm · 4 of 56 slices shown]
[im 1/56]
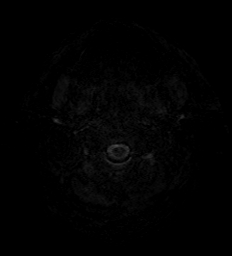
[im 19/56]
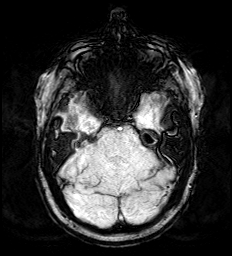
[im 37/56]
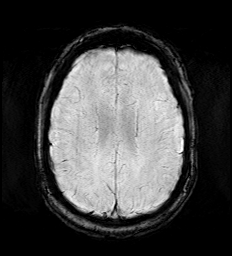
[im 56/56]
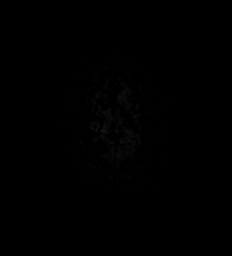

[Series 15: mip_images(sw) · axial · 24.0mm · 0.90mm/px · z∈[-55,+89]mm · 4 of 49 slices shown]
[im 1/49]
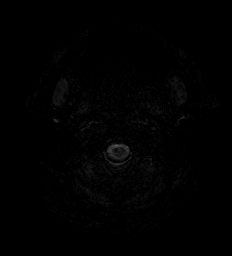
[im 17/49]
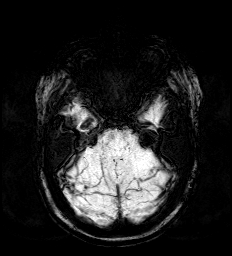
[im 33/49]
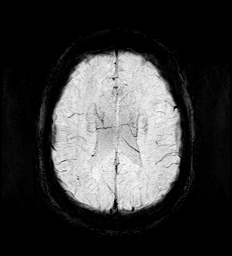
[im 49/49]
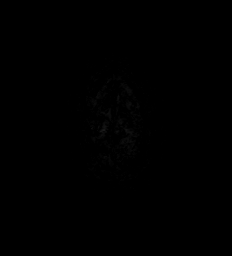

[Series 17: T2 · coronal · 5.0mm · 0.72mm/px · 2 of 32 slices shown (2 of 2)]
[im 1/32]
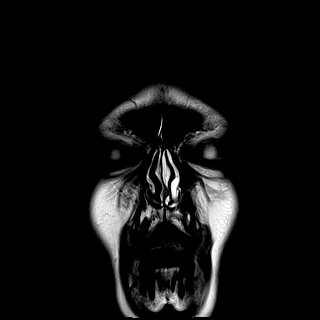
[im 32/32]
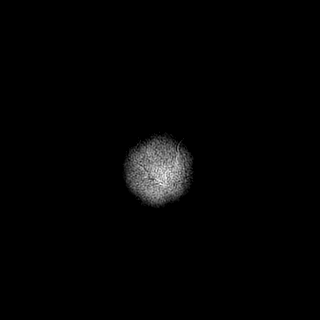

[44 of 48 positions shown; findings below may reference images not displayed]

FINDINGS: Brain: Cerebral volume within normal limits. No significant cerebral
white matter disease for age. No abnormal foci of restricted
diffusion to suggest acute or subacute ischemia. Gray-white matter
differentiation maintained. No encephalomalacia to suggest chronic
cortical infarction. No evidence for acute or chronic intracranial
hemorrhage.

No mass lesion, midline shift or mass effect. No hydrocephalus or
extra-axial fluid collection. Pituitary gland suprasellar region
within normal limits. Midline structures intact and normal.

Vascular: Major intracranial vascular flow voids are well
maintained.

Skull and upper cervical spine: Craniocervical junction normal. Bone
marrow signal intensity within normal limits. No scalp soft tissue
abnormality.

Sinuses/Orbits: Globes and orbital soft tissues demonstrate no acute
finding. Small air-fluid level noted within the right sphenoid
sinus. Additional scattered mucosal thickening noted within the
ethmoidal air cells. No mastoid effusion. Inner ear structures
grossly normal.

Other: None.
IMPRESSION: 1. Normal brain MRI for age. No acute intracranial abnormality
identified.
2. Mild sphenoethmoidal sinus disease.

## 2020-06-21 MED ORDER — ENOXAPARIN SODIUM 40 MG/0.4ML ~~LOC~~ SOLN
40.0000 mg | SUBCUTANEOUS | Status: DC
  Administered 2020-06-21: 40 mg via SUBCUTANEOUS
  Filled 2020-06-21: qty 0.4

## 2020-06-21 MED ORDER — IOHEXOL 350 MG/ML SOLN
100.0000 mL | Freq: Once | INTRAVENOUS | Status: AC | PRN
  Administered 2020-06-21: 75 mL via INTRAVENOUS

## 2020-06-21 MED ORDER — ACETAMINOPHEN 650 MG RE SUPP: 650.0000 mg | RECTAL | Status: DC | PRN

## 2020-06-21 MED ORDER — TETRACAINE HCL 0.5 % OP SOLN
1.0000 [drp] | Freq: Once | OPHTHALMIC | Status: AC
  Administered 2020-06-21: 1 [drp] via OPHTHALMIC
  Filled 2020-06-21: qty 4

## 2020-06-21 MED ORDER — ALBUTEROL SULFATE (2.5 MG/3ML) 0.083% IN NEBU: 2.5000 mg | INHALATION_SOLUTION | RESPIRATORY_TRACT | Status: DC | PRN

## 2020-06-21 MED ORDER — SENNOSIDES-DOCUSATE SODIUM 8.6-50 MG PO TABS: 1.0000 | ORAL_TABLET | Freq: Every evening | ORAL | Status: DC | PRN

## 2020-06-21 MED ORDER — ACETAMINOPHEN 160 MG/5ML PO SOLN: 650.0000 mg | ORAL | Status: DC | PRN

## 2020-06-21 MED ORDER — STROKE: EARLY STAGES OF RECOVERY BOOK
Freq: Once | Status: AC
  Filled 2020-06-21: qty 1

## 2020-06-21 MED ORDER — ACETAMINOPHEN 325 MG PO TABS
650.0000 mg | ORAL_TABLET | ORAL | Status: DC | PRN
  Administered 2020-06-21: 650 mg via ORAL
  Filled 2020-06-21: qty 2

## 2020-06-21 MED ORDER — SODIUM CHLORIDE 0.9 % IV SOLN: INTRAVENOUS | Status: AC

## 2020-06-21 NOTE — ED Triage Notes (Signed)
Pt via pov from home with eye pain, blurred vision (left eye), and floaters this morning since 0900. LKW [id number]. Pt denies any pain or weakness to either side of his body or dizziness, n/v. Pt alert & oriented, nad noted.

## 2020-06-21 NOTE — ED Provider Notes (Signed)
[place] EMERGENCY DEPARTMENT Provider Note   CSN: [id number] Arrival date & time: 06/21/20  1126     History Chief Complaint  Patient presents with  . Blurred Vision  . Eye Pain    Logan Harrison Logan Harrison is a 65 y.o. male.  HPI Patient presents with decreased vision in left eye.  States started around 9:00 this morning.  States initially he would not be able to see his hand in front of his left eye but now much better.  States he still feels little foggy.  States there was some floaters initially.  No headache.  May have some mild pressure in the left eye but no frank pain.  No other numbness or weakness.  Has not had episodes like this before.    Past Medical History:  Diagnosis Date  . CHF (congestive heart failure) ([place])   . Melanoma ([place])   . Prostate cancer [facility])     Patient Active Problem List   Diagnosis Date Noted  . Pneumonia due to COVID-19 virus 08/09/2019  . Bilateral pulmonary embolism ([place]) 08/09/2019  . Acute respiratory failure due to COVID-19 ([place]) 08/09/2019  . Transaminitis 08/09/2019  . Serum total bilirubin elevated 08/09/2019  . Acute pulmonary embolus ([place]) 08/08/2019    Past Surgical History:  Procedure Laterality Date  . APPENDECTOMY    . CHOLECYSTECTOMY    . PROSTATECTOMY         Family History  Problem Relation Age of Onset  . Diabetes Mother   . Hypertension Mother   . Cancer Father   . Heart disease Father     Social History   Tobacco Use  . Smoking status: Never Smoker  . Smokeless tobacco: Never Used  Vaping Use  . Vaping Use: Never used  Substance Use Topics  . Alcohol use: Yes    Comment: occ  . Drug use: Never    Home Medications Prior to Admission medications   Medication Sig Start Date End Date Taking? Authorizing Provider  albuterol (VENTOLIN HFA) 108 (90 Base) MCG/ACT inhaler Inhale 2 puffs into the lungs every 4 (four) hours as needed for wheezing or shortness of breath. 08/13/19  Yes Thurnell Lose, MD  carvedilol (COREG) 6.25 MG tablet Take 1 tablet (6.25 mg total) by mouth 2 (two) times daily. 02/18/20  Yes O'Neal, Cassie Freer, MD  furosemide (LASIX) 20 MG tablet Take 1 tablet (20 mg total) by mouth daily as needed. 10/16/19 01/14/20 Yes O'Neal, Cassie Freer, MD  sacubitril-valsartan (ENTRESTO) 49-51 MG Take 1 tablet by mouth 2 (two) times daily. 03/25/20  Yes O'Neal, Cassie Freer, MD  spironolactone (ALDACTONE) 25 MG tablet Take 0.5 tablets (12.5 mg total) by mouth daily. 11/15/19  Yes O'Neal, Cassie Freer, MD    Allergies    Patient has no known allergies.  Review of Systems   Review of Systems  Constitutional: Negative for appetite change.  HENT: Negative for congestion.   Eyes: Positive for visual disturbance. Negative for discharge, redness and itching.  Respiratory: Negative for shortness of breath.   Cardiovascular: Negative for chest pain.  Gastrointestinal: Negative for abdominal pain.  Musculoskeletal: Negative for back pain.  Skin: Negative for rash.  Neurological: Negative for weakness.  Psychiatric/Behavioral: Negative for confusion.    Physical Exam Updated Vital Signs BP 123/83   Pulse 61   Temp 98.2 F (36.8 C) (Oral)   Resp 14   Ht 5\' 10"  (1.778 m)   Wt 103.8 kg   SpO2 98%  BMI 32.83 kg/m   Physical Exam Vitals and nursing note reviewed.  HENT:     Head: Normocephalic.  Eyes:     Extraocular Movements: Extraocular movements intact.     Pupils: Pupils are equal, round, and reactive to light.     Comments: Intraocular pressure of 15 on left.  Visual fields grossly intact by confrontation.  Cardiovascular:     Rate and Rhythm: Normal rate and regular rhythm.     Heart sounds: Normal heart sounds.  Abdominal:     Tenderness: There is no abdominal tenderness.  Musculoskeletal:        General: No tenderness.  Skin:    General: Skin is warm.     Capillary Refill: Capillary refill takes less than 2 seconds.  Neurological:     Mental Status:  He is alert and oriented to person, place, and time.     ED Results / Procedures / Treatments   Labs (all labs ordered are listed, but only abnormal results are displayed) Labs Reviewed  COMPREHENSIVE METABOLIC PANEL - Abnormal; Notable for the following components:      Result Value   Creatinine, Ser 1.69 (*)    Total Protein 6.0 (*)    GFR, Estimated 45 (*)    All other components within normal limits  URINALYSIS, ROUTINE W REFLEX MICROSCOPIC - Abnormal; Notable for the following components:   Color, Urine STRAW (*)    All other components within normal limits  SARS CORONAVIRUS 2 (HOSPITAL ORDER, [facility])  CBC  ETHANOL  PROTIME-INR  APTT  DIFFERENTIAL  RAPID URINE DRUG SCREEN, HOSP PERFORMED    EKG EKG Interpretation  Date/Time:  Saturday June 21 2020 13:11:25 EST Ventricular Rate:  52 PR Interval:    QRS Duration: 158 QT Interval:  464 QTC Calculation: 432 R Axis:   -4 Text Interpretation: Sinus rhythm Left bundle branch block rate decreased from prior Confirmed by Davonna Belling [phone]) on 06/21/2020 1:48:18 PM   Radiology CT Angio Head W or Wo Contrast  Result Date: 06/21/2020 CLINICAL DATA:  Abnormal left vision EXAM: CT ANGIOGRAPHY HEAD AND NECK TECHNIQUE: Multidetector CT imaging of the head and neck was performed using the standard protocol during bolus administration of intravenous contrast. Multiplanar CT image reconstructions and MIPs were obtained to evaluate the vascular anatomy. Carotid stenosis measurements (when applicable) are obtained utilizing NASCET criteria, using the distal internal carotid diameter as the denominator. CONTRAST:  70mL OMNIPAQUE IOHEXOL 350 MG/ML SOLN COMPARISON:  None. FINDINGS: CT HEAD Brain: There is no acute intracranial hemorrhage, mass effect, or edema. Gray-white differentiation is preserved. There is no extra-axial fluid collection. Ventricles and sulci are within normal limits in size and  configuration. Vascular: No hyperdense vessel or unexpected calcification. Skull: Calvarium is unremarkable. Sinuses/Orbits: No acute finding. Other: None. Review of the MIP images confirms the above findings CTA NECK Aortic arch: Great vessel origins patent. Right carotid system: Patent. No stenosis. No evidence of dissection. Left carotid system: Patent. No stenosis. No evidence of dissection Vertebral arteries: Patent and codominant. No stenosis. No evidence of dissection Skeleton: Degenerative changes of the cervical spine. Other neck: No significant abnormality. Upper chest: No apical lung mass. Review of the MIP images confirms the above findings CTA HEAD Anterior circulation: Intracranial internal carotid arteries patent. Anterior cerebral arteries are patent. Anterior communicating artery is present. Middle cerebral arteries are patent. Posterior circulation: Intracranial vertebral arteries and basilar artery are patent. Major cerebellar artery branch origins are patent. Posterior  cerebral arteries are patent. Venous sinuses: Patent as allowed by contrast bolus timing. Review of the MIP images confirms the above findings IMPRESSION: No acute intracranial hemorrhage or evidence of acute infarction. No large vessel occlusion, hemodynamically significant stenosis, or evidence of dissection. Electronically Signed   By: Macy Mis M.D.   On: 06/21/2020 14:43   CT Angio Neck W and/or Wo Contrast  Result Date: 06/21/2020 CLINICAL DATA:  Abnormal left vision EXAM: CT ANGIOGRAPHY HEAD AND NECK TECHNIQUE: Multidetector CT imaging of the head and neck was performed using the standard protocol during bolus administration of intravenous contrast. Multiplanar CT image reconstructions and MIPs were obtained to evaluate the vascular anatomy. Carotid stenosis measurements (when applicable) are obtained utilizing NASCET criteria, using the distal internal carotid diameter as the denominator. CONTRAST:  78mL OMNIPAQUE  IOHEXOL 350 MG/ML SOLN COMPARISON:  None. FINDINGS: CT HEAD Brain: There is no acute intracranial hemorrhage, mass effect, or edema. Gray-white differentiation is preserved. There is no extra-axial fluid collection. Ventricles and sulci are within normal limits in size and configuration. Vascular: No hyperdense vessel or unexpected calcification. Skull: Calvarium is unremarkable. Sinuses/Orbits: No acute finding. Other: None. Review of the MIP images confirms the above findings CTA NECK Aortic arch: Great vessel origins patent. Right carotid system: Patent. No stenosis. No evidence of dissection. Left carotid system: Patent. No stenosis. No evidence of dissection Vertebral arteries: Patent and codominant. No stenosis. No evidence of dissection Skeleton: Degenerative changes of the cervical spine. Other neck: No significant abnormality. Upper chest: No apical lung mass. Review of the MIP images confirms the above findings CTA HEAD Anterior circulation: Intracranial internal carotid arteries patent. Anterior cerebral arteries are patent. Anterior communicating artery is present. Middle cerebral arteries are patent. Posterior circulation: Intracranial vertebral arteries and basilar artery are patent. Major cerebellar artery branch origins are patent. Posterior cerebral arteries are patent. Venous sinuses: Patent as allowed by contrast bolus timing. Review of the MIP images confirms the above findings IMPRESSION: No acute intracranial hemorrhage or evidence of acute infarction. No large vessel occlusion, hemodynamically significant stenosis, or evidence of dissection. Electronically Signed   By: Macy Mis M.D.   On: 06/21/2020 14:43    Procedures Procedures   Medications Ordered in ED Medications  tetracaine (PONTOCAINE) 0.5 % ophthalmic solution 1 drop (1 drop Left Eye Given by Other 06/21/20 1245)  iohexol (OMNIPAQUE) 350 MG/ML injection 100 mL (75 mLs Intravenous Contrast Given 06/21/20 1404)    ED  Course  I have reviewed the triage vital signs and the nursing notes.  Pertinent labs & imaging results that were available during my care of the patient were reviewed by me and considered in my medical decision making (see chart for details).    MDM Rules/Calculators/A&P                         Patient presents with vision loss.  Began acutely at around 9:00 this morning.  Has improved since but still mild blurring of the vision.  Initially sounds of it was somewhat severe where he had difficulty seeing anything in front of him.  May have some mild pressure in the eye but good intraocular pressure.  No headaches.  Discussed with Dr. Zadie Rhine from ophthalmology.  Does not need acute ophthalmologic work-up at this time but needs more of the work-up for amaurosis fugax.  This has been done and so far reassuring with the CTA here, however after discussion with neurology patient's ABCD 2  score is 4 which would require admission to the hospital.  Will admit to [facility].  Final Clinical Impression(s) / ED Diagnoses Final diagnoses:  Amaurosis fugax of left eye    Rx / DC Orders ED Discharge Orders    None       Davonna Belling, MD 06/21/20 1506

## 2020-06-21 NOTE — ED Notes (Signed)
Pt on phone with family during transfer; pt given card with room number and phone number for unit; pt declined this RN calling anyone

## 2020-06-21 NOTE — ED Notes (Signed)
Report given to [facility] 

## 2020-06-21 NOTE — ED Notes (Signed)
Pt at CT at this time.

## 2020-06-21 NOTE — ED Notes (Signed)
Pt states vision has improved since arrival, still mildly blurry but improving. Given graham crackers and drink at this time. Awaiting transfer to [facility]

## 2020-06-21 NOTE — H&P (Signed)
History and Physical    PETE SCHNITZER NWG:[id number] DOB: 1955-06-05 DOA: 06/21/2020  PCP: [facility]   Patient coming from: Home   Chief Complaint: Vision problem   HPI: Logan Harrison is a 65 y.o. male with medical history significant for diastolic CHF and [id number]-96 infection 1 year ago that was complicated by acute PE, now presenting to the emergency department with a vision disturbance.  Patient reports that he went to bed in his usual state of health last night and shortly after waking this morning noticed trouble seeing out of his left eye.  He describes his left eye vision as if there was a green film over his eye, states that he initially was unable to see his hand held in front of his left eye, but then began to experience some improvement.  The right eye was not affected and the entire left visual field was involved.  He denies any associated headache and denies any numbness or weakness.  He had never experienced this previously.  [facility] ED Course: Upon arrival to the ED, patient is found to be afebrile, saturating well on room air, and with stable blood pressure.  EKG features sinus rhythm with chronic LBBB.  Head CT is negative for acute intracranial hemorrhage or acute infarction and CTA head and neck is negative for LVO, hemodynamically significant stenosis, or dissection.  Chemistry panel features a creatinine 1.69.  CBC is unremarkable.  COVID-19 PCR is negative.  ED physician discussed case with ophthalmology did not feel that ophthalmologic was necessary but a TIA/CVA work-up was recommended.  ED discussed the case with neurology who recommended admission to the hospitalist service.  Patient was transferred to [facility].   Review of Systems:  All other systems reviewed and apart from HPI, are negative.  Past Medical History:  Diagnosis Date  . CHF (congestive heart failure) ([place])   . Melanoma ([place])   . Prostate cancer [facility])      Past Surgical History:  Procedure Laterality Date  . APPENDECTOMY    . CHOLECYSTECTOMY    . PROSTATECTOMY      Social History:   reports that he has never smoked. He has never used smokeless tobacco. He reports current alcohol use. He reports that he does not use drugs.  No Known Allergies  Family History  Problem Relation Age of Onset  . Diabetes Mother   . Hypertension Mother   . Cancer Father   . Heart disease Father      Prior to Admission medications   Medication Sig Start Date End Date Taking? Authorizing Provider  albuterol (VENTOLIN HFA) 108 (90 Base) MCG/ACT inhaler Inhale 2 puffs into the lungs every 4 (four) hours as needed for wheezing or shortness of breath. 08/13/19  Yes Thurnell Lose, MD  carvedilol (COREG) 6.25 MG tablet Take 1 tablet (6.25 mg total) by mouth 2 (two) times daily. 02/18/20  Yes O'Neal, Cassie Freer, MD  furosemide (LASIX) 20 MG tablet Take 1 tablet (20 mg total) by mouth daily as needed. 10/16/19 01/14/20 Yes O'Neal, Cassie Freer, MD  sacubitril-valsartan (ENTRESTO) 49-51 MG Take 1 tablet by mouth 2 (two) times daily. 03/25/20  Yes O'Neal, Cassie Freer, MD  spironolactone (ALDACTONE) 25 MG tablet Take 0.5 tablets (12.5 mg total) by mouth daily. 11/15/19  Yes Geralynn Rile, MD    Physical Exam: Vitals:   06/21/20 1600 06/21/20 1700 06/21/20 1900 06/21/20 2057  BP: 132/75 122/80 135/81 (!) 156/87  Pulse: (!) 58 (!) 57 (!) 58 64  Resp: 14 16 14 16   Temp:    97.8 F (36.6 C)  TempSrc:    Oral  SpO2: 99% 100% 98%   Weight:    101.5 kg  Height:    5\' 10"  (1.778 m)    Constitutional: NAD, calm  Eyes: PERTLA, lids and conjunctivae normal ENMT: Mucous membranes are moist. Posterior pharynx clear of any exudate or lesions.   Neck: normal, supple, no masses, no thyromegaly Respiratory:  no wheezing, no crackles. No accessory muscle use.  Cardiovascular: S1 & S2 heard, regular rate and rhythm. No extremity edema.   Abdomen: No  distension, no tenderness, soft. Bowel sounds active.  Musculoskeletal: no clubbing / cyanosis. No joint deformity upper and lower extremities.   Skin: no significant rashes, lesions, ulcers. Warm, dry, well-perfused. Neurologic: CN 2-12 grossly intact. Sensation intact, DTR normal. Strength 5/5 in all 4 limbs.  Psychiatric: Alert and oriented to person, place, and situation. Pleasant and cooperative.    Labs and Imaging on Admission: I have personally reviewed following labs and imaging studies  CBC: Recent Labs  Lab 06/21/20 1304  WBC 6.5  NEUTROABS 3.4  HGB 14.4  HCT 42.4  MCV 94.9  PLT 301   Basic Metabolic Panel: Recent Labs  Lab 06/21/20 1304  NA 139  K 4.1  CL 106  CO2 24  GLUCOSE 95  BUN 23  CREATININE 1.69*  CALCIUM 9.0   GFR: Estimated Creatinine Clearance: 52.7 mL/min (A) (by C-G formula based on SCr of 1.69 mg/dL (H)). Liver Function Tests: Recent Labs  Lab 06/21/20 1304  AST 25  ALT 19  ALKPHOS 49  BILITOT 0.8  PROT 6.0*  ALBUMIN 3.7   No results for input(s): LIPASE, AMYLASE in the last 168 hours. No results for input(s): AMMONIA in the last 168 hours. Coagulation Profile: Recent Labs  Lab 06/21/20 1304  INR 1.0   Cardiac Enzymes: No results for input(s): CKTOTAL, CKMB, CKMBINDEX, TROPONINI in the last 168 hours. BNP (last 3 results) No results for input(s): PROBNP in the last 8760 hours. HbA1C: No results for input(s): HGBA1C in the last 72 hours. CBG: No results for input(s): GLUCAP in the last 168 hours. Lipid Profile: No results for input(s): CHOL, HDL, LDLCALC, TRIG, CHOLHDL, LDLDIRECT in the last 72 hours. Thyroid Function Tests: No results for input(s): TSH, T4TOTAL, FREET4, T3FREE, THYROIDAB in the last 72 hours. Anemia Panel: No results for input(s): VITAMINB12, FOLATE, FERRITIN, TIBC, IRON, RETICCTPCT in the last 72 hours. Urine analysis:    Component Value Date/Time   COLORURINE STRAW (A) 06/21/2020 1315   APPEARANCEUR  CLEAR 06/21/2020 1315   LABSPEC 1.015 06/21/2020 1315   PHURINE 7.5 06/21/2020 1315   GLUCOSEU NEGATIVE 06/21/2020 1315   HGBUR NEGATIVE 06/21/2020 1315   BILIRUBINUR NEGATIVE 06/21/2020 1315   KETONESUR NEGATIVE 06/21/2020 1315   PROTEINUR NEGATIVE 06/21/2020 1315   NITRITE NEGATIVE 06/21/2020 1315   LEUKOCYTESUR NEGATIVE 06/21/2020 1315   Sepsis Labs: @LABRCNTIP (procalcitonin:4,lacticidven:4) ) Recent Results (from the past 240 hour(s))  SARS Coronavirus 2 by RT PCR (hospital order, performed in [place] hospital lab)     Status: None   Collection Time: 06/21/20  1:04 PM  Result Value Ref Range Status   SARS Coronavirus 2 NEGATIVE NEGATIVE Final    Comment: (NOTE) SARS-CoV-2 target nucleic acids are NOT DETECTED.  The SARS-CoV-2 RNA is generally detectable in upper and lower respiratory specimens during the acute phase of infection. The lowest  concentration of SARS-CoV-2 viral copies this assay can detect is 250 copies / mL. A negative result does not preclude SARS-CoV-2 infection and should not be used as the sole basis for treatment or other patient management decisions.  A negative result may occur with improper specimen collection / handling, submission of specimen other than nasopharyngeal swab, presence of viral mutation(s) within the areas targeted by this assay, and inadequate number of viral copies (<250 copies / mL). A negative result must be combined with clinical observations, patient history, and epidemiological information.  Fact Sheet for Patients:   StrictlyIdeas.no  Fact Sheet for Healthcare Providers: BankingDealers.co.za  This test is not yet approved or  cleared by the [place] FDA and has been authorized for detection and/or diagnosis of SARS-CoV-2 by FDA under an Emergency Use Authorization (EUA).  This EUA will remain in effect (meaning this test can be used) for the duration of the COVID-19  declaration under Section 564(b)(1) of the Act, 21 U.S.C. section 360bbb-3(b)(1), unless the authorization is terminated or revoked sooner.  Performed at [facility], [address] [place], [place] [address]      Radiological Exams on Admission: CT Angio Head W or Wo Contrast  Result Date: 06/21/2020 CLINICAL DATA:  Abnormal left vision EXAM: CT ANGIOGRAPHY HEAD AND NECK TECHNIQUE: Multidetector CT imaging of the head and neck was performed using the standard protocol during bolus administration of intravenous contrast. Multiplanar CT image reconstructions and MIPs were obtained to evaluate the vascular anatomy. Carotid stenosis measurements (when applicable) are obtained utilizing NASCET criteria, using the distal internal carotid diameter as the denominator. CONTRAST:  75mL OMNIPAQUE IOHEXOL 350 MG/ML SOLN COMPARISON:  None. FINDINGS: CT HEAD Brain: There is no acute intracranial hemorrhage, mass effect, or edema. Gray-white differentiation is preserved. There is no extra-axial fluid collection. Ventricles and sulci are within normal limits in size and configuration. Vascular: No hyperdense vessel or unexpected calcification. Skull: Calvarium is unremarkable. Sinuses/Orbits: No acute finding. Other: None. Review of the MIP images confirms the above findings CTA NECK Aortic arch: Great vessel origins patent. Right carotid system: Patent. No stenosis. No evidence of dissection. Left carotid system: Patent. No stenosis. No evidence of dissection Vertebral arteries: Patent and codominant. No stenosis. No evidence of dissection Skeleton: Degenerative changes of the cervical spine. Other neck: No significant abnormality. Upper chest: No apical lung mass. Review of the MIP images confirms the above findings CTA HEAD Anterior circulation: Intracranial internal carotid arteries patent. Anterior cerebral arteries are patent. Anterior communicating artery is present. Middle cerebral arteries are  patent. Posterior circulation: Intracranial vertebral arteries and basilar artery are patent. Major cerebellar artery branch origins are patent. Posterior cerebral arteries are patent. Venous sinuses: Patent as allowed by contrast bolus timing. Review of the MIP images confirms the above findings IMPRESSION: No acute intracranial hemorrhage or evidence of acute infarction. No large vessel occlusion, hemodynamically significant stenosis, or evidence of dissection. Electronically Signed   By: Macy Mis M.D.   On: 06/21/2020 14:43   CT Angio Neck W and/or Wo Contrast  Result Date: 06/21/2020 CLINICAL DATA:  Abnormal left vision EXAM: CT ANGIOGRAPHY HEAD AND NECK TECHNIQUE: Multidetector CT imaging of the head and neck was performed using the standard protocol during bolus administration of intravenous contrast. Multiplanar CT image reconstructions and MIPs were obtained to evaluate the vascular anatomy. Carotid stenosis measurements (when applicable) are obtained utilizing NASCET criteria, using the distal internal carotid diameter as the denominator. CONTRAST:  49mL OMNIPAQUE IOHEXOL  350 MG/ML SOLN COMPARISON:  None. FINDINGS: CT HEAD Brain: There is no acute intracranial hemorrhage, mass effect, or edema. Gray-white differentiation is preserved. There is no extra-axial fluid collection. Ventricles and sulci are within normal limits in size and configuration. Vascular: No hyperdense vessel or unexpected calcification. Skull: Calvarium is unremarkable. Sinuses/Orbits: No acute finding. Other: None. Review of the MIP images confirms the above findings CTA NECK Aortic arch: Great vessel origins patent. Right carotid system: Patent. No stenosis. No evidence of dissection. Left carotid system: Patent. No stenosis. No evidence of dissection Vertebral arteries: Patent and codominant. No stenosis. No evidence of dissection Skeleton: Degenerative changes of the cervical spine. Other neck: No significant abnormality.  Upper chest: No apical lung mass. Review of the MIP images confirms the above findings CTA HEAD Anterior circulation: Intracranial internal carotid arteries patent. Anterior cerebral arteries are patent. Anterior communicating artery is present. Middle cerebral arteries are patent. Posterior circulation: Intracranial vertebral arteries and basilar artery are patent. Major cerebellar artery branch origins are patent. Posterior cerebral arteries are patent. Venous sinuses: Patent as allowed by contrast bolus timing. Review of the MIP images confirms the above findings IMPRESSION: No acute intracranial hemorrhage or evidence of acute infarction. No large vessel occlusion, hemodynamically significant stenosis, or evidence of dissection. Electronically Signed   By: Macy Mis M.D.   On: 06/21/2020 14:43    EKG: Independently reviewed. Sinus rhythm, chronic LBBB.   Assessment/Plan  1. Transient vision loss, left  - Patient noticed painless left eye vision loss shortly after waking this am, had no acute findings on CTA head and neck at [facility], and reports that vision is now close to baseline  - Neurology consulting and much appreciated  - Continue cardiac monitoring and neuro checks, check MRI brain, echocardiogram, fasting lipids, and A1c   2. CKD IIIa  - SCr is 1.69 on admission   - Baseline appears closer to 1.3 or 1.4, will start gentle IVF hydration, renally-dose medications, repeat chem panel in am   3. Chronic diastolic CHF  - Appears compensated  - Monitor volume-status, echo is ordered as above     DVT prophylaxis: Lovenox  Code Status: Full  Level of Care: Level of care: Telemetry Medical Family Communication: Significant other updated at bedside  Disposition Plan:  Patient is from: home  Anticipated d/c is to: Home  Anticipated d/c date is: Possibly as early as 06/22/20 Patient currently: Pending neurology consultation, MRI brain, echo   Consults called: Neurology  Admission  status: Observation     Vianne Bulls, MD [facility]  06/21/2020, 10:15 PM

## 2020-06-22 ENCOUNTER — Observation Stay (HOSPITAL_BASED_OUTPATIENT_CLINIC_OR_DEPARTMENT_OTHER): Payer: Managed Care, Other (non HMO)

## 2020-06-22 DIAGNOSIS — H53122 Transient visual loss, left eye: Secondary | ICD-10-CM | POA: Diagnosis not present

## 2020-06-22 DIAGNOSIS — I6389 Other cerebral infarction: Secondary | ICD-10-CM | POA: Diagnosis not present

## 2020-06-22 LAB — ECHOCARDIOGRAM COMPLETE
AR max vel: 2.05 cm2
AV Area VTI: 2.15 cm2
AV Area mean vel: 2.08 cm2
AV Mean grad: 6 mmHg
AV Peak grad: 10.1 mmHg
Ao pk vel: 1.59 m/s
Area-P 1/2: 3.53 cm2
Height: 70 in
S' Lateral: 3.4 cm
Weight: 3580.8 oz

## 2020-06-22 LAB — LIPID PANEL
Cholesterol: 199 mg/dL (ref 0–200)
HDL: 32 mg/dL — ABNORMAL LOW (ref >40)
LDL Cholesterol: 136 mg/dL — ABNORMAL HIGH (ref 0–99)
Total CHOL/HDL Ratio: 6.2 RATIO
Triglycerides: 153 mg/dL — ABNORMAL HIGH (ref <150)
VLDL: 31 mg/dL (ref 0–40)

## 2020-06-22 LAB — PROTIME-INR
INR: 1 (ref 0.8–1.2)
Prothrombin Time: 13.1 seconds (ref 11.4–15.2)

## 2020-06-22 LAB — C-REACTIVE PROTEIN: CRP: <0.5 mg/dL (ref <1.0)

## 2020-06-22 LAB — HEMOGLOBIN A1C
Hgb A1c MFr Bld: 5.6 % (ref 4.8–5.6)
Mean Plasma Glucose: 114.02 mg/dL

## 2020-06-22 LAB — APTT: aPTT: 30 seconds (ref 24–36)

## 2020-06-22 LAB — SEDIMENTATION RATE: Sed Rate: 1 mm/hr (ref 0–16)

## 2020-06-22 MED ORDER — ASPIRIN EC 81 MG PO TBEC
81.0000 mg | DELAYED_RELEASE_TABLET | Freq: Every day | ORAL | Status: DC
  Administered 2020-06-22: 81 mg via ORAL
  Filled 2020-06-22: qty 1

## 2020-06-22 MED ORDER — SPIRONOLACTONE 12.5 MG HALF TABLET
12.5000 mg | ORAL_TABLET | Freq: Every day | ORAL | Status: DC
  Administered 2020-06-22: 12.5 mg via ORAL
  Filled 2020-06-22: qty 1

## 2020-06-22 MED ORDER — ATORVASTATIN CALCIUM 80 MG PO TABS
80.0000 mg | ORAL_TABLET | Freq: Every day | ORAL | Status: DC
  Administered 2020-06-22: 80 mg via ORAL
  Filled 2020-06-22: qty 1

## 2020-06-22 MED ORDER — CARVEDILOL 6.25 MG PO TABS: 6.2500 mg | ORAL_TABLET | Freq: Two times a day (BID) | ORAL | Status: DC

## 2020-06-22 MED ORDER — PERFLUTREN LIPID MICROSPHERE
1.0000 mL | INTRAVENOUS | Status: AC | PRN
  Administered 2020-06-22: 2 mL via INTRAVENOUS
  Filled 2020-06-22: qty 10

## 2020-06-22 MED ORDER — ENTRESTO 49-51 MG PO TABS: 1.0000 | ORAL_TABLET | Freq: Two times a day (BID) | ORAL | Status: DC

## 2020-06-22 NOTE — Progress Notes (Signed)
SLP Cancellation Note  Patient Details Name: Logan Harrison MRN: [id number] DOB: 1955-07-18   Cancelled treatment:       Reason Eval/Treat Not Completed: SLP screened, no needs identified, will sign off (Pt did not present with any acute speech, language, or cognitive-linguistic deficits on admission, MRI was normal, and no overt communication or cognitive-linguistic deficits were noted by physical therapy. A formal evaluation does not appear to be clinically indicated at this time. SLP will s/o)  Tobie Poet I. Hardin Negus, [place], [place] Office number [phone] Pager [place] 06/22/2020, 11:18 AM

## 2020-06-22 NOTE — Progress Notes (Signed)
  Echocardiogram 2D Echocardiogram has been performed.  Merrie Roof F 06/22/2020, 12:55 PM

## 2020-06-22 NOTE — Progress Notes (Signed)
OT Cancellation Note and Discharge  Patient Details Name: Logan Harrison MRN: [id number] DOB: 1956/04/06   Cancelled Treatment:    Reason Eval/Treat Not Completed: OT screened, no needs identified, will sign off. Spoke with PT and pt is independent and vision is now back to normal--no OT needs identified by PT.  [place], OTR/L Acute Rehab Services Pager [phone] Office [phone]     Almon Register 06/22/2020, 8:05 AM

## 2020-06-22 NOTE — Discharge Summary (Signed)
Physician Discharge Summary  Logan Harrison GHW:[id number] DOB: [birth year] DOA: 06/21/2020  PCP: [facility]  Admit date: 06/21/2020 Discharge date: 06/22/2020  Time spent: 35 minutes  Recommendations for Outpatient Follow-up:  Ophthalmology Dr.Rankin in 1-2days  Discharge Diagnoses:  Principal Problem:   Transient visual loss of left eye Active Problems:   Chronic diastolic CHF (congestive heart failure) ([place])   Chronic kidney disease, stage 3a ([place])   Discharge Condition: stable  Diet recommendation: low sodium  Filed Weights   06/21/20 1141 06/21/20 2057  Weight: 103.8 kg 101.5 kg    History of present illness:   Logan Harrison is a 65 y.o. male with medical history significant for diastolic CHF and VELFY-10 infection 1 year ago that was complicated by acute PE, now presenting to the emergency department with a vision disturbance.  Patient reports that he went to bed in his usual state of health last night and shortly after waking 3/5 morning noticed trouble seeing out of his left eye.  He describes his left eye vision as if there was a green film over his eye, states that he initially was unable to see his hand held in front of his left eye, but then began to experience some improvement.  The right eye was not affected and the entire left visual field was involved.  He denies any associated headache and denies any numbness or weakness.  He had never experienced this previously.  [facility] ED Course: Upon arrival to the ED, patient is found to be afebrile, saturating well on room air, and with stable blood pressure.  EKG features sinus rhythm with chronic LBBB.  Head CT is negative for acute intracranial hemorrhage or acute infarction and CTA head and neck is negative for LVO, hemodynamically significant stenosis, or dissection.  Chemistry panel features a creatinine 1.69.  CBC is unremarkable.  COVID-19 PCR is negative.  ED physician discussed case  with ophthalmology Dr.Rankin a TIA/CVA work-up was recommended.  ED discussed the case with neurology who recommended admission to the hospitalist service.  Patient was transferred to [facility] Course:   . Transient vision loss, left  - Patient noticed painless left eye vision loss shortly after waking yesterday am,  -EDP discussed case with ophthalmology Dr.Rankin, TIA/stroke work-up was recommended and he was admitted overnight for observation -MRI was negative for infarct and CTA head and neck showed no large vessel occlusions -Patient symptoms had improved last night however this morning does notice a greenish film/cloudiness over his left eye with decreased visual acuity, he denies any pain, at this time we suspect this could be an intraocular etiology like vitreous hemorrhage etc.  -Patient has been advised to follow-up with ophthalmology in 1 to 2 days,  2. CKD IIIa  - SCr is 1.69 on admission   -Baseline is around 1.4, advised to resume Entresto in 2 days  3. Chronic diastolic CHF  - Appears compensated  - continue Coreg, aldactone, entresto in 2days and lasix PRN  Consultations:  Neurology  Discharge Exam: Vitals:   06/22/20 0456 06/22/20 0759  BP: 123/60 133/69  Pulse:  63  Resp: 18 18  Temp: 97.7 F (36.5 C) 97.8 F (36.6 C)  SpO2: 99% 100%    General: AAOx3 Cardiovascular: S1S2/RRR Respiratory: CTAB Abd: Soft, soft, nontender, bowel sounds present Extremities: No edema Neuro: HEENT: Mild erythema of left eye, extraocular movements intact, pupils 4 mm and reactive Motor and sensory systems are intact, DTR  2+ and plantars are withdrawal  Discharge Instructions    Allergies as of 06/22/2020   No Known Allergies     Medication List    TAKE these medications   albuterol 108 (90 Base) MCG/ACT inhaler Commonly known as: VENTOLIN HFA Inhale 2 puffs into the lungs every 4 (four) hours as needed for wheezing or shortness of breath.    carvedilol 6.25 MG tablet Commonly known as: COREG Take 1 tablet (6.25 mg total) by mouth 2 (two) times daily.   Entresto 49-51 MG Generic drug: sacubitril-valsartan Take 1 tablet by mouth 2 (two) times daily. Start taking on: June 24, 2020 What changed: These instructions start on June 24, 2020. If you are unsure what to do until then, ask your doctor or other care provider.   furosemide 20 MG tablet Commonly known as: LASIX Take 1 tablet (20 mg total) by mouth daily as needed.   spironolactone 25 MG tablet Commonly known as: Aldactone Take 0.5 tablets (12.5 mg total) by mouth daily.      No Known Allergies  Follow-up Information    Rankin, Clent Demark, MD. Schedule an appointment as soon as possible for a visit in 2 day(s).   Specialty: Ophthalmology Why: Please call Office on Monday for appointment Contact information: [place] [address] [phone]                The results of significant diagnostics from this hospitalization (including imaging, microbiology, ancillary and laboratory) are listed below for reference.    Significant Diagnostic Studies: CT Angio Head W or Wo Contrast  Result Date: 06/21/2020 CLINICAL DATA:  Abnormal left vision EXAM: CT ANGIOGRAPHY HEAD AND NECK TECHNIQUE: Multidetector CT imaging of the head and neck was performed using the standard protocol during bolus administration of intravenous contrast. Multiplanar CT image reconstructions and MIPs were obtained to evaluate the vascular anatomy. Carotid stenosis measurements (when applicable) are obtained utilizing NASCET criteria, using the distal internal carotid diameter as the denominator. CONTRAST:  7mL OMNIPAQUE IOHEXOL 350 MG/ML SOLN COMPARISON:  None. FINDINGS: CT HEAD Brain: There is no acute intracranial hemorrhage, mass effect, or edema. Gray-white differentiation is preserved. There is no extra-axial fluid collection. Ventricles and sulci are within normal limits in  size and configuration. Vascular: No hyperdense vessel or unexpected calcification. Skull: Calvarium is unremarkable. Sinuses/Orbits: No acute finding. Other: None. Review of the MIP images confirms the above findings CTA NECK Aortic arch: Great vessel origins patent. Right carotid system: Patent. No stenosis. No evidence of dissection. Left carotid system: Patent. No stenosis. No evidence of dissection Vertebral arteries: Patent and codominant. No stenosis. No evidence of dissection Skeleton: Degenerative changes of the cervical spine. Other neck: No significant abnormality. Upper chest: No apical lung mass. Review of the MIP images confirms the above findings CTA HEAD Anterior circulation: Intracranial internal carotid arteries patent. Anterior cerebral arteries are patent. Anterior communicating artery is present. Middle cerebral arteries are patent. Posterior circulation: Intracranial vertebral arteries and basilar artery are patent. Major cerebellar artery branch origins are patent. Posterior cerebral arteries are patent. Venous sinuses: Patent as allowed by contrast bolus timing. Review of the MIP images confirms the above findings IMPRESSION: No acute intracranial hemorrhage or evidence of acute infarction. No large vessel occlusion, hemodynamically significant stenosis, or evidence of dissection. Electronically Signed   By: Macy Mis M.D.   On: 06/21/2020 14:43   CT Angio Neck W and/or Wo Contrast  Result Date: 06/21/2020 CLINICAL DATA:  Abnormal left vision EXAM:  CT ANGIOGRAPHY HEAD AND NECK TECHNIQUE: Multidetector CT imaging of the head and neck was performed using the standard protocol during bolus administration of intravenous contrast. Multiplanar CT image reconstructions and MIPs were obtained to evaluate the vascular anatomy. Carotid stenosis measurements (when applicable) are obtained utilizing NASCET criteria, using the distal internal carotid diameter as the denominator. CONTRAST:  59mL  OMNIPAQUE IOHEXOL 350 MG/ML SOLN COMPARISON:  None. FINDINGS: CT HEAD Brain: There is no acute intracranial hemorrhage, mass effect, or edema. Gray-white differentiation is preserved. There is no extra-axial fluid collection. Ventricles and sulci are within normal limits in size and configuration. Vascular: No hyperdense vessel or unexpected calcification. Skull: Calvarium is unremarkable. Sinuses/Orbits: No acute finding. Other: None. Review of the MIP images confirms the above findings CTA NECK Aortic arch: Great vessel origins patent. Right carotid system: Patent. No stenosis. No evidence of dissection. Left carotid system: Patent. No stenosis. No evidence of dissection Vertebral arteries: Patent and codominant. No stenosis. No evidence of dissection Skeleton: Degenerative changes of the cervical spine. Other neck: No significant abnormality. Upper chest: No apical lung mass. Review of the MIP images confirms the above findings CTA HEAD Anterior circulation: Intracranial internal carotid arteries patent. Anterior cerebral arteries are patent. Anterior communicating artery is present. Middle cerebral arteries are patent. Posterior circulation: Intracranial vertebral arteries and basilar artery are patent. Major cerebellar artery branch origins are patent. Posterior cerebral arteries are patent. Venous sinuses: Patent as allowed by contrast bolus timing. Review of the MIP images confirms the above findings IMPRESSION: No acute intracranial hemorrhage or evidence of acute infarction. No large vessel occlusion, hemodynamically significant stenosis, or evidence of dissection. Electronically Signed   By: Macy Mis M.D.   On: 06/21/2020 14:43   MR BRAIN WO CONTRAST  Result Date: 06/21/2020 CLINICAL DATA:  Initial evaluation for acute TIA, decreased left-sided vision. EXAM: MRI HEAD WITHOUT CONTRAST TECHNIQUE: Multiplanar, multiecho pulse sequences of the brain and surrounding structures were obtained without  intravenous contrast. COMPARISON:  Prior CT from earlier the same day. FINDINGS: Brain: Cerebral volume within normal limits. No significant cerebral white matter disease for age. No abnormal foci of restricted diffusion to suggest acute or subacute ischemia. Gray-white matter differentiation maintained. No encephalomalacia to suggest chronic cortical infarction. No evidence for acute or chronic intracranial hemorrhage. No mass lesion, midline shift or mass effect. No hydrocephalus or extra-axial fluid collection. Pituitary gland suprasellar region within normal limits. Midline structures intact and normal. Vascular: Major intracranial vascular flow voids are well maintained. Skull and upper cervical spine: Craniocervical junction normal. Bone marrow signal intensity within normal limits. No scalp soft tissue abnormality. Sinuses/Orbits: Globes and orbital soft tissues demonstrate no acute finding. Small air-fluid level noted within the right sphenoid sinus. Additional scattered mucosal thickening noted within the ethmoidal air cells. No mastoid effusion. Inner ear structures grossly normal. Other: None. IMPRESSION: 1. Normal brain MRI for age. No acute intracranial abnormality identified. 2. Mild sphenoethmoidal sinus disease. Electronically Signed   By: Jeannine Boga M.D.   On: 06/21/2020 23:32   ECHOCARDIOGRAM COMPLETE  Result Date: 06/22/2020    ECHOCARDIOGRAM REPORT   Patient Name:   STERLIN KNIGHTLY Mcintyre Date of Exam: 06/22/2020 Medical Rec #:  [id number]        Height:       70.0 in Accession #:    [id number]       Weight:       223.8 lb Date of Birth:  1955-09-09        BSA:  2.189 m Patient Age:    65 years         BP:           133/69 mmHg Patient Gender: M                HR:           71 bpm. Exam Location:  Inpatient Procedure: 2D Echo, Cardiac Doppler, Color Doppler and Intracardiac            Opacification Agent Indications:    TIA  History:        Patient has prior history of Echocardiogram  examinations, most                 recent 11/27/2019. Acute left-side vision loss.  Sonographer:    Merrie Roof RDCS Referring Phys: [place]  1. Left ventricular ejection fraction, by estimation, is 60 to 65%. The left ventricle has normal function. The left ventricle has no regional wall motion abnormalities. Left ventricular diastolic parameters are consistent with Grade I diastolic dysfunction (impaired relaxation).  2. Right ventricular systolic function is normal. The right ventricular size is normal.  3. Left atrial size was moderately dilated.  4. The mitral valve is grossly normal. Trivial mitral valve regurgitation. No evidence of mitral stenosis.  5. The aortic valve is normal in structure. Aortic valve regurgitation is not visualized. No aortic stenosis is present. Comparison(s): No significant change from prior study. Conclusion(s)/Recommendation(s): Otherwise normal echocardiogram, with minor abnormalities described in the report. FINDINGS  Left Ventricle: Left ventricular ejection fraction, by estimation, is 60 to 65%. The left ventricle has normal function. The left ventricle has no regional wall motion abnormalities. Definity contrast agent was given IV to delineate the left ventricular  endocardial borders. The left ventricular internal cavity size was normal in size. There is no left ventricular hypertrophy. Abnormal (paradoxical) septal motion, consistent with left bundle branch block. Left ventricular diastolic parameters are consistent with Grade I diastolic dysfunction (impaired relaxation). Right Ventricle: The right ventricular size is normal. Right vetricular wall thickness was not well visualized. Right ventricular systolic function is normal. Left Atrium: Left atrial size was moderately dilated. Right Atrium: Right atrial size was normal in size. Pericardium: There is no evidence of pericardial effusion. Mitral Valve: The mitral valve is grossly normal. Trivial  mitral valve regurgitation. No evidence of mitral valve stenosis. Tricuspid Valve: The tricuspid valve is grossly normal. Tricuspid valve regurgitation is trivial. No evidence of tricuspid stenosis. Aortic Valve: The aortic valve is normal in structure. Aortic valve regurgitation is not visualized. No aortic stenosis is present. Aortic valve mean gradient measures 6.0 mmHg. Aortic valve peak gradient measures 10.1 mmHg. Aortic valve area, by VTI measures 2.15 cm. Pulmonic Valve: The pulmonic valve was not well visualized. Pulmonic valve regurgitation is not visualized. Aorta: The aortic root and ascending aorta are structurally normal, with no evidence of dilitation. Venous: The inferior vena cava was not well visualized. IAS/Shunts: The atrial septum is grossly normal.  LEFT VENTRICLE PLAX 2D LVIDd:         5.40 cm  Diastology LVIDs:         3.40 cm  LV e' medial:    5.98 cm/s LV PW:         0.70 cm  LV E/e' medial:  14.5 LV IVS:        0.90 cm  LV e' lateral:   11.70 cm/s LVOT diam:     2.00 cm  LV E/e' lateral: 7.4 LV SV:         73 LV SV Index:   33 LVOT Area:     3.14 cm  RIGHT VENTRICLE RV Basal diam:  3.20 cm LEFT ATRIUM              Index       RIGHT ATRIUM           Index LA diam:        3.60 cm  1.64 cm/m  RA Area:     15.20 cm LA Vol (A2C):   113.0 ml 51.61 ml/m RA Volume:   36.80 ml  16.81 ml/m LA Vol (A4C):   98.1 ml  44.81 ml/m LA Biplane Vol: 107.0 ml 48.87 ml/m  AORTIC VALVE AV Area (Vmax):    2.05 cm AV Area (Vmean):   2.08 cm AV Area (VTI):     2.15 cm AV Vmax:           159.00 cm/s AV Vmean:          112.000 cm/s AV VTI:            0.339 m AV Peak Grad:      10.1 mmHg AV Mean Grad:      6.0 mmHg LVOT Vmax:         104.00 cm/s LVOT Vmean:        74.300 cm/s LVOT VTI:          0.232 m LVOT/AV VTI ratio: 0.68  AORTA Ao Root diam: 3.50 cm Ao Asc diam:  3.40 cm MITRAL VALVE MV Area (PHT): 3.53 cm     SHUNTS MV Decel Time: 215 msec     Systemic VTI:  0.23 m MV E velocity: 86.50 cm/s    Systemic Diam: 2.00 cm MV A velocity: 108.00 cm/s MV E/A ratio:  0.80 Buford Dresser MD Electronically signed by Buford Dresser MD Signature Date/Time: 06/22/2020/2:23:45 PM    Final     Microbiology: Recent Results (from the past 240 hour(s))  SARS Coronavirus 2 by RT PCR (hospital order, performed in [place] hospital lab)     Status: None   Collection Time: 06/21/20  1:04 PM  Result Value Ref Range Status   SARS Coronavirus 2 NEGATIVE NEGATIVE Final    Comment: (NOTE) SARS-CoV-2 target nucleic acids are NOT DETECTED.  The SARS-CoV-2 RNA is generally detectable in upper and lower respiratory specimens during the acute phase of infection. The lowest concentration of SARS-CoV-2 viral copies this assay can detect is 250 copies / mL. A negative result does not preclude SARS-CoV-2 infection and should not be used as the sole basis for treatment or other patient management decisions.  A negative result may occur with improper specimen collection / handling, submission of specimen other than nasopharyngeal swab, presence of viral mutation(s) within the areas targeted by this assay, and inadequate number of viral copies (<250 copies / mL). A negative result must be combined with clinical observations, patient history, and epidemiological information.  Fact Sheet for Patients:   StrictlyIdeas.no  Fact Sheet for Healthcare Providers: BankingDealers.co.za  This test is not yet approved or  cleared by the [place] FDA and has been authorized for detection and/or diagnosis of SARS-CoV-2 by FDA under an Emergency Use Authorization (EUA).  This EUA will remain in effect (meaning this test can be used) for the duration of the COVID-19 declaration under Section 564(b)(1) of the Act, 21 U.S.C. section 360bbb-3(b)(1), unless the authorization is  terminated or revoked sooner.  Performed at [facility], [address] [place], [place] [address]      Labs: Basic Metabolic Panel: Recent Labs  Lab 06/21/20 1304  NA 139  K 4.1  CL 106  CO2 24  GLUCOSE 95  BUN 23  CREATININE 1.69*  CALCIUM 9.0   Liver Function Tests: Recent Labs  Lab 06/21/20 1304  AST 25  ALT 19  ALKPHOS 49  BILITOT 0.8  PROT 6.0*  ALBUMIN 3.7   No results for input(s): LIPASE, AMYLASE in the last 168 hours. No results for input(s): AMMONIA in the last 168 hours. CBC: Recent Labs  Lab 06/21/20 1304  WBC 6.5  NEUTROABS 3.4  HGB 14.4  HCT 42.4  MCV 94.9  PLT 213   Cardiac Enzymes: No results for input(s): CKTOTAL, CKMB, CKMBINDEX, TROPONINI in the last 168 hours. BNP: BNP (last 3 results) Recent Labs    08/12/19 0816 08/13/19 0258 08/23/19 1429  BNP 342.1* 85.1 40.6    ProBNP (last 3 results) No results for input(s): PROBNP in the last 8760 hours.  CBG: No results for input(s): GLUCAP in the last 168 hours.     Signed:  Domenic Polite MD.  [facility] 06/22/2020, 3:09 PM

## 2020-06-22 NOTE — Discharge Instructions (Signed)
Stroke Prevention Some medical conditions and lifestyle choices can lead to a higher risk for a stroke. You can help to prevent a stroke by eating healthy foods and exercising. It also helps to not smoke and to manage any health problems you may have. How can this condition affect me? A stroke is an emergency. It should be treated right away. A stroke can lead to brain damage or threaten your life. There is a better chance of surviving and getting better after a stroke if you get medical help right away. What can increase my risk? The following medical conditions may increase your risk of a stroke:  Diseases of the heart and blood vessels (cardiovascular disease).  High blood pressure (hypertension).  Diabetes.  High cholesterol.  Sickle cell disease.  Problems with blood clotting.  Being very overweight.  Sleeping problems (obstructivesleep apnea). Other risk factors include:  Being older than age 60.  A history of blood clots, stroke, or mini-stroke (TIA).  Race, ethnic background, or a family history of stroke.  Smoking or using tobacco products.  Taking birth control pills, especially if you smoke.  Heavy alcohol and drug use.  Not being active. What actions can I take to prevent this? Manage your health conditions  High cholesterol. ? Eat a healthy diet. If this is not enough to manage your cholesterol, you may need to take medicines. ? Take medicines as told by your doctor.  High blood pressure. ? Try to keep your blood pressure below 130/80. ? If your blood pressure cannot be managed through a healthy diet and regular exercise, you may need to take medicines. ? Take medicines as told by your doctor. ? Ask your doctor if you should check your blood pressure at home. ? Have your blood pressure checked every year.  Diabetes. ? Eat a healthy diet and get regular exercise. If your blood sugar (glucose) cannot be managed through diet and exercise, you may need to  take medicines. ? Take medicines as told by your doctor.  Talk to your doctor about getting checked for sleeping problems. Signs of a problem can include: ? Snoring a lot. ? Feeling very tired.  Make sure that you manage any other conditions you have. Nutrition  Follow instructions from your doctor about what to eat or drink. You may be told to: ? Eat and drink fewer calories each day. ? Limit how much salt (sodium) you use to 1,500 milligrams (mg) each day. ? Use only healthy fats for cooking, such as olive oil, canola oil, and sunflower oil. ? Eat healthy foods. To do this:  Choose foods that are high in fiber. These include whole grains, and fresh fruits and vegetables.  Eat at least 5 servings of fruits and vegetables a day. Try to fill one-half of your plate with fruits and vegetables at each meal.  Choose low-fat (lean) proteins. These include low-fat cuts of meat, chicken without skin, fish, tofu, beans, and nuts.  Eat low-fat dairy products. ? Avoid foods that:  Are high in salt.  Have saturated fat.  Have trans fat.  Have cholesterol.  Are processed or pre-made. ? Count how many carbohydrates you eat and drink each day.   Lifestyle  If you drink alcohol: ? Limit how much you have to:  0-1 drink a day for women who are not pregnant.  0-2 drinks a day for men. ? Know how much alcohol is in your drink. In the [place] one drink equals one 12 oz bottle   of beer (355mL), one 5 oz glass of wine (148mL), or one 1 oz glass of hard liquor (44mL).  Do not smoke or use any products that have nicotine or tobacco. If you need help quitting, ask your doctor.  Avoid secondhand smoke.  Do not use drugs. Activity  Try to stay at a healthy weight.  Get at least 30 minutes of exercise on most days, such as: ? Fast walking. ? Biking. ? Swimming.   Medicines  Take over-the-counter and prescription medicines only as told by your doctor.  Avoid taking birth control pills.  Talk to your doctor about the risks of taking birth control pills if: ? You are over 35 years old. ? You smoke. ? You get very bad headaches. ? You have had a blood clot. Where to find more information  American Stroke Association: www.strokeassociation.org Get help right away if:  You or a loved one has any signs of a stroke. "BE FAST" is an easy way to remember the warning signs: ? B - Balance. Dizziness, sudden trouble walking, or loss of balance. ? E - Eyes. Trouble seeing or a change in how you see. ? F - Face. Sudden weakness or loss of feeling of the face. The face or eyelid may droop on one side. ? A - Arms. Weakness or loss of feeling in an arm. This happens all of a sudden and most often on one side of the body. ? S - Speech. Sudden trouble speaking, slurred speech, or trouble understanding what people say. ? T - Time. Time to call emergency services. Write down what time symptoms started.  You or a loved one has other signs of a stroke, such as: ? A sudden, very bad headache with no known cause. ? Feeling like you may vomit (nausea). ? Vomiting. ? A seizure. These symptoms may be an emergency. Get help right away. Call your local emergency services (911 in the [place]  Do not wait to see if the symptoms will go away.  Do not drive yourself to the hospital. Summary  You can help to prevent a stroke by eating healthy, exercising, and not smoking. It also helps to manage any health problems you have.  Do not smoke or use any products that contain nicotine or tobacco.  Get help right away if you or a loved one has any signs of a stroke. This information is not intended to replace advice given to you by your health care provider. Make sure you discuss any questions you have with your health care provider. Document Revised: 11/05/2019 Document Reviewed: 11/05/2019 Elsevier Patient Education  2021 [employer].  

## 2020-06-22 NOTE — Evaluation (Signed)
Physical Therapy Evaluation and Discharge Patient Details Name: Logan Harrison MRN: [id number] DOB: 10-07-1955 Today's Date: 06/22/2020   History of Present Illness  Pt is a 65 y/o male admtited secondary to transient visual loss in L eye. Pt reports symptoms have since resolved since admission. MRI negative. PMH includes dCHF, CKD, COVID, PE, and prostate cancer.  Clinical Impression  Patient evaluated by Physical Therapy with no further acute PT needs identified. All education has been completed and the patient has no further questions. Pt overall at an independent level with gait and stair navigation. No LOB noted and scored 24 on DGI. Pt reports visual deficits have resolved and feels he is back to his baseline.  See below for any follow-up Physical Therapy or equipment needs. PT is signing off. Thank you for this referral. If needs change, please re-consult.      Follow Up Recommendations No PT follow up    Equipment Recommendations  None recommended by PT    Recommendations for Other Services       Precautions / Restrictions Precautions Precautions: None Restrictions Weight Bearing Restrictions: No      Mobility  Bed Mobility               General bed mobility comments: Standing in room with NT    Transfers Overall transfer level: Independent Equipment used: None                Ambulation/Gait Ambulation/Gait assistance: Independent Gait Distance (Feet): 300 Feet Assistive device: None Gait Pattern/deviations: WFL(Within Functional Limits) Gait velocity: WFL   General Gait Details: Overall steady gait. No LOB noted. Able to perform dynamic gait tasks without LOB.  Stairs Stairs: Yes Stairs assistance: Independent Stair Management: No rails;Forwards Number of Stairs: 2 General stair comments: Able to perform stair navigation with no rails and no LOB. Overall independent. Reports no visual difficulty seeing steps during navigation.  Wheelchair  Mobility    Modified Rankin (Stroke Patients Only)       Balance Overall balance assessment: Independent                               Standardized Balance Assessment Standardized Balance Assessment : Dynamic Gait Index   Dynamic Gait Index Level Surface: Normal Change in Gait Speed: Normal Gait with Horizontal Head Turns: Normal Gait with Vertical Head Turns: Normal Gait and Pivot Turn: Normal Step Over Obstacle: Normal Step Around Obstacles: Normal Steps: Normal Total Score: 24       Pertinent Vitals/Pain Pain Assessment: No/denies pain    Home Living Family/patient expects to be discharged to:: Private residence Living Arrangements: Other relatives Available Help at Discharge: Family;Available PRN/intermittently Type of Home: House Home Access: Stairs to enter Entrance Stairs-Rails: None Entrance Stairs-Number of Steps: 2 Home Layout: Two level;Able to live on main level with bedroom/bathroom Home Equipment: None      Prior Function Level of Independence: Independent         Comments: Works for the [employer]   Dominant Hand: Right    Extremity/Trunk Assessment   Upper Extremity Assessment Upper Extremity Assessment: Overall WFL for tasks assessed    Lower Extremity Assessment Lower Extremity Assessment: Overall WFL for tasks assessed    Cervical / Trunk Assessment Cervical / Trunk Assessment: Normal  Communication   Communication: No difficulties  Cognition Arousal/Alertness: Awake/alert Behavior During Therapy: WFL for tasks assessed/performed Overall Cognitive Status:  Within Functional Limits for tasks assessed                                        General Comments General comments (skin integrity, edema, etc.): Reports visual deficits have resolved. When tested able to read at a distance with no blurriness, etc.    Exercises     Assessment/Plan    PT Assessment Patent does  not need any further PT services  PT Problem List         PT Treatment Interventions      PT Goals (Current goals can be found in the Care Plan section)  Acute Rehab PT Goals Patient Stated Goal: to go home PT Goal Formulation: With patient Time For Goal Achievement: 06/22/20 Potential to Achieve Goals: Good    Frequency     Barriers to discharge        Co-evaluation               AM-PAC PT "6 Clicks" Mobility  Outcome Measure Help needed turning from your back to your side while in a flat bed without using bedrails?: None Help needed moving from lying on your back to sitting on the side of a flat bed without using bedrails?: None Help needed moving to and from a bed to a chair (including a wheelchair)?: None Help needed standing up from a chair using your arms (e.g., wheelchair or bedside chair)?: None Help needed to walk in hospital room?: None Help needed climbing 3-5 steps with a railing? : None 6 Click Score: 24    End of Session   Activity Tolerance: Patient tolerated treatment well Patient left: in bed;with call bell/phone within reach Nurse Communication: Mobility status PT Visit Diagnosis: Other symptoms and signs involving the nervous system (I34.742)    Time: 5956-3875 PT Time Calculation (min) (ACUTE ONLY): 10 min   Charges:   PT Evaluation $PT Eval Low Complexity: 1 Low          Lou Miner, DPT  Acute Rehabilitation Services  Pager: [phone] Office: [phone]   Rudean Hitt 06/22/2020, 8:11 AM

## 2020-06-22 NOTE — Consult Note (Signed)
Neurology Consultation  Reason for Consult: transient loss of vision OS Referring Physician: Fanny Bien, MD  CC: transient loss of vision OS  History is obtained from: patient, chart  HPI: Logan Harrison is a 65 y.o. male with a PMHx of CdHF, COVID one yr ago complicated by acute PE, CKD III, OSA, HLD, LBBB and prostate cancer who presented to [facility] yesterday for loss of vision in left eye. Per Patient, he went to bed on 06/20/20 in usual state of health. He woke up around 0700 hrs on 06/21/20 to go void and his vision was normal. He layed back down for about an hour and when he woke up again, he could not see anything but light from left eye. States he could not even see fingers in front of the left side of face. After 30 mins, he had partial return of vision stating it was blurry with a green film over the eye. After two more hours of his vision not returning to complete normalcy, he went to ED. States at one point, his vision completely cleared, but then returned to the green film and blurriness. He denies HA at start of vision loss. No dysarthria, weakness, numbness, aphasia, dizziness, tunnel vision, or loss of peripheral vision. Wears glasses. No history of migraines. No history of stroke. He has never been told he had carotid or intracranial artery stenosis. He is no longer on AC from the PE one year ago.   His OD sclera and conjunctiva are reddened. No discharge or itching. States he has rubbed his eye.   In the ED, CTH was negative for acute finding. CTA head and neck negative for LVO or significant stenosis or dissection. He was transferred here for stroke workup.   At last PCP, it was documented that his LDL was 131, CdHF (grade I) with 60-65% EF which is recovered from 30%. Taken off Xarelto in 11/21.   Neurology was asked to consult due to vision changes.   Workup thusfar reveals an LDL of 136, HbA1c of 5.6, neg UDS and UA.    ROS: A 14 point ROS was performed and is negative except as  noted in the HPI.   Past Medical History:  Diagnosis Date   CHF (congestive heart failure) (HCC)    Melanoma ([place])    Prostate cancer ([place])   LBBB CKD III Covid 3/41 with PE complication OSA  Family History  Problem Relation Age of Onset   Diabetes Mother    Hypertension Mother    Cancer Father    Heart disease Father     Social History:   reports that he has never smoked. He has never used smokeless tobacco. He reports current alcohol use. He reports that he does not use drugs.  Medications  Current Facility-Administered Medications:    0.9 %  sodium chloride infusion, , Intravenous, Continuous, Opyd, Ilene Qua, MD, Last Rate: 75 mL/hr at 06/21/20 2332, New Bag at 06/21/20 2332   acetaminophen (TYLENOL) tablet 650 mg, 650 mg, Oral, Q4H PRN, 650 mg at 06/21/20 2331 **OR** acetaminophen (TYLENOL) 160 MG/5ML solution 650 mg, 650 mg, Per Tube, Q4H PRN **OR** acetaminophen (TYLENOL) suppository 650 mg, 650 mg, Rectal, Q4H PRN, Opyd, Timothy S, MD   albuterol (PROVENTIL) (2.5 MG/3ML) 0.083% nebulizer solution 2.5 mg, 2.5 mg, Nebulization, Q4H PRN, Opyd, Timothy S, MD   enoxaparin (LOVENOX) injection 40 mg, 40 mg, Subcutaneous, Q24H, Opyd, Ilene Qua, MD, 40 mg at 06/21/20 2332   senna-docusate (Senokot-S) tablet 1 tablet,  1 tablet, Oral, QHS PRN, Opyd, Ilene Qua, MD   Exam: Current vital signs: BP 133/69 (BP Location: Left Arm)   Pulse 63   Temp 97.8 F (36.6 C) (Oral)   Resp 18   Ht $R'5\' 10"'xl$  (1.778 m)   Wt 101.5 kg   SpO2 100%   BMI 32.11 kg/m  Vital signs in last 24 hours: Temp:  [97.7 F (36.5 C)-98.2 F (36.8 C)] 97.8 F (36.6 C) (03/06 0759) Pulse Rate:  [57-66] 63 (03/06 0759) Resp:  [14-18] 18 (03/06 0759) BP: (114-156)/(55-99) 133/69 (03/06 0759) SpO2:  [97 %-100 %] 100 % (03/06 0759) Weight:  [101.5 kg-103.8 kg] 101.5 kg (03/05 2057)  GENERAL: Awake, alert in NAD HEENT: - Normocephalic and atraumatic. No lymphadenopathy. OS conjunctiva and sclera reddening.  No drainage noted.  LUNGS - Normal respiratory effort.  CV - RRR ABDOMEN - Soft, nontender Ext: warm, well perfused Psych: Affect light, calm.   NEURO:  Mental Status: AA&Ox3  Speech/Language: speech is without dysarthria or aphasia. Naming, repetition, fluency, and comprehension intact. Cranial Nerves:  II: PERRL 78mm and reactive OD, 66mm OS mm/brisk. visual fields full.  III, IV, VI: EOMI. Lid elevation symmetric and full.  V: sensation is intact and symmetrical to face. Blinks to threat. Moves jaw back and forth.  VII: Smile is symmetrical. Able to puff cheeks and raise eyebrows.  VIII:hearing intact to voice IX, X: palate elevation is symmetric. Phonation normal.  XI: normal sternocleidomastoid and trapezius muscle strength VZD:GLOVFI is symmetrical without fasciculations.   Motor: 5/5 strength is all muscle groups.  Tone is normal. Bulk is normal.  Sensation- Intact to light touch bilaterally in all four extremities. Extinction absent to light touch to DSS.  Coordination: FTN intact bilaterally. HKS intact bilaterally. No pronator drift.  DTRs: 2+ throughout.  Gait- deferred  SNELLEN: OD 20/40  OS 20/50 with glasses on.   Labs I have reviewed labs in epic and the results pertinent to this consultation are: As per HPI.   CBC    Component Value Date/Time   WBC 6.5 06/21/2020 1304   RBC 4.47 06/21/2020 1304   HGB 14.4 06/21/2020 1304   HCT 42.4 06/21/2020 1304   PLT 213 06/21/2020 1304   MCV 94.9 06/21/2020 1304   MCH 32.2 06/21/2020 1304   MCHC 34.0 06/21/2020 1304   RDW 11.5 06/21/2020 1304   LYMPHSABS 1.9 06/21/2020 1304   MONOABS 0.7 06/21/2020 1304   EOSABS 0.4 06/21/2020 1304   BASOSABS 0.1 06/21/2020 1304    CMP     Component Value Date/Time   NA 139 06/21/2020 1304   NA 141 11/15/2019 1554   K 4.1 06/21/2020 1304   CL 106 06/21/2020 1304   CO2 24 06/21/2020 1304   GLUCOSE 95 06/21/2020 1304   BUN 23 06/21/2020 1304   BUN 28 (H) 11/15/2019 1554    CREATININE 1.69 (H) 06/21/2020 1304   CALCIUM 9.0 06/21/2020 1304   PROT 6.0 (L) 06/21/2020 1304   PROT 6.5 08/23/2019 1429   ALBUMIN 3.7 06/21/2020 1304   ALBUMIN 3.5 (L) 08/23/2019 1429   AST 25 06/21/2020 1304   ALT 19 06/21/2020 1304   ALKPHOS 49 06/21/2020 1304   BILITOT 0.8 06/21/2020 1304   BILITOT 0.4 08/23/2019 1429   GFRNONAA 45 (L) 06/21/2020 1304   GFRAA 59 (L) 11/15/2019 1554    Lipid Panel     Component Value Date/Time   CHOL 199 06/22/2020 0255   TRIG 153 (H) 06/22/2020 0255  HDL 32 (L) 06/22/2020 0255   CHOLHDL 6.2 06/22/2020 0255   VLDL 31 06/22/2020 0255   LDLCALC 136 (H) 06/22/2020 0255    Imaging  MRI brain:  1.Normal brain MRI for age. No acute intracranial abnormality identified. 2. Mild sphenoethmoidal sinus disease.  CTA head and neck:  No acute intracranial hemorrhage or evidence of acute infarction.  No large vessel occlusion, hemodynamically significant stenosis, or evidence of dissection.  *no carotid stenosis or dissection   Assessment: 65 yo male who presented with transient vision loss in left eye which progressed from not being able to see at all, to blurry vision, to complete recovery, and back to blurriness. Do not feel this is CRAO due to no flashing lights with immediate vision loss and no loss of small temporal island of vision. BRAO would normally be a vision loss of only one visual field accompanied by motor or sensory loss. Doubt GCA due to lack of temporal pain and neck pain. Doubt atypical migraine due to presentation. There was no LVO or carotid stenosis noted on imaging, so carotid stenosis as the cause is unlikely. Given above, most likely cause is amaurosis fugax. He needs stroke workup with reduction of risk factors which has been ordered by medicine team.   Impression: amaurosis fugax  Recommendations: -ASA 81mg  po qd -check ESR/CRP/coag panel -Start statin-lipitor 80mg  po qd -stroke risk reduction -f/up with  ophthalmology and neurology out patient.  -caution with driving, but he does have peripheral vision.   Pt seen by Clance Boll, NP/Neuro and patient was discharged home prior to attending neurologist seeing patient.  Pager: [phone]

## 2020-08-21 ENCOUNTER — Encounter: Payer: Self-pay | Admitting: Cardiovascular Disease

## 2020-08-21 ENCOUNTER — Other Ambulatory Visit: Payer: Self-pay

## 2020-08-21 ENCOUNTER — Ambulatory Visit (INDEPENDENT_AMBULATORY_CARE_PROVIDER_SITE_OTHER): Payer: Managed Care, Other (non HMO) | Admitting: Cardiovascular Disease

## 2020-08-21 VITALS — BP 94/60 | HR 74 | Ht 70.0 in | Wt 205.8 lb

## 2020-08-21 DIAGNOSIS — I447 Left bundle-branch block, unspecified: Secondary | ICD-10-CM | POA: Diagnosis not present

## 2020-08-21 DIAGNOSIS — I5022 Chronic systolic (congestive) heart failure: Secondary | ICD-10-CM

## 2020-08-21 DIAGNOSIS — E782 Mixed hyperlipidemia: Secondary | ICD-10-CM | POA: Diagnosis not present

## 2020-08-21 DIAGNOSIS — G4733 Obstructive sleep apnea (adult) (pediatric): Secondary | ICD-10-CM

## 2020-08-21 MED ORDER — ENTRESTO 49-51 MG PO TABS: 1.0000 | ORAL_TABLET | Freq: Two times a day (BID) | ORAL | Status: DC

## 2020-08-21 MED ORDER — SPIRONOLACTONE 25 MG PO TABS: 12.5000 mg | ORAL_TABLET | Freq: Every day | ORAL | 1 refills | Status: DC

## 2020-08-21 MED ORDER — CARVEDILOL 6.25 MG PO TABS: 6.2500 mg | ORAL_TABLET | Freq: Two times a day (BID) | ORAL | 3 refills | Status: DC

## 2020-08-21 NOTE — Patient Instructions (Signed)
Medication Instructions:  The current medical regimen is effective;  continue present plan and medications.  *If you need a refill on your cardiac medications before your next appointment, please call your pharmacy*   Follow-Up: At [facility], you and your health needs are our priority.  As part of our continuing mission to provide you with exceptional heart care, we have created designated Provider Care Teams.  These Care Teams include your primary Cardiologist (physician) and Advanced Practice Providers (APPs -  Physician Assistants and Nurse Practitioners) who all work together to provide you with the care you need, when you need it.  We recommend signing up for the patient portal called "MyChart".  Sign up information is provided on this After Visit Summary.  MyChart is used to connect with patients for Virtual Visits (Telemedicine).  Patients are able to view lab/test results, encounter notes, upcoming appointments, etc.  Non-urgent messages can be sent to your provider as well.   To learn more about what you can do with MyChart, go to [email or web address].    Your next appointment:   12 month(s)  The format for your next appointment:   In Person  Provider:   You may see Williamson T O'Neal, MD or one of the following Advanced Practice Providers on your designated Care Team:   Hao Meng, PA-C Angela Duke, PA-C or  Krista Kroeger, PA-C    

## 2020-08-21 NOTE — Progress Notes (Signed)
Cardiology Office Note:   Date:  08/21/2020  NAME:  Logan Harrison    MRN: [id number] DOB:  [birth year]   PCP:  [facility]  Cardiologist:  Evalina Field, MD  Electrophysiologist:  None   Referring MD: [facility]*   Chief Complaint  Patient presents with  . Follow-up   History of Present Illness:   Logan Harrison is a 65 y.o. male with a hx of systolic HF with recovery of EF, LBBB, OSA, metastatic melanoma who presents for follow-up.  He reports he is doing fairly well.  He is not that active since his recent intracerebral hemorrhage.  He was doing quite a bit of activity before.  Given his diagnosis of metastatic melanoma things to slow down.  He denies any chest pain or shortness of breath.  Weights are stable.  BP in office 94/60.  Denies any dizziness or lightheadedness.  His blood pressure is never over 120 mmHg.  Despite this he reports no symptoms.  He overall is doing well from a heart standpoint.  Euvolemic on examination.  No issues with current medications.  An echocardiogram was obtained last month when he was at [facility].  EF 55%.  He also was diagnosed with moderate sleep apnea with nocturnal hypoxemia.  An in lab sleep study was recommended.  Apparently this has not been followed up on.  Problem List 1. Systolic HF -11/5460 -EF 70-35% -> 60-65% (11/27/2019) -NM stress with LBBB artifact 2. LBBB -QRS 160 ms -QRS 170 (2014-2019 per old EKGs) 3. DVT  -2/2 covid PNA(07/2019) 4. Prostate CA 5. HLD 6. OSA -moderate 7. 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 8. Metastatic melanoma  -eye/bone/brain? -on nivolumab   Past Medical History: Past Medical History:  Diagnosis Date  . CHF (congestive heart failure) ([place])   . Hyperlipidemia   . Hypertension   . ICH (intracerebral hemorrhage) ([place])   . LBBB (left bundle branch block)   . Melanoma ([place])    Metastatic  . Prostate cancer [facility])     Past Surgical History: Past Surgical  History:  Procedure Laterality Date  . APPENDECTOMY    . CHOLECYSTECTOMY    . PROSTATECTOMY      Current Medications: Current Meds  Medication Sig  . albuterol (VENTOLIN HFA) 108 (90 Base) MCG/ACT inhaler Inhale 2 puffs into the lungs every 4 (four) hours as needed for wheezing or shortness of breath.  . furosemide (LASIX) 20 MG tablet Take 1 tablet (20 mg total) by mouth daily as needed. (Patient taking differently: Take 20 mg by mouth as needed for fluid.)  . Nivolumab (OPDIVO IV) Inject into the vein. IV infusion every 4 weeks.  . [DISCONTINUED] carvedilol (COREG) 6.25 MG tablet Take 1 tablet (6.25 mg total) by mouth 2 (two) times daily.  . [DISCONTINUED] sacubitril-valsartan (ENTRESTO) 49-51 MG Take 1 tablet by mouth 2 (two) times daily.  . [DISCONTINUED] spironolactone (ALDACTONE) 25 MG tablet Take 0.5 tablets (12.5 mg total) by mouth daily.     Allergies:    Patient has no known allergies.   Social History: Social History   Socioeconomic History  . Marital status: Single    Spouse name: Not on file  . Number of children: Not on file  . Years of education: Not on file  . Highest education level: Not on file  Occupational History  . Not on file  Tobacco Use  . Smoking status: Never Smoker  . Smokeless tobacco: Never Used  Vaping Use  . Vaping Use: Never used  Substance and Sexual Activity  . Alcohol use: Yes    Comment: occ  . Drug use: Never  . Sexual activity: Not on file  Other Topics Concern  . Not on file  Social History Narrative  . Not on file   Social Determinants of Health   Financial Resource Strain: Not on file  Food Insecurity: Not on file  Transportation Needs: Not on file  Physical Activity: Not on file  Stress: Not on file  Social Connections: Not on file     Family History: The patient's family history includes Cancer in his father; Diabetes in his mother; Heart disease in his father; Hypertension in his mother.  ROS:   All other ROS  reviewed and negative. Pertinent positives noted in the HPI.     EKGs/Labs/Other Studies Reviewed:   The following studies were personally reviewed by me today:  Recent Labs: 08/23/2019: BNP 40.6; TSH 0.934 06/21/2020: ALT 19; BUN 23; Creatinine, Ser 1.69; Hemoglobin 14.4; Platelets 213; Potassium 4.1; Sodium 139   Recent Lipid Panel    Component Value Date/Time   CHOL 199 06/22/2020 0255   TRIG 153 (H) 06/22/2020 0255   HDL 32 (L) 06/22/2020 0255   CHOLHDL 6.2 06/22/2020 0255   VLDL 31 06/22/2020 0255   LDLCALC 136 (H) 06/22/2020 0255    Physical Exam:   VS:  BP 94/60   Pulse 74   Ht 5\' 10"  (1.778 m)   Wt 205 lb 12.8 oz (93.4 kg)   SpO2 98%   BMI 29.53 kg/m    Wt Readings from Last 3 Encounters:  08/21/20 205 lb 12.8 oz (93.4 kg)  06/21/20 223 lb 12.8 oz (101.5 kg)  02/18/20 215 lb 9.6 oz (97.8 kg)    General: Well nourished, well developed, in no acute distress Head: Atraumatic, normal size  Eyes: PEERLA, EOMI  Neck: Supple, no JVD Endocrine: No thryomegaly Cardiac: Normal S1, S2; RRR; no murmurs, rubs, or gallops Lungs: Clear to auscultation bilaterally, no wheezing, rhonchi or rales  Abd: Soft, nontender, no hepatomegaly  Ext: No edema, pulses 2+ Musculoskeletal: No deformities, BUE and BLE strength normal and equal Skin: Warm and dry, no rashes   Neuro: Alert and oriented to person, place, time, and situation, CNII-XII grossly intact, no focal deficits  Psych: Normal mood and affect   ASSESSMENT:   Logan Harrison is a 65 y.o. male who presents for the following: 1. Chronic systolic heart failure ([place])   2. LBBB (left bundle branch block)   3. OSA (obstructive sleep apnea)   4. Mixed hyperlipidemia     PLAN:   1. Chronic systolic heart failure ([place]) 2. LBBB (left bundle branch block) -Diagnosed with systolic heart failure EF 30-35% April 2021 in setting of COVID-19 pneumonia.  EF has recovered up to 60% with guideline directed medical therapy.   Echocardiogram last [facility] at [facility] with EF of 55%. -Euvolemic on examination.  No symptoms of heart failure.  BP is a bit low however he reports he has no symptoms.  Would recommend to continue his current heart failure regimen which includes Coreg 6.25 mg twice daily, Entresto 49-51 twice daily, Aldactone 12.5 mg daily.  He takes Lasix as needed.  3. OSA (obstructive sleep apnea) -Moderate sleep apnea with nocturnal hypoxemia.  In lab sleep study was never completed.  We will reach out to sleep medicine about this.  4. Mixed hyperlipidemia -Not on a statin.  Continue  to monitor.  Disposition: Return in about 1 year (around 08/21/2021).  Medication Adjustments/Labs and Tests Ordered: Current medicines are reviewed at length with the patient today.  Concerns regarding medicines are outlined above.  No orders of the defined types were placed in this encounter.  Meds ordered this encounter  Medications  . spironolactone (ALDACTONE) 25 MG tablet    Sig: Take 0.5 tablets (12.5 mg total) by mouth daily.    Dispense:  90 tablet    Refill:  1  . sacubitril-valsartan (ENTRESTO) 49-51 MG    Sig: Take 1 tablet by mouth 2 (two) times daily.    Dispense:  60 tablet  . carvedilol (COREG) 6.25 MG tablet    Sig: Take 1 tablet (6.25 mg total) by mouth 2 (two) times daily.    Dispense:  180 tablet    Refill:  3    Patient Instructions  Medication Instructions:  The current medical regimen is effective;  continue present plan and medications.  *If you need a refill on your cardiac medications before your next appointment, please call your pharmacy*   Follow-Up: At [facility], you and your health needs are our priority.  As part of our continuing mission to provide you with exceptional heart care, we have created designated Provider Care Teams.  These Care Teams include your primary Cardiologist (physician) and Advanced Practice Providers (APPs -  Physician Assistants and Nurse  Practitioners) who all work together to provide you with the care you need, when you need it.  We recommend signing up for the patient portal called "MyChart".  Sign up information is provided on this After Visit Summary.  MyChart is used to connect with patients for Virtual Visits (Telemedicine).  Patients are able to view lab/test results, encounter notes, upcoming appointments, etc.  Non-urgent messages can be sent to your provider as well.   To learn more about what you can do with MyChart, go to [email or web address].    Your next appointment:   12 month(s)  The format for your next appointment:   In Person  Provider:   You may see Evalina Field, MD or one of the following Advanced Practice Providers on your designated Care Team:    Almyra Deforest, PA-C  Fabian Sharp, [place] or   Roby Lofts, PA-C       Time Spent with Patient: I have spent a total of 25 minutes with patient reviewing hospital notes, telemetry, EKGs, labs and examining the patient as well as establishing an assessment and plan that was discussed with the patient.  > 50% of time was spent in direct patient care.  Signed, Addison Naegeli. Audie Box, MD, [place]  [address], [place], [place] [address] [phone]  08/21/2020 8:14 AM

## 2020-08-22 ENCOUNTER — Telehealth: Payer: Self-pay | Admitting: *Deleted

## 2020-08-22 NOTE — Telephone Encounter (Signed)
Left message to return a call to me to discuss his CPAP machine.

## 2020-08-26 ENCOUNTER — Telehealth: Payer: Self-pay

## 2020-08-26 NOTE — Telephone Encounter (Signed)
I called patient and updated him on the process for his machine.  Patient verbalized understanding.  Thankful for call back.

## 2020-08-26 NOTE — Telephone Encounter (Signed)
-----   Message from Geralynn Rile, MD sent at 08/23/2020  4:12 PM EDT ----- Thanks Mariann Laster.   Barbee Mamula-> Can you update Mr. Grantz.  [place] T. Audie Box, MD, [place]  [address], [place], [place] [address] [phone]  4:12 PM  ----- Message ----- From: Lauralee Evener, CMA Sent: 08/22/2020   9:39 AM EDT To: Geralynn Rile, MD, Caprice Beaver, LPN  His titration was denied and APAP was ordered. On Jan 18th. The machines are still in short supply. They are filling the orders in the order they received them. The December orders have just been done. Sorry. :( ----- Message ----- From: Caprice Beaver, LPN Sent: 07/23/2861   8:15 AM EDT To: Lauralee Evener, CMA  Geronimo Boot,  Can you check in on this patient- Dr.Kelly had suggested to do a in lab cpap titration, but patient seen Dr.O'Neal and has not heard anything on this.   Anyway to take a look, thank you!

## 2020-09-07 NOTE — Progress Notes (Deleted)
Cardiology Office Note:   Date:  09/07/2020  NAME:  Logan Harrison    MRN: [id number] DOB:  [birth year]   PCP:  [facility]  Cardiologist:  Evalina Field, MD  Electrophysiologist:  None   Referring MD: [facility]*   No chief complaint on file. ***  History of Present Illness:   Logan Harrison is a 65 y.o. male with a hx of systolic HF with recovery of EF, LBBB, DVT, Metastatic melanoma who presents for follow-up.   Problem List 1. Systolic HF -04/3242 -EF 01-02%-> 60-65% (11/27/2019) -NM stress with LBBB artifact 2. LBBB -QRS 160 ms -QRS 170 (2014-2019 per old EKGs) 3. DVT  -2/2 covid PNA(07/2019) 4. Prostate CA 5. HLD 6. OSA -moderate 7. 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 8. Metastatic melanoma  -eye/bone/brain? -on nivolumab   Past Medical History: Past Medical History:  Diagnosis Date  . CHF (congestive heart failure) ([place])   . Hyperlipidemia   . Hypertension   . ICH (intracerebral hemorrhage) ([place])   . LBBB (left bundle branch block)   . Melanoma ([place])    Metastatic  . Prostate cancer [facility])     Past Surgical History: Past Surgical History:  Procedure Laterality Date  . APPENDECTOMY    . CHOLECYSTECTOMY    . PROSTATECTOMY      Current Medications: No outpatient medications have been marked as taking for the 09/08/20 encounter (Appointment) with O'Neal, Cassie Freer, MD.     Allergies:    Patient has no known allergies.   Social History: Social History   Socioeconomic History  . Marital status: Single    Spouse name: Not on file  . Number of children: Not on file  . Years of education: Not on file  . Highest education level: Not on file  Occupational History  . Not on file  Tobacco Use  . Smoking status: Never Smoker  . Smokeless tobacco: Never Used  Vaping Use  . Vaping Use: Never used  Substance and Sexual Activity  . Alcohol use: Yes    Comment: occ  . Drug use: Never  . Sexual activity:  Not on file  Other Topics Concern  . Not on file  Social History Narrative  . Not on file   Social Determinants of Health   Financial Resource Strain: Not on file  Food Insecurity: Not on file  Transportation Needs: Not on file  Physical Activity: Not on file  Stress: Not on file  Social Connections: Not on file     Family History: The patient's ***family history includes Cancer in his father; Diabetes in his mother; Heart disease in his father; Hypertension in his mother.  ROS:   All other ROS reviewed and negative. Pertinent positives noted in the HPI.     EKGs/Labs/Other Studies Reviewed:   The following studies were personally reviewed by me today:  EKG:  EKG is *** ordered today.  The ekg ordered today demonstrates ***, and was personally reviewed by me.   Recent Labs: 06/21/2020: ALT 19; BUN 23; Creatinine, Ser 1.69; Hemoglobin 14.4; Platelets 213; Potassium 4.1; Sodium 139   Recent Lipid Panel    Component Value Date/Time   CHOL 199 06/22/2020 0255   TRIG 153 (H) 06/22/2020 0255   HDL 32 (L) 06/22/2020 0255   CHOLHDL 6.2 06/22/2020 0255   VLDL 31 06/22/2020 0255   LDLCALC 136 (H) 06/22/2020 0255    Physical Exam:   VS:  There were no vitals  taken for this visit.   Wt Readings from Last 3 Encounters:  08/21/20 205 lb 12.8 oz (93.4 kg)  06/21/20 223 lb 12.8 oz (101.5 kg)  02/18/20 215 lb 9.6 oz (97.8 kg)    General: Well nourished, well developed, in no acute distress Head: Atraumatic, normal size  Eyes: PEERLA, EOMI  Neck: Supple, no JVD Endocrine: No thryomegaly Cardiac: Normal S1, S2; RRR; no murmurs, rubs, or gallops Lungs: Clear to auscultation bilaterally, no wheezing, rhonchi or rales  Abd: Soft, nontender, no hepatomegaly  Ext: No edema, pulses 2+ Musculoskeletal: No deformities, BUE and BLE strength normal and equal Skin: Warm and dry, no rashes   Neuro: Alert and oriented to person, place, time, and situation, CNII-XII grossly intact, no focal  deficits  Psych: Normal mood and affect   ASSESSMENT:   [name] is a 65 y.o. male who presents for the following: No diagnosis found.  PLAN:   There are no diagnoses linked to this encounter.  Disposition: No follow-ups on file.  Medication Adjustments/Labs and Tests Ordered: Current medicines are reviewed at length with the patient today.  Concerns regarding medicines are outlined above.  No orders of the defined types were placed in this encounter.  No orders of the defined types were placed in this encounter.   There are no Patient Instructions on file for this visit.   Time Spent with Patient: I have spent a total of *** minutes with patient reviewing hospital notes, telemetry, EKGs, labs and examining the patient as well as establishing an assessment and plan that was discussed with the patient.  > 50% of time was spent in direct patient care.  Signed, Addison Naegeli. Audie Box, MD, [place]  [address] [place], [place] [address] [phone]  09/07/2020 8:39 PM

## 2020-09-08 ENCOUNTER — Ambulatory Visit: Payer: Managed Care, Other (non HMO) | Admitting: Cardiovascular Disease

## 2020-09-08 DIAGNOSIS — I5022 Chronic systolic (congestive) heart failure: Secondary | ICD-10-CM

## 2020-09-08 DIAGNOSIS — I447 Left bundle-branch block, unspecified: Secondary | ICD-10-CM

## 2020-09-08 DIAGNOSIS — G4733 Obstructive sleep apnea (adult) (pediatric): Secondary | ICD-10-CM

## 2020-09-08 DIAGNOSIS — E782 Mixed hyperlipidemia: Secondary | ICD-10-CM

## 2020-09-10 ENCOUNTER — Telehealth: Payer: Self-pay | Admitting: Internal Medicine

## 2020-09-10 MED ORDER — SACUBITRIL-VALSARTAN 24-26 MG PO TABS: 1.0000 | ORAL_TABLET | Freq: Two times a day (BID) | ORAL | 3 refills | Status: DC

## 2020-09-10 NOTE — Telephone Encounter (Signed)
   Call received from close friend of the patient, Charlaine Dalton, regarding low blood pressure today and now low heart rate.   He has heart failure with recovered EF and LBBB, as well as metastatic melanoma with CNS involvement. His blood pressure has been running a little lower recently and he was instructed a couple of days ago to cut down his Entresto to 24/26mg  - started this lower dose this morning. BP last night was 92/57 and this morning 83/52 with HR in the 80s. He received a chemo infusion today, which Pam states he has tolerated well previously. However, tonight his blood pressure is 72/40 with a heart rate of 40. The bradycardia is new for him as well. He is on carvedilol 6.25mg  but no recent changes. He is feeling lightheaded but no other symptoms such as dyspnea, chest pain, fever, chills, diaphoresis, rashes, nausea/vomiting, diarrhea. As we are discussing need for further evaluation, particularly given his known conduction disease, she says his blood pressure is now 62/35 with a heart rate of 39. I instructed her to keep him supine and call 911 immediately for EMS transport. She is in agreement and will call now.   Caller verbalized understanding and was grateful for the call back.  Marykay Lex, MD 09/10/2020, 11:38 PM

## 2020-09-11 ENCOUNTER — Encounter (HOSPITAL_COMMUNITY): Payer: Self-pay | Admitting: Emergency Medicine

## 2020-09-11 ENCOUNTER — Other Ambulatory Visit: Payer: Self-pay

## 2020-09-11 ENCOUNTER — Inpatient Hospital Stay (HOSPITAL_COMMUNITY): Payer: Managed Care, Other (non HMO)

## 2020-09-11 ENCOUNTER — Emergency Department (HOSPITAL_COMMUNITY): Payer: Managed Care, Other (non HMO)

## 2020-09-11 ENCOUNTER — Inpatient Hospital Stay (HOSPITAL_COMMUNITY)
Admission: EM | Admit: 2020-09-11 | Discharge: 2020-09-13 | DRG: 243 | Disposition: A | Discharge status: Home / Self Care | Payer: Managed Care, Other (non HMO) | Attending: Cardiology | Admitting: Cardiology

## 2020-09-11 DIAGNOSIS — Z833 Family history of diabetes mellitus: Secondary | ICD-10-CM | POA: Diagnosis not present

## 2020-09-11 DIAGNOSIS — C439 Malignant melanoma of skin, unspecified: Secondary | ICD-10-CM | POA: Diagnosis present

## 2020-09-11 DIAGNOSIS — I5022 Chronic systolic (congestive) heart failure: Secondary | ICD-10-CM | POA: Diagnosis present

## 2020-09-11 DIAGNOSIS — Z20822 Contact with and (suspected) exposure to covid-19: Secondary | ICD-10-CM | POA: Diagnosis present

## 2020-09-11 DIAGNOSIS — E785 Hyperlipidemia, unspecified: Secondary | ICD-10-CM | POA: Diagnosis present

## 2020-09-11 DIAGNOSIS — I442 Atrioventricular block, complete: Secondary | ICD-10-CM

## 2020-09-11 DIAGNOSIS — Z9079 Acquired absence of other genital organ(s): Secondary | ICD-10-CM

## 2020-09-11 DIAGNOSIS — N179 Acute kidney failure, unspecified: Secondary | ICD-10-CM | POA: Diagnosis present

## 2020-09-11 DIAGNOSIS — Z79899 Other long term (current) drug therapy: Secondary | ICD-10-CM

## 2020-09-11 DIAGNOSIS — C7931 Secondary malignant neoplasm of brain: Secondary | ICD-10-CM | POA: Diagnosis present

## 2020-09-11 DIAGNOSIS — G4733 Obstructive sleep apnea (adult) (pediatric): Secondary | ICD-10-CM | POA: Diagnosis present

## 2020-09-11 DIAGNOSIS — Z86711 Personal history of pulmonary embolism: Secondary | ICD-10-CM

## 2020-09-11 DIAGNOSIS — Z8249 Family history of ischemic heart disease and other diseases of the circulatory system: Secondary | ICD-10-CM | POA: Diagnosis not present

## 2020-09-11 DIAGNOSIS — Z809 Family history of malignant neoplasm, unspecified: Secondary | ICD-10-CM

## 2020-09-11 DIAGNOSIS — R778 Other specified abnormalities of plasma proteins: Secondary | ICD-10-CM

## 2020-09-11 DIAGNOSIS — I130 Hypertensive heart and chronic kidney disease with heart failure and stage 1 through stage 4 chronic kidney disease, or unspecified chronic kidney disease: Secondary | ICD-10-CM | POA: Diagnosis present

## 2020-09-11 DIAGNOSIS — Z950 Presence of cardiac pacemaker: Secondary | ICD-10-CM

## 2020-09-11 DIAGNOSIS — I252 Old myocardial infarction: Secondary | ICD-10-CM

## 2020-09-11 DIAGNOSIS — E875 Hyperkalemia: Secondary | ICD-10-CM | POA: Diagnosis present

## 2020-09-11 DIAGNOSIS — Z8616 Personal history of COVID-19: Secondary | ICD-10-CM

## 2020-09-11 DIAGNOSIS — C799 Secondary malignant neoplasm of unspecified site: Secondary | ICD-10-CM | POA: Diagnosis present

## 2020-09-11 DIAGNOSIS — I959 Hypotension, unspecified: Secondary | ICD-10-CM | POA: Diagnosis present

## 2020-09-11 DIAGNOSIS — Z8546 Personal history of malignant neoplasm of prostate: Secondary | ICD-10-CM

## 2020-09-11 DIAGNOSIS — I452 Bifascicular block: Secondary | ICD-10-CM | POA: Diagnosis present

## 2020-09-11 DIAGNOSIS — N1831 Chronic kidney disease, stage 3a: Secondary | ICD-10-CM | POA: Diagnosis present

## 2020-09-11 DIAGNOSIS — I509 Heart failure, unspecified: Secondary | ICD-10-CM

## 2020-09-11 HISTORY — DX: Malignant melanoma of skin, unspecified: C43.9

## 2020-09-11 HISTORY — DX: Secondary malignant neoplasm of unspecified site: C79.9

## 2020-09-11 LAB — COMPREHENSIVE METABOLIC PANEL
ALT: 13 U/L (ref 0–44)
AST: 45 U/L — ABNORMAL HIGH (ref 15–41)
Albumin: 3.2 g/dL — ABNORMAL LOW (ref 3.5–5.0)
Alkaline Phosphatase: 52 U/L (ref 38–126)
Anion gap: 7 (ref 5–15)
BUN: 20 mg/dL (ref 8–23)
CO2: 22 mmol/L (ref 22–32)
Calcium: 8.6 mg/dL — ABNORMAL LOW (ref 8.9–10.3)
Chloride: 103 mmol/L (ref 98–111)
Creatinine, Ser: 1.46 mg/dL — ABNORMAL HIGH (ref 0.61–1.24)
GFR, Estimated: 53 mL/min — ABNORMAL LOW (ref >60)
Glucose, Bld: 121 mg/dL — ABNORMAL HIGH (ref 70–99)
Potassium: 5.7 mmol/L — ABNORMAL HIGH (ref 3.5–5.1)
Sodium: 132 mmol/L — ABNORMAL LOW (ref 135–145)
Total Bilirubin: 0.9 mg/dL (ref 0.3–1.2)
Total Protein: 5.2 g/dL — ABNORMAL LOW (ref 6.5–8.1)

## 2020-09-11 LAB — I-STAT CHEM 8, ED
BUN: 28 mg/dL — ABNORMAL HIGH (ref 8–23)
Calcium, Ion: 1.07 mmol/L — ABNORMAL LOW (ref 1.15–1.40)
Chloride: 106 mmol/L (ref 98–111)
Creatinine, Ser: 1.4 mg/dL — ABNORMAL HIGH (ref 0.61–1.24)
Glucose, Bld: 115 mg/dL — ABNORMAL HIGH (ref 70–99)
HCT: 35 % — ABNORMAL LOW (ref 39.0–52.0)
Hemoglobin: 11.9 g/dL — ABNORMAL LOW (ref 13.0–17.0)
Potassium: 5.9 mmol/L — ABNORMAL HIGH (ref 3.5–5.1)
Sodium: 135 mmol/L (ref 135–145)
TCO2: 22 mmol/L (ref 22–32)

## 2020-09-11 LAB — ECHOCARDIOGRAM COMPLETE
AR max vel: 2.78 cm2
AV Area VTI: 3.09 cm2
AV Area mean vel: 2.71 cm2
AV Mean grad: 4 mmHg
AV Peak grad: 10.5 mmHg
Ao pk vel: 1.62 m/s
Height: 70 in
MV VTI: 2.31 cm2
S' Lateral: 3.1 cm
Weight: 3280 oz

## 2020-09-11 LAB — MAGNESIUM
Magnesium: 2.3 mg/dL (ref 1.7–2.4)
Magnesium: 2.2 mg/dL (ref 1.7–2.4)

## 2020-09-11 LAB — TROPONIN I (HIGH SENSITIVITY): Troponin I (High Sensitivity): 436 ng/L (ref <18)

## 2020-09-11 LAB — TSH: TSH: 1.713 u[IU]/mL (ref 0.350–4.500)

## 2020-09-11 LAB — CBC WITH DIFFERENTIAL/PLATELET
Abs Immature Granulocytes: 0.03 10*3/uL (ref 0.00–0.07)
Basophils Absolute: 0.1 10*3/uL (ref 0.0–0.1)
Basophils Relative: 1 %
Eosinophils Absolute: 1.1 10*3/uL — ABNORMAL HIGH (ref 0.0–0.5)
Eosinophils Relative: 11 %
HCT: 37.5 % — ABNORMAL LOW (ref 39.0–52.0)
Hemoglobin: 12.4 g/dL — ABNORMAL LOW (ref 13.0–17.0)
Immature Granulocytes: 0 %
Lymphocytes Relative: 32 %
Lymphs Abs: 3.2 10*3/uL (ref 0.7–4.0)
MCH: 31.6 pg (ref 26.0–34.0)
MCHC: 33.1 g/dL (ref 30.0–36.0)
MCV: 95.7 fL (ref 80.0–100.0)
Monocytes Absolute: 1 10*3/uL (ref 0.1–1.0)
Monocytes Relative: 10 %
Neutro Abs: 4.8 10*3/uL (ref 1.7–7.7)
Neutrophils Relative %: 46 %
Platelets: 184 10*3/uL (ref 150–400)
RBC: 3.92 MIL/uL — ABNORMAL LOW (ref 4.22–5.81)
RDW: 13.1 % (ref 11.5–15.5)
WBC: 10.3 10*3/uL (ref 4.0–10.5)
nRBC: 0 % (ref 0.0–0.2)

## 2020-09-11 LAB — RESP PANEL BY RT-PCR (FLU A&B, COVID) ARPGX2
Influenza A by PCR: NEGATIVE
Influenza B by PCR: NEGATIVE
SARS Coronavirus 2 by RT PCR: NEGATIVE

## 2020-09-11 LAB — MRSA PCR SCREENING: MRSA by PCR: NEGATIVE

## 2020-09-11 LAB — POTASSIUM: Potassium: 4.5 mmol/L (ref 3.5–5.1)

## 2020-09-11 IMAGING — MR MR HEAD WO/W CM
14 of 16 series · 40 of 48 positions shown · IV contrast (Gadavist)
Comparison: [DATE].

CLINICAL DATA: Metastatic melanoma.

EXAM:
MRI HEAD WITHOUT AND WITH CONTRAST
TECHNIQUE: Multiplanar, multiecho pulse sequences of the brain and surrounding
structures were obtained without and with intravenous contrast.
CONTRAST:  9mL GADAVIST GADOBUTROL 1 MMOL/ML IV SOLN

[Series 5: DWI · axial · 3.0mm · 0.88mm/px · z∈[-82,+69]mm · 6 of 104 slices shown (1 of 4)]
[im 1/104]
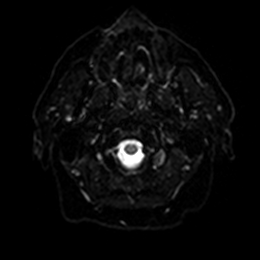
[im 21/104]
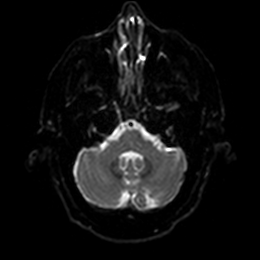
[im 42/104]
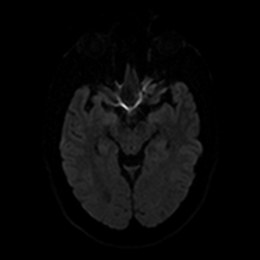
[im 62/104]
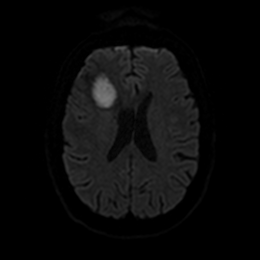
[im 83/104]
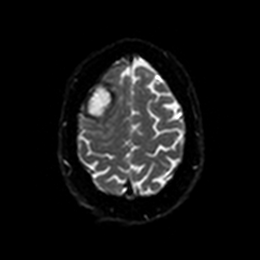
[im 104/104]
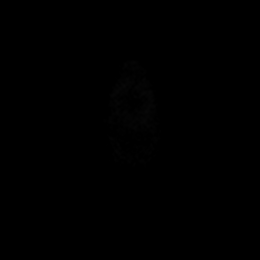

[Series 6: DWI · axial · 3.0mm · 0.88mm/px · z∈[-82,+69]mm · 2 of 52 slices shown (2 of 4)]
[im 1/52]
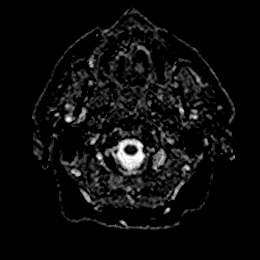
[im 52/52]
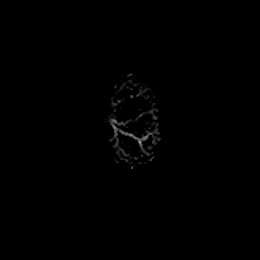

[Series 7: DWI · coronal · 4.0mm · 0.88mm/px · 4 of 72 slices shown (3 of 4)]
[im 1/72]
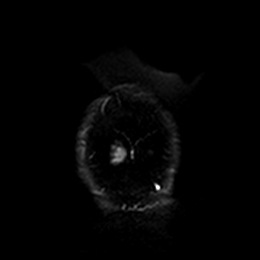
[im 24/72]
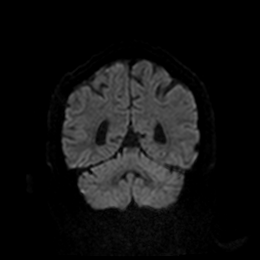
[im 48/72]
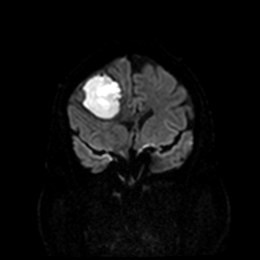
[im 72/72]
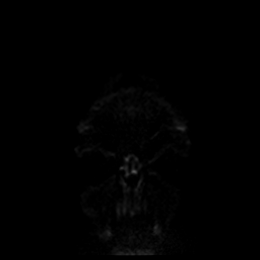

[Series 8: DWI · coronal · 4.0mm · 0.88mm/px · 1 of 36 slices shown (4 of 4)]
[im 1/36]
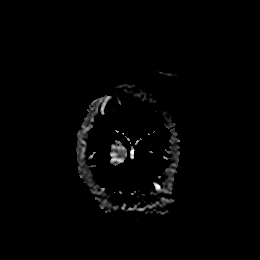

[Series 9: T1 · sagittal · 5.0mm · 0.75mm/px · 2 of 25 slices shown]
[im 1/25]
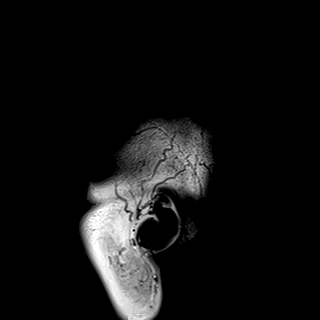
[im 25/25]
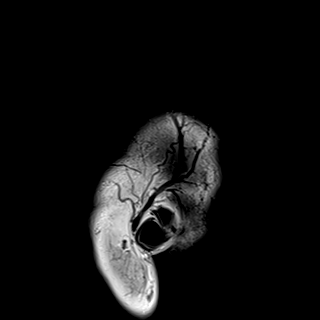

[Series 10: T2 · axial · 5.0mm · 0.72mm/px · z∈[-81,+67]mm · 2 of 26 slices shown]
[im 1/26]
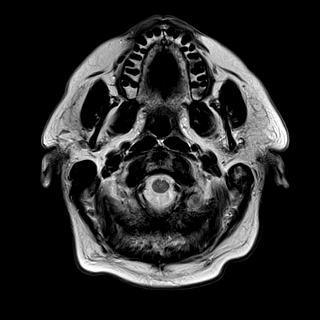
[im 26/26]
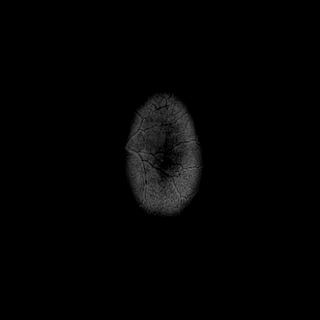

[Series 11: FLAIR · axial · 5.0mm · 0.45mm/px · z∈[-83,+65]mm · 2 of 26 slices shown]
[im 1/26]
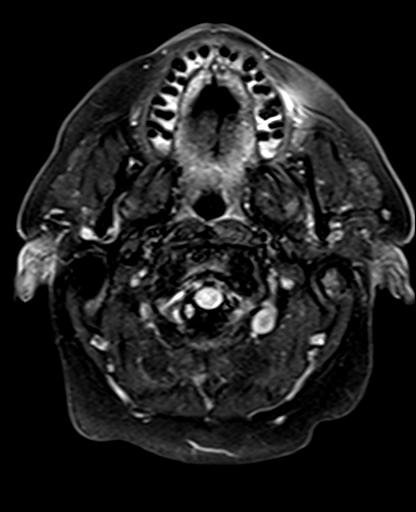
[im 26/26]
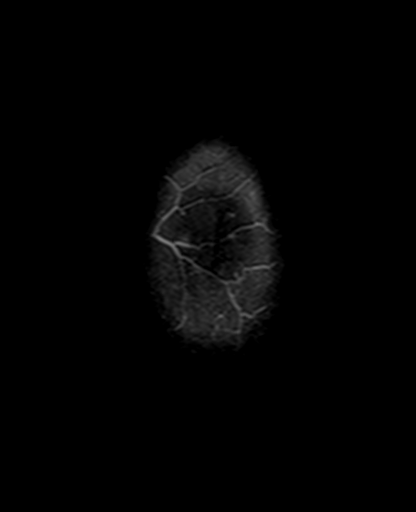

[Series 12: mag_images · axial · 3.0mm · 0.90mm/px · z∈[-91,+72]mm · 4 of 56 slices shown]
[im 1/56]
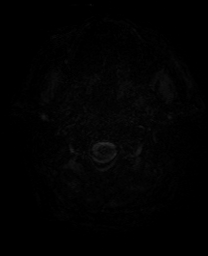
[im 19/56]
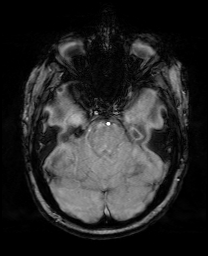
[im 37/56]
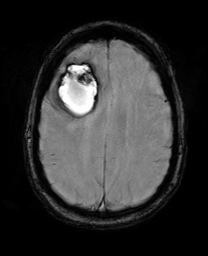
[im 56/56]
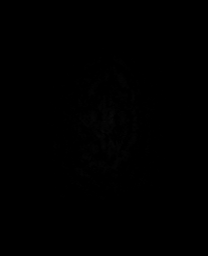

[Series 13: pha_images · axial · 3.0mm · 0.90mm/px · z∈[-91,+72]mm · 4 of 56 slices shown]
[im 1/56]
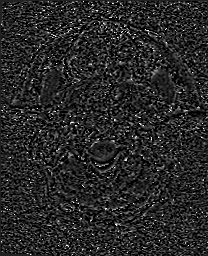
[im 19/56]
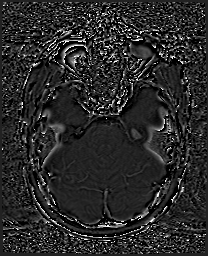
[im 37/56]
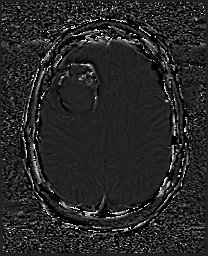
[im 56/56]
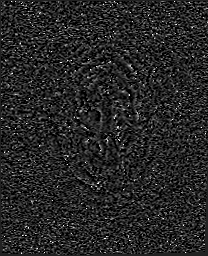

[Series 14: swi_images · axial · 3.0mm · 0.90mm/px · z∈[-91,+72]mm · 4 of 56 slices shown]
[im 1/56]
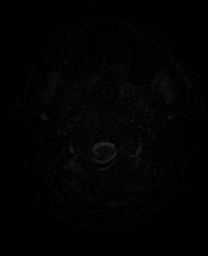
[im 19/56]
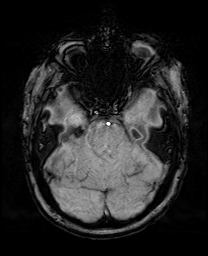
[im 37/56]
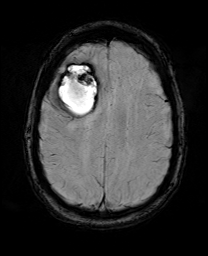
[im 56/56]
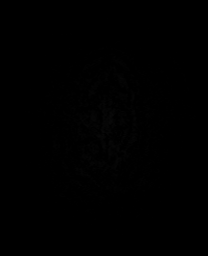

[Series 15: mip_images(sw) · axial · 24.0mm · 0.90mm/px · z∈[-80,+62]mm · 3 of 49 slices shown]
[im 1/49]
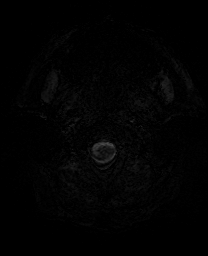
[im 25/49]
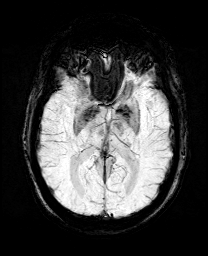
[im 49/49]
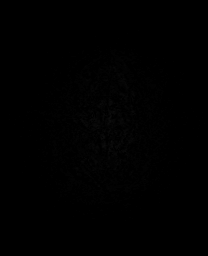

[Series 17: T2 post-contrast · coronal · 5.0mm · 0.72mm/px · 2 of 31 slices shown]
[im 1/31]
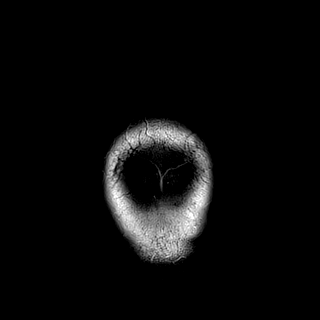
[im 31/31]
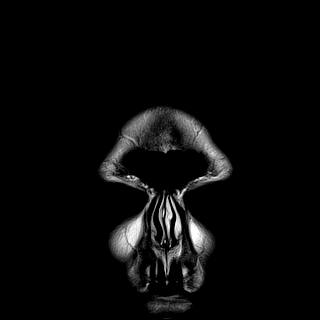

[Series 19: T1 post-contrast · coronal · 5.0mm · 0.34mm/px · 2 of 31 slices shown (1 of 2)]
[im 1/31]
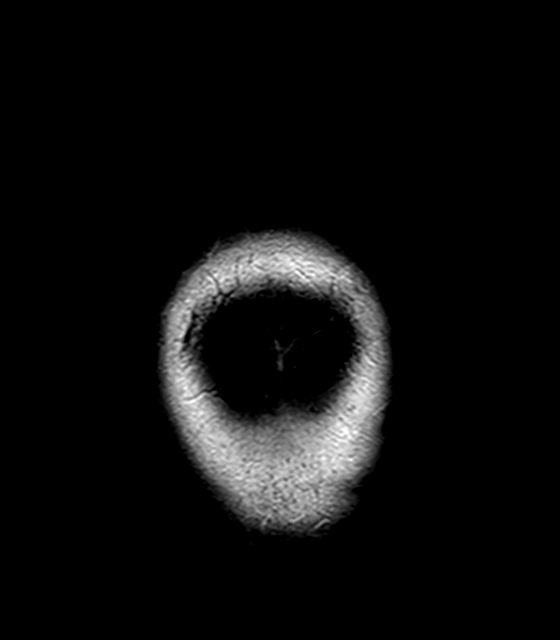
[im 31/31]
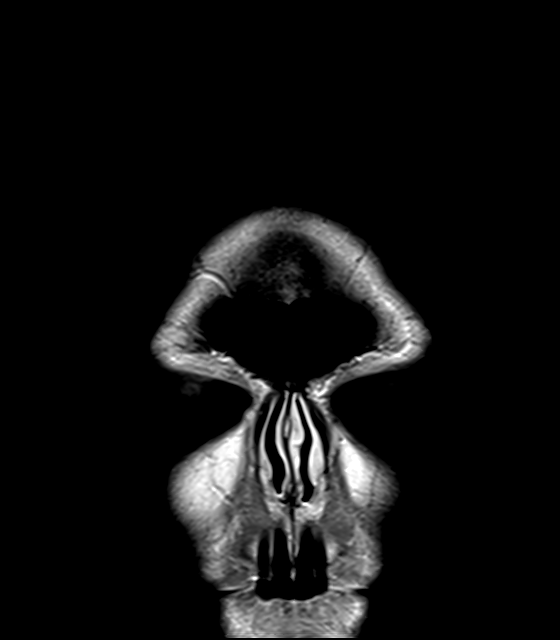

[Series 20: T1 post-contrast · sagittal · 5.0mm · 0.72mm/px · 2 of 25 slices shown (2 of 2)]
[im 1/25]
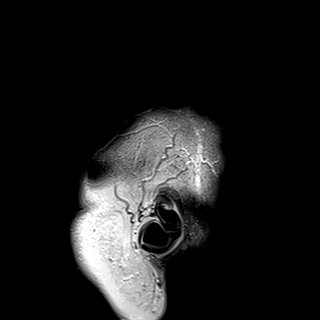
[im 25/25]
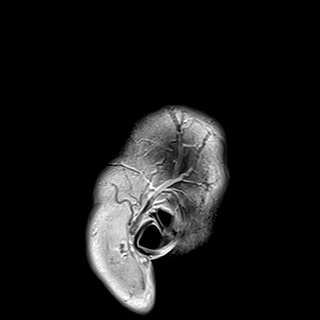

[40 of 48 positions shown; findings below may reference images not displayed]

FINDINGS: Brain: A hemorrhagic mass in the right frontal lobe measures 3.5 x
4.1 x 5.1 cm, increased in size from the prior study. The mass is
largely intrinsically T1 hyperintense which limits assessment for
enhancement, although there is at least mild peripheral enhancement.
There is a more nodular appearance of the anterior portion of the
mass, and there is mild surrounding edema with regional sulcal
effacement, mild mass effect on the frontal horn of the right
lateral ventricle, and 3 mm of leftward midline shift.

Additionally, there are numerous new predominantly punctate nodular
foci of intrinsic T1 hyperintensity along the surface of both
cerebral hemispheres, particularly prominent in the high frontal
regions. These appear to be at least partially intraparenchymal with
superficial cortical involvement, however a component of
leptomeningeal disease is also possible. There is also a new
subcentimeter T1 hyperintense lesion involving the pineal gland.

No acute infarct or extra-axial fluid collection is identified.
Cerebral volume is within normal limits for age.

Vascular: Major intracranial vascular flow voids are preserved.

Skull and upper cervical spine: Unremarkable bone marrow signal.

Sinuses/Orbits: Increased size of a 6 mm T1 hyperintense nodule
anteriorly in the left globe inferior to the lens. New 5 mm T1
hyperintense mass anteriorly in the right globe medial and superior
to the lens. Paranasal sinuses and mastoid air cells are clear.

Other: None.
IMPRESSION: 1. Increased size of right frontal hemorrhagic mass with mild edema
and 3 mm of leftward midline shift.
2. Numerous new punctate lesions along the surface of the brain
consistent with metastases. Some of these appear to be located
superficially within cerebral cortex, however a component of
leptomeningeal disease is also possible.
3. New lesions involving the pineal gland and both ocular globes
consistent with metastases.

## 2020-09-11 IMAGING — DX DG CHEST 1V PORT
1 series · 1 of 1 positions shown · non-contrast
Comparison: Prior radiograph from [DATE].

CLINICAL DATA: Initial evaluation for bradycardia.

EXAM:
PORTABLE CHEST 1 VIEW

[chest ap]
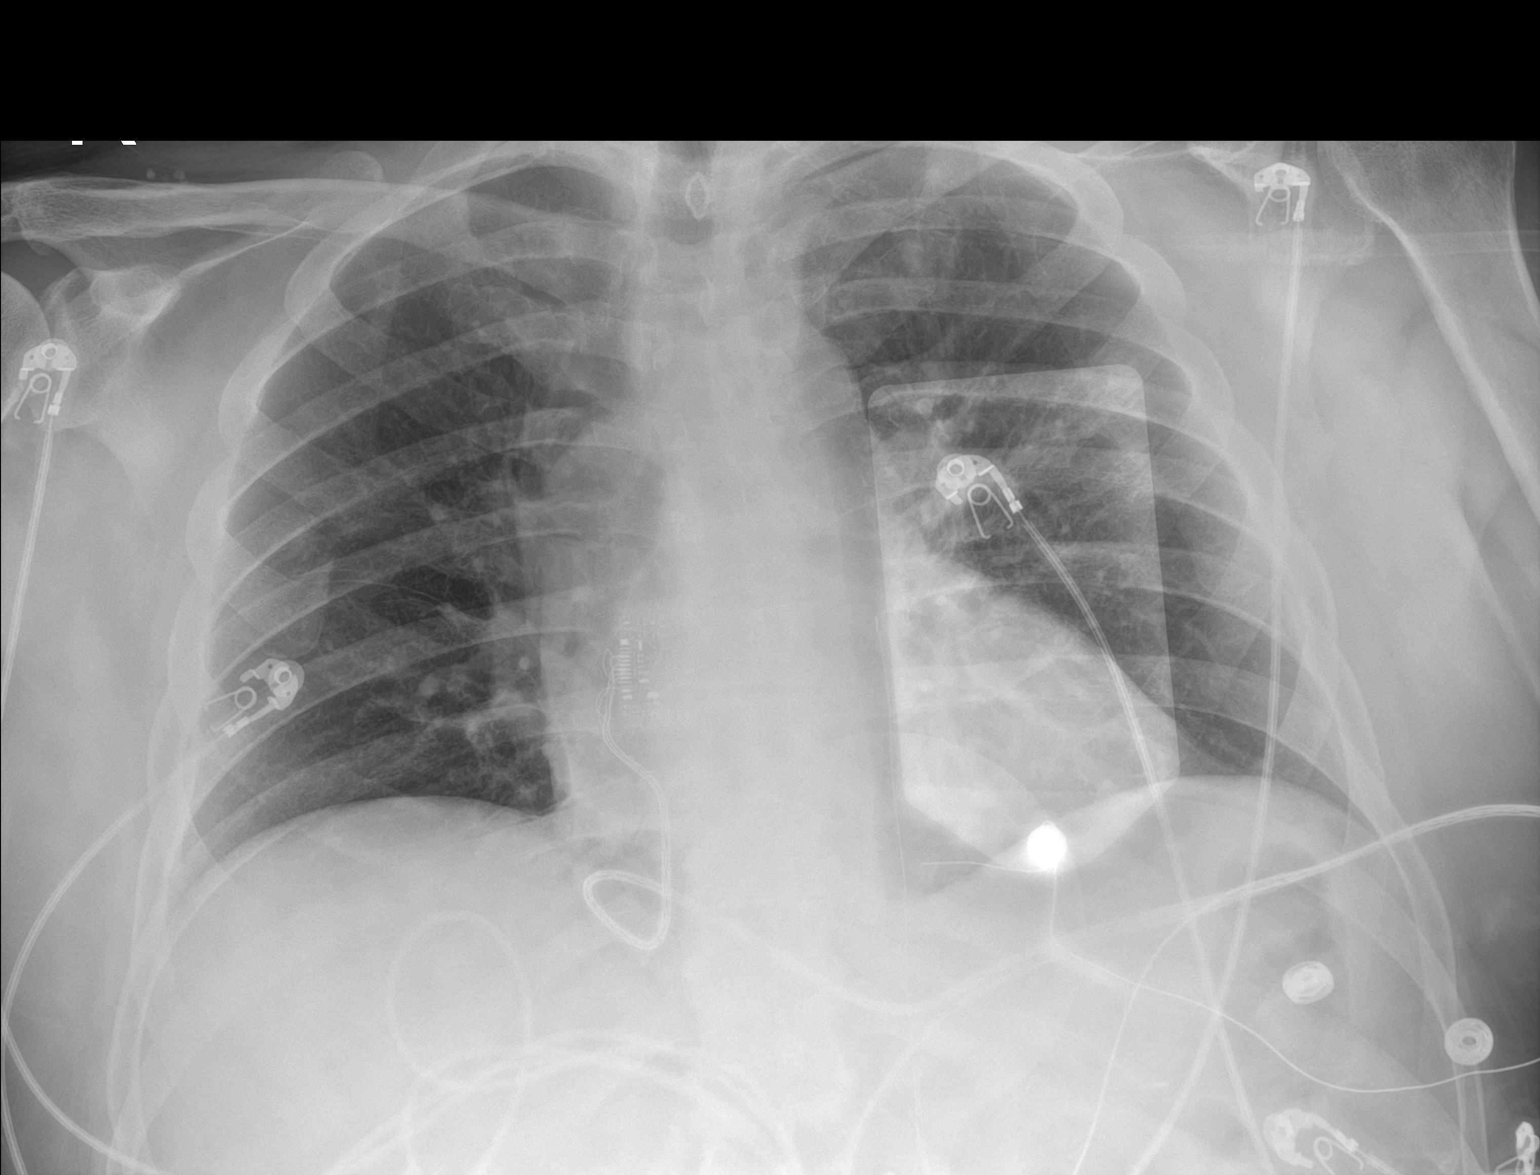

[1 of 1 positions shown; findings below may reference images not displayed]

FINDINGS: Transverse heart size within normal limits. Mediastinal silhouette
normal.

Lungs hypoinflated. Different related pads overlie the left chest.
No focal infiltrates. No edema or effusion. No pneumothorax.

No acute osseous finding.
IMPRESSION: No radiographic evidence for active cardiopulmonary disease.

## 2020-09-11 MED ORDER — CEFAZOLIN SODIUM-DEXTROSE 2-4 GM/100ML-% IV SOLN
2.0000 g | INTRAVENOUS | Status: AC
  Administered 2020-09-12: 2 g via INTRAVENOUS

## 2020-09-11 MED ORDER — ONDANSETRON HCL 4 MG/2ML IJ SOLN
4.0000 mg | Freq: Four times a day (QID) | INTRAMUSCULAR | Status: DC | PRN
  Administered 2020-09-11: 4 mg via INTRAVENOUS
  Filled 2020-09-11: qty 2

## 2020-09-11 MED ORDER — SODIUM CHLORIDE 0.9% FLUSH
3.0000 mL | Freq: Two times a day (BID) | INTRAVENOUS | Status: DC
  Administered 2020-09-11 – 2020-09-13 (×5): 3 mL via INTRAVENOUS

## 2020-09-11 MED ORDER — SODIUM CHLORIDE 0.9 % IV SOLN
80.0000 mg | INTRAVENOUS | Status: DC
  Filled 2020-09-11: qty 2

## 2020-09-11 MED ORDER — CALCIUM GLUCONATE-NACL 2-0.675 GM/100ML-% IV SOLN: 2.0000 g | Freq: Once | INTRAVENOUS | Status: DC

## 2020-09-11 MED ORDER — CALCIUM GLUCONATE-NACL 2-0.675 GM/100ML-% IV SOLN
2.0000 g | Freq: Once | INTRAVENOUS | Status: AC
  Administered 2020-09-11: 2000 mg via INTRAVENOUS
  Filled 2020-09-11: qty 100

## 2020-09-11 MED ORDER — GADOBUTROL 1 MMOL/ML IV SOLN
9.0000 mL | Freq: Once | INTRAVENOUS | Status: AC | PRN
  Administered 2020-09-11: 9 mL via INTRAVENOUS

## 2020-09-11 MED ORDER — PERFLUTREN LIPID MICROSPHERE
1.0000 mL | INTRAVENOUS | Status: AC | PRN
  Administered 2020-09-11: 2 mL via INTRAVENOUS
  Filled 2020-09-11: qty 10

## 2020-09-11 MED ORDER — NEOMYCIN-POLYMYXIN-DEXAMETH 3.5-10000-0.1 OP SUSP
1.0000 [drp] | Freq: Three times a day (TID) | OPHTHALMIC | Status: DC
  Administered 2020-09-11 – 2020-09-13 (×7): 1 [drp] via OPHTHALMIC
  Filled 2020-09-11: qty 5

## 2020-09-11 MED ORDER — CHLORHEXIDINE GLUCONATE CLOTH 2 % EX PADS
6.0000 | MEDICATED_PAD | Freq: Every day | CUTANEOUS | Status: DC
  Administered 2020-09-11 – 2020-09-13 (×3): 6 via TOPICAL

## 2020-09-11 MED ORDER — SODIUM CHLORIDE 0.9% FLUSH: 3.0000 mL | INTRAVENOUS | Status: DC | PRN

## 2020-09-11 MED ORDER — CHLORHEXIDINE GLUCONATE 4 % EX LIQD: 60.0000 mL | Freq: Once | CUTANEOUS | Status: AC

## 2020-09-11 MED ORDER — SODIUM CHLORIDE 0.9 % IV SOLN: INTRAVENOUS | Status: DC

## 2020-09-11 MED ORDER — ACETAMINOPHEN 325 MG PO TABS
650.0000 mg | ORAL_TABLET | ORAL | Status: DC | PRN
  Administered 2020-09-13: 650 mg via ORAL
  Filled 2020-09-11: qty 2

## 2020-09-11 MED ORDER — FUROSEMIDE 10 MG/ML IJ SOLN
30.0000 mg | INTRAMUSCULAR | Status: AC
  Administered 2020-09-11: 30 mg via INTRAVENOUS
  Filled 2020-09-11: qty 4

## 2020-09-11 MED ORDER — CHLORHEXIDINE GLUCONATE 4 % EX LIQD
60.0000 mL | Freq: Once | CUTANEOUS | Status: AC
  Administered 2020-09-12: 4 via TOPICAL

## 2020-09-11 MED ORDER — ALBUTEROL SULFATE (2.5 MG/3ML) 0.083% IN NEBU
2.5000 mg | INHALATION_SOLUTION | RESPIRATORY_TRACT | Status: DC | PRN
Start: 2020-09-11 — End: 2020-09-13

## 2020-09-11 MED ORDER — LACTATED RINGERS IV BOLUS
1000.0000 mL | INTRAVENOUS | Status: AC
  Administered 2020-09-11: 1000 mL via INTRAVENOUS

## 2020-09-11 MED ORDER — PREDNISOLONE ACETATE 1 % OP SUSP
1.0000 [drp] | Freq: Every day | OPHTHALMIC | Status: DC
  Administered 2020-09-11 – 2020-09-13 (×3): 1 [drp] via OPHTHALMIC
  Filled 2020-09-11: qty 5

## 2020-09-11 MED ORDER — SODIUM CHLORIDE 0.9 % IV SOLN: 250.0000 mL | INTRAVENOUS | Status: DC | PRN

## 2020-09-11 MED ORDER — SODIUM CHLORIDE 0.9% FLUSH
3.0000 mL | Freq: Two times a day (BID) | INTRAVENOUS | Status: DC
  Administered 2020-09-11 – 2020-09-13 (×4): 3 mL via INTRAVENOUS

## 2020-09-11 MED ORDER — NEOMYCIN-POLYMYXIN-DEXAMETH 0.1 % OP SUSP: 1.0000 [drp] | Freq: Three times a day (TID) | OPHTHALMIC | Status: DC

## 2020-09-11 MED ORDER — ATROPINE SULFATE 1 % OP SOLN
1.0000 [drp] | Freq: Two times a day (BID) | OPHTHALMIC | Status: DC
  Administered 2020-09-11 – 2020-09-13 (×5): 1 [drp] via OPHTHALMIC
  Filled 2020-09-11: qty 2

## 2020-09-11 NOTE — Plan of Care (Signed)
  Problem: Clinical Measurements: Goal: Respiratory complications will improve Outcome: Progressing Goal: Cardiovascular complication will be avoided Outcome: Progressing   Problem: Nutrition: Goal: Adequate nutrition will be maintained Outcome: Progressing   Problem: Coping: Goal: Level of anxiety will decrease Outcome: Progressing   Problem: Elimination: Goal: Will not experience complications related to urinary retention Outcome: Progressing

## 2020-09-11 NOTE — Progress Notes (Signed)
DAILY PROGRESS NOTE   Patient Name: Logan Harrison Date of Encounter: 09/11/2020 Cardiologist: Evalina Field, MD  Chief Complaint   No complaints  Patient Profile   Logan Harrison is a 65 year old male with a history of melanoma currently on Nivolumab with recent IPH associated with new metastatic brain lesion (planned for resection), prostate adenocarcinoma s/p prostatectomy, CKD, COVID pneumonia 07/2704 complicated by bilateral pulmonary embolisms (completed 6 months Xarelto) and heart failure with subsequent recovery of LVEF, and wide LBBB. He presents to the ED with bradycardia with concern for complete heart block  Subjective   BP improved today - carvedilol being held, however, remains in complete heart block - ventricular rate in the 30's.  Objective   Vitals:   09/11/20 0600 09/11/20 0700 09/11/20 0747 09/11/20 0800  BP: 110/62 100/66  96/70  Pulse: (!) 36 (!) 35  (!) 37  Resp: (!) 21 11  19   Temp:   97.7 F (36.5 C)   TempSrc:   Oral   SpO2: 96% 97%  97%  Weight:      Height:        Intake/Output Summary (Last 24 hours) at 09/11/2020 0901 Last data filed at 09/11/2020 0750 Gross per 24 hour  Intake 2181.1 ml  Output 1850 ml  Net 331.1 ml   Filed Weights   09/11/20 0114  Weight: 93 kg    Physical Exam   General appearance: alert and no distress Neck: no carotid bruit, no JVD and thyroid not enlarged, symmetric, no tenderness/mass/nodules Lungs: clear to auscultation bilaterally Heart: regular bradycardia Abdomen: soft, non-tender; bowel sounds normal; no masses,  no organomegaly Extremities: extremities normal, atraumatic, no cyanosis or edema Pulses: 2+ and symmetric Skin: Skin color, texture, turgor normal. No rashes or lesions Neurologic: Grossly normal Psych: Pleasant  Inpatient Medications    Scheduled Meds: . atropine  1 drop Left Eye BID  . neomycin-polymyxin b-dexamethasone  1 drop Left Eye TID  . prednisoLONE acetate  1 drop Left  Eye Daily  . sodium chloride flush  3 mL Intravenous Q12H    Continuous Infusions: . sodium chloride      PRN Meds: sodium chloride, acetaminophen, albuterol, ondansetron (ZOFRAN) IV, sodium chloride flush   Labs   Results for orders placed or performed during the hospital encounter of 09/11/20 (from the past 48 hour(s))  CBC with Differential/Platelet     Status: Abnormal   Collection Time: 09/11/20 12:50 AM  Result Value Ref Range   WBC 10.3 4.0 - 10.5 K/uL   RBC 3.92 (L) 4.22 - 5.81 MIL/uL   Hemoglobin 12.4 (L) 13.0 - 17.0 g/dL   HCT 37.5 (L) 39.0 - 52.0 %   MCV 95.7 80.0 - 100.0 fL   MCH 31.6 26.0 - 34.0 pg   MCHC 33.1 30.0 - 36.0 g/dL   RDW 13.1 11.5 - 15.5 %   Platelets 184 150 - 400 K/uL   nRBC 0.0 0.0 - 0.2 %   Neutrophils Relative % 46 %   Neutro Abs 4.8 1.7 - 7.7 K/uL   Lymphocytes Relative 32 %   Lymphs Abs 3.2 0.7 - 4.0 K/uL   Monocytes Relative 10 %   Monocytes Absolute 1.0 0.1 - 1.0 K/uL   Eosinophils Relative 11 %   Eosinophils Absolute 1.1 (H) 0.0 - 0.5 K/uL   Basophils Relative 1 %   Basophils Absolute 0.1 0.0 - 0.1 K/uL   Immature Granulocytes 0 %   Abs Immature Granulocytes 0.03  0.00 - 0.07 K/uL    Comment: Performed at [facility], [place] [address], [place], [place] [address]  Comprehensive metabolic panel     Status: Abnormal   Collection Time: 09/11/20 12:50 AM  Result Value Ref Range   Sodium 132 (L) 135 - 145 mmol/L   Potassium 5.7 (H) 3.5 - 5.1 mmol/L   Chloride 103 98 - 111 mmol/L   CO2 22 22 - 32 mmol/L   Glucose, Bld 121 (H) 70 - 99 mg/dL    Comment: Glucose reference range applies only to samples taken after fasting for at least 8 hours.   BUN 20 8 - 23 mg/dL   Creatinine, Ser 1.46 (H) 0.61 - 1.24 mg/dL   Calcium 8.6 (L) 8.9 - 10.3 mg/dL   Total Protein 5.2 (L) 6.5 - 8.1 g/dL   Albumin 3.2 (L) 3.5 - 5.0 g/dL   AST 45 (H) 15 - 41 U/L   ALT 13 0 - 44 U/L   Alkaline Phosphatase 52 38 - 126 U/L   Total Bilirubin 0.9 0.3 - 1.2  mg/dL   GFR, Estimated 53 (L) >60 mL/min    Comment: (NOTE) Calculated using the CKD-EPI Creatinine Equation (2021)    Anion gap 7 5 - 15    Comment: Performed at [facility], [place] [address] [place], [place] [address]  Magnesium     Status: None   Collection Time: 09/11/20 12:50 AM  Result Value Ref Range   Magnesium 2.3 1.7 - 2.4 mg/dL    Comment: Performed at [facility], [place] [address], [place], [place] [address]  Troponin I (High Sensitivity)     Status: Abnormal   Collection Time: 09/11/20 12:50 AM  Result Value Ref Range   Troponin I (High Sensitivity) 436 (HH) <18 ng/L    Comment: CRITICAL RESULT CALLED TO, READ BACK BY AND VERIFIED WITH: Alphonse Guild, RN. [phone] 09/11/20 A. MCDOWELL (NOTE) Elevated high sensitivity troponin I (hsTnI) values and significant  changes across serial measurements may suggest ACS but many other  chronic and acute conditions are known to elevate hsTnI results.  Refer to the Links section for chest pain algorithms and additional  guidance. Performed at [facility], [place] [address], [place], [place] [address]   Resp Panel by RT-PCR (Flu A&B, Covid) Nasopharyngeal Swab     Status: None   Collection Time: 09/11/20 12:50 AM   Specimen: Nasopharyngeal Swab; Nasopharyngeal(NP) swabs in vial transport medium  Result Value Ref Range   SARS Coronavirus 2 by RT PCR NEGATIVE NEGATIVE    Comment: (NOTE) SARS-CoV-2 target nucleic acids are NOT DETECTED.  The SARS-CoV-2 RNA is generally detectable in upper respiratory specimens during the acute phase of infection. The lowest concentration of SARS-CoV-2 viral copies this assay can detect is 138 copies/mL. A negative result does not preclude SARS-Cov-2 infection and should not be used as the sole basis for treatment or other patient management decisions. A negative result may occur with  improper specimen collection/handling, submission of specimen other than nasopharyngeal swab, presence  of viral mutation(s) within the areas targeted by this assay, and inadequate number of viral copies(<138 copies/mL). A negative result must be combined with clinical observations, patient history, and epidemiological information. The expected result is Negative.  Fact Sheet for Patients:  EntrepreneurPulse.com.au  Fact Sheet for Healthcare Providers:  IncredibleEmployment.be  This test is no t yet approved or cleared by the [place] and  has been authorized for  detection and/or diagnosis of SARS-CoV-2 by FDA under an Emergency Use Authorization (EUA). This EUA will remain  in effect (meaning this test can be used) for the duration of the COVID-19 declaration under Section 564(b)(1) of the Act, 21 U.S.C.section 360bbb-3(b)(1), unless the authorization is terminated  or revoked sooner.       Influenza A by PCR NEGATIVE NEGATIVE   Influenza B by PCR NEGATIVE NEGATIVE    Comment: (NOTE) The Xpert Xpress SARS-CoV-2/FLU/RSV plus assay is intended as an aid in the diagnosis of influenza from Nasopharyngeal swab specimens and should not be used as a sole basis for treatment. Nasal washings and aspirates are unacceptable for Xpert Xpress SARS-CoV-2/FLU/RSV testing.  Fact Sheet for Patients: EntrepreneurPulse.com.au  Fact Sheet for Healthcare Providers: IncredibleEmployment.be  This test is not yet approved or cleared by the [place] FDA and has been authorized for detection and/or diagnosis of SARS-CoV-2 by FDA under an Emergency Use Authorization (EUA). This EUA will remain in effect (meaning this test can be used) for the duration of the COVID-19 declaration under Section 564(b)(1) of the Act, 21 U.S.C. section 360bbb-3(b)(1), unless the authorization is terminated or revoked.  Performed at [facility], [place] [address], [place], [place] [address]   I-stat chem 8, ED (not at [facility] or [facility])      Status: Abnormal   Collection Time: 09/11/20  1:04 AM  Result Value Ref Range   Sodium 135 135 - 145 mmol/L   Potassium 5.9 (H) 3.5 - 5.1 mmol/L   Chloride 106 98 - 111 mmol/L   BUN 28 (H) 8 - 23 mg/dL   Creatinine, Ser 1.40 (H) 0.61 - 1.24 mg/dL   Glucose, Bld 115 (H) 70 - 99 mg/dL    Comment: Glucose reference range applies only to samples taken after fasting for at least 8 hours.   Calcium, Ion 1.07 (L) 1.15 - 1.40 mmol/L   TCO2 22 22 - 32 mmol/L   Hemoglobin 11.9 (L) 13.0 - 17.0 g/dL   HCT 35.0 (L) 39.0 - 52.0 %  Magnesium     Status: None   Collection Time: 09/11/20  2:37 AM  Result Value Ref Range   Magnesium 2.2 1.7 - 2.4 mg/dL    Comment: Performed at [facility] [address] [place], [place] [address]  TSH     Status: None   Collection Time: 09/11/20  2:37 AM  Result Value Ref Range   TSH 1.713 0.350 - 4.500 uIU/mL    Comment: Performed by a 3rd Generation assay with a functional sensitivity of <=0.01 uIU/mL. Performed at [facility], [place] [address], [place], [place] [address]   Potassium     Status: None   Collection Time: 09/11/20  2:37 AM  Result Value Ref Range   Potassium 4.5 3.5 - 5.1 mmol/L    Comment: Performed at [facility] [address], [place], [place] [address]  MRSA PCR Screening     Status: None   Collection Time: 09/11/20  3:57 AM   Specimen: Nasopharyngeal  Result Value Ref Range   MRSA by PCR NEGATIVE NEGATIVE    Comment:        The GeneXpert MRSA Assay (FDA approved for NASAL specimens only), is one component of a comprehensive MRSA colonization surveillance program. It is not intended to diagnose MRSA infection nor to guide or monitor treatment for MRSA infections. 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 EXAM: PORTABLE CHEST 1 VIEW COMPARISON:  Prior radiograph from 08/08/2019. FINDINGS: Transverse heart size within normal limits. Mediastinal silhouette normal. Lungs hypoinflated. Different related pads overlie the left chest. No focal infiltrates. No edema or effusion. No pneumothorax. No acute osseous finding. IMPRESSION: No radiographic evidence for active cardiopulmonary disease. Electronically Signed   By: Jeannine Boga M.D.   On: 09/11/2020 01:13    Cardiac Studies   N/A  Assessment   1. Active Problems: 2.   Complete heart block ([place]) 3.   Plan   1. Remains in complete heart block - he has underlying bifascicular block/LBBB, therefore, suspect he will not recover conduction off of carvedilol. EP is aware and may proceed with PPM placement tomorrow. Update limited echo in the setting of recent Opdivo treatment as this could cause a myocarditis, however, I don't see any warnings about heart block with this medicine. Continue to hold HF meds due to hypotension - this has improved. No complaints at this time - Zoll with pacer pads in place at the bedside.  Time Spent Directly with Patient:  I have spent a total of 35 minutes with the patient reviewing hospital notes, telemetry, EKGs, labs and examining the patient as well as establishing an assessment and plan that was discussed personally with the patient.  > 50% of time was spent in direct patient care.  Length of Stay:  LOS: 0 days   Pixie Casino, MD, [facility], [place] Director of the Advanced Lipid Disorders &  Cardiovascular Risk Reduction Clinic Diplomate of the American Board of Clinical Lipidology Attending Cardiologist  Direct Dial: [phone]  Fax: [phone]  Website:  [email or web address] Celestina Gironda 09/11/2020, 9:01 AM

## 2020-09-11 NOTE — ED Triage Notes (Signed)
Pt arrives to ED BIB [facility] due to a 3rd Degree Heart Block. Per EMS patient had a melanoma infusion done yesterday 09/10/2020 and patient began feeling weak and dizzy. Patient's friend checked patient's BP and it was 70/40. Patient's friend called Cardiologsit and was instructed by the cardiologist to come to ED. Per EMS upon their arrival it was noted that patient was in 3rd Degree Heart Block with a HR of 40. Patient denies any SHOB, CP and N/V. Patient is A/O 4. EDP and Cardiologist at bedside upon patient's arrival. Patient placed on cardiac monitor and Zoll Pads.

## 2020-09-11 NOTE — Plan of Care (Signed)

## 2020-09-11 NOTE — Consult Note (Signed)
Cardiology Consultation:   Patient ID: Logan Harrison MRN: [id number]; DOB: 1956-03-11  Admit date: 09/11/2020 Date of Consult: 09/11/2020  PCP:  [facility] Cardiologist:  Evalina Field, MD   {    Patient Profile:   Logan Harrison is a 65 y.o. 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 Debara Pickett.  History of Present Illness:   Logan Harrison last saw Dr. Marisue Ivan 08/21/20, less active since his Ca diagnosis, ICH and chemo, meds continued, had been noted with nocturnal hypoxia during a recent hospital stay, pending or not yet had formal sleep study  Historical echos 07/2019 EF 30-35%-> 60-65% (11/27/2019) Dr. Marisue Ivan nicely wraps up his recent hx 1. Systolic HF -09/2128 -EF 86-57%-> 60-65% (11/27/2019) -NM stress with LBBB artifact 2. LBBB -QRS 160 ms -QRS 170 (2014-2019 per old EKGs) 3. DVT  -2/2 covid PNA(07/2019) 4. Prostate CA 5. HLD 6. OSA -moderate 7. 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 8. Metastatic melanoma  -eye/bone/brain? -on nivolumab   He was admitted overnight/early this AM with symptoms of feeling lightheaded  And home reports of low BPs, he reached out to the office, with ongoing symptoms of lightheadedness and low BPs was referred to the ER/EMS  EMS found him in CHB, SBP 90's, HR 40's No reports of CP, SOB He was admitted his home coreg held and EP consulted.  LABS K+ 5.7 > 5.9 > 4.5 BUN/Creat 28/1.40 Mag 2.2 WBC 10.3 H/H 11/35 Plts 184 TSH 1.713 HS Trop 436  He feels well here this AM at rest, never ha had any CP,  palpitations or cardiac awareness. His wife has been monitoring BP at home BID since April hospitalizations and noted unusually low HR and BP readings that progressively got lower and the patient becoming more weak and unsteady BP's stable here today Remains in CHB   Past Medical History:  Diagnosis Date  . CHF (congestive heart failure) ([place])   . Hyperlipidemia   . Hypertension   . ICH (intracerebral hemorrhage) ([place])   . LBBB (left bundle branch block)   . Melanoma ([place])    Metastatic  . Prostate cancer [facility])     Past Surgical History:  Procedure Laterality Date  . APPENDECTOMY    . CHOLECYSTECTOMY    . PROSTATECTOMY       Home Medications:  Prior to Admission medications   Medication Sig Start Date End Date Taking? Authorizing Provider  atropine 1 % ophthalmic solution Place 1 drop into the left eye in the morning and at bedtime. 08/05/20  Yes [provider]  carvedilol (COREG) 6.25 MG tablet Take 1 tablet (6.25 mg total) by mouth 2 (two) times daily. 08/21/20  Yes O'Neal, Cassie Freer, MD  furosemide (LASIX) 20 MG tablet Take 1 tablet (20 mg total) by mouth daily as needed. Patient taking differently: Take 20 mg by mouth as needed for fluid. 10/16/19 01/14/20 Yes O'Neal, Cassie Freer, MD  neomycin-polymyxin-dexamethasone (MAXITROL) 0.1 % ophthalmic suspension Place 1 drop into the left eye in the morning, at noon, and at bedtime.   Yes [provider]  Nivolumab (OPDIVO IV) Inject into the vein. IV infusion every 4 weeks.   Yes [provider]  prednisoLONE acetate (PRED FORTE) 1 % ophthalmic suspension Place 1 drop into the left eye daily. 07/07/20  Yes [provider]  sacubitril-valsartan (ENTRESTO) 24-26 MG Take 1 tablet by mouth 2 (two) times daily. 09/10/20  Yes O'Neal, Cassie Freer, MD  spironolactone (ALDACTONE) 25 MG tablet Take 0.5 tablets (12.5 mg total) by mouth daily. 08/21/20  Yes O'Neal, Cassie Freer, MD  albuterol (VENTOLIN HFA)  108 (90 Base) MCG/ACT inhaler Inhale 2 puffs into the lungs every 4 (four) hours as needed for wheezing or shortness of breath. 08/13/19   Thurnell Lose, MD  colchicine 0.6 MG tablet Take 0.6 mg by mouth daily as needed (gout flares). 07/25/18   [provider]    Inpatient Medications: Scheduled Meds: . atropine  1 drop Left Eye BID  . neomycin-polymyxin b-dexamethasone  1 drop Left Eye TID  . prednisoLONE acetate  1 drop Left Eye Daily  . sodium chloride flush  3 mL Intravenous Q12H   Continuous Infusions: . sodium chloride     PRN Meds: sodium chloride, acetaminophen, albuterol, ondansetron (ZOFRAN) IV, sodium chloride flush  Allergies:   No Known Allergies  Social History:   Social History   Socioeconomic History  . Marital status: Single    Spouse name: Not on file  . Number of children: Not on file  . Years of education: Not on file  . Highest education level: Not on file  Occupational History  . Not on file  Tobacco Use  . Smoking status: Never Smoker  . Smokeless tobacco: Never Used  Vaping Use  . Vaping Use: Never used  Substance and Sexual Activity  . Alcohol use: Yes    Comment: occ  . Drug use: Never  . Sexual activity: Not on file  Other Topics Concern  . Not on file  Social History Narrative  . Not on file   Social Determinants of Health   Financial Resource Strain: Not on file  Food Insecurity: Not on file  Transportation Needs: Not on file  Physical Activity: Not on file  Stress: Not on file  Social Connections: Not on file  Intimate Partner Violence: Not on file    Family History:   Family History  Problem Relation Age of Onset  . Diabetes Mother   . Hypertension Mother   . Cancer Father   . Heart disease Father      ROS:  Please see the history of present illness.  All other ROS reviewed and negative.     Physical Exam/Data:   Vitals:   09/11/20 0700 09/11/20 0747 09/11/20 0800 09/11/20 0900  BP: 100/66  96/70 (!)  112/51  Pulse: (!) 35  (!) 37 (!) 37  Resp: 11  19 (!) 8  Temp:  97.7 F (36.5 C)    TempSrc:  Oral    SpO2: 97%  97% 98%  Weight:      Height:        Intake/Output Summary (Last 24 hours) at 09/11/2020 1028 Last data filed at 09/11/2020 0750 Gross per 24 hour  Intake 2181.1 ml  Output 1850 ml  Net 331.1 ml   Last 3 Weights 09/11/2020 08/21/2020 06/21/2020  Weight (lbs) 205 lb 205 lb 12.8 oz 223 lb 12.8 oz  Weight (kg) 92.987 kg 93.35 kg 101.515 kg     Body mass index is 29.41 kg/m.  General:  Well nourished,  well developed, in no acute distress HEENT: normal Lymph: no adenopathy Neck: no JVD Endocrine:  No thryomegaly Vascular: No carotid bruits Cardiac:  RRR; bradycardic, no murmurs, gallops or rubs Lungs:  CTA b/l, no wheezing, rhonchi or rales  Abd: soft, nontender Ext: no edema Musculoskeletal:  No deformities Skin: warm and dry  Neuro:  He has baseline Lfacial slight droop/asymmetry, no goross focal abnormalities noted otherwise Psych:  Normal affect   EKG:  The EKG was personally reviewed and demonstrates:    CHB 40bpm, RBBB, LAD  06/22/20: SR 63, LBBB, LAD  Telemetry:  Telemetry was personally reviewed and demonstrates:   CHB 40's  Relevant CV Studies:  06/22/20: TTE IMPRESSIONS  1. Left ventricular ejection fraction, by estimation, is 60 to 65%. The  left ventricle has normal function. The left ventricle has no regional  wall motion abnormalities. Left ventricular diastolic parameters are  consistent with Grade I diastolic  dysfunction (impaired relaxation).  2. Right ventricular systolic function is normal. The right ventricular  size is normal.  3. Left atrial size was moderately dilated.  4. The mitral valve is grossly normal. Trivial mitral valve  regurgitation. No evidence of mitral stenosis.  5. The aortic valve is normal in structure. Aortic valve regurgitation is  not visualized. No aortic stenosis is present.   Comparison(s): No significant  change from prior study.    09/06/19: stress myoview  Nuclear stress EF: 35%.  The left ventricular ejection fraction is moderately decreased (30-44%).  Defect 1: There is a large defect of moderate severity present in the basal anterior, basal anteroseptal, mid anterior, mid anteroseptal, apical anterior, apical septal, apical lateral and apex location.  Defect 2: There is a medium defect of moderate severity present in the basal inferoseptal, basal inferior, mid inferoseptal, mid inferior and apical inferior location.  Findings consistent with prior myocardial infarction.  This is an intermediate risk study. No ischemia   Fixed perfusion defects in anterior, septal, and inferior walls, consistent with prior infarcts.  No ischemia seen.    Laboratory Data:  High Sensitivity Troponin:   Recent Labs  Lab 09/11/20 0050  TROPONINIHS 436*     Chemistry Recent Labs  Lab 09/11/20 0050 09/11/20 0104 09/11/20 0237  NA 132* 135  --   K 5.7* 5.9* 4.5  CL 103 106  --   CO2 22  --   --   GLUCOSE 121* 115*  --   BUN 20 28*  --   CREATININE 1.46* 1.40*  --   CALCIUM 8.6*  --   --   GFRNONAA 53*  --   --   ANIONGAP 7  --   --     Recent Labs  Lab 09/11/20 0050  PROT 5.2*  ALBUMIN 3.2*  AST 45*  ALT 13  ALKPHOS 52  BILITOT 0.9   Hematology Recent Labs  Lab 09/11/20 0050 09/11/20 0104  WBC 10.3  --   RBC 3.92*  --   HGB 12.4* 11.9*  HCT 37.5* 35.0*  MCV 95.7  --   MCH 31.6  --   MCHC 33.1  --   RDW 13.1  --   PLT 184  --    BNPNo results for input(s): BNP, PROBNP in the last 168 hours.  DDimer No results for input(s): DDIMER in the last 168 hours.   Radiology/Studies:  DG Chest Portable 1 View  Result Date: 09/11/2020 CLINICAL DATA:  Initial evaluation for bradycardia. EXAM: PORTABLE CHEST 1 VIEW COMPARISON:  Prior  radiograph from 08/08/2019. FINDINGS: Transverse heart size within normal limits. Mediastinal silhouette normal. Lungs hypoinflated. Different  related pads overlie the left chest. No focal infiltrates. No edema or effusion. No pneumothorax. No acute osseous finding. IMPRESSION: No radiographic evidence for active cardiopulmonary disease. Electronically Signed   By: Jeannine Boga M.D.   On: 09/11/2020 01:13     Assessment and Plan:   1. CHB  He is on coreg (1/2 life 7-10 hours) and held on admission, last dose was approx 1800 last night, unclear but there is mention that Nivolumab may contribute to AV block.  Dr. Quentin Ore has low suspicion that his conduction will recover, has baseline conductions system disease and we will plan for pacing  Echo pending to revisit his EF that has wobbled historically and any evidence of myocarditis. HS Trop 436 with no delta No c/o CP or SOB  We have discussed case with his neurosurgeon's NP Corrinne Eagle. She has requested that we obtain a brain MRI today with/without for them prior to pacer implant for upcoming tumor resection surgery. Dr. Quentin Ore has discussed post pacing and surgical concerns, mostly infection and recommendations for antibiotic prophylaxis   Echo is ordered and will be done today  He is on our schedule tomorrow for PPM,  Will plan to uses an antibioic pouch    Risk Assessment/Risk Scores:  { For questions or updates, please contact [place] Please consult [email or web address] for contact info under    Signed, Baldwin [place], PA-C  09/11/2020 10:28 AM

## 2020-09-11 NOTE — Progress Notes (Signed)
*  PRELIMINARY RESULTS* Echocardiogram 2D Echocardiogram has been performed with Def[facility].  Luisa Hart RDCS 09/11/2020, 1:10 PM

## 2020-09-11 NOTE — H&P (Signed)
Cardiology History & Physical    Patient ID: Logan Harrison MRN: [id number], DOB/AGE: 1955/11/15   Admit date: 09/11/2020  Primary Physician: [facility] Cardiologist: Evalina Field, MD  Patient Profile    Logan Harrison is a 65 year old male with a history of melanoma currently on Nivolumab with recent IPH associated with new metastatic brain lesion (planned for resection), prostate adenocarcinoma s/p prostatectomy, CKD, COVID pneumonia 09/5991 complicated by bilateral pulmonary embolisms (completed 6 months Xarelto) and heart failure with subsequent recovery of LVEF, and wide LBBB. He presents to the ED with bradycardia with concern for complete heart block.   History of Present Illness    Logan Harrison fairly well prior to a recent admission at [facility] in April for intermittent "memory gaps" at which time he was found to have a right frontal IPH associated with vasogenic edema related to likely new metastatic disease. He did not require any acute intervention at the time. Echocardiogram again demonstrated normal LVEF. He has been less active since then, but had not been having any issues with exertional intolerance, dyspnea, lightheadedness/dizziness, syncope. His blood pressure has been a little lower however, 94/60 at his visit with Dr. Audie Box on May 5. He was instructed to decrease his Delene Loll a couple of days ago - he just made the change this morning. BP last night was 92/57 and this morning was 83/52 with HR in the 80s. He went for nivolumab infusion earlier today which he tolerated well. This evening he was more lightheaded, even at rest. His home BP at the time was 72/40 with a drop in HR to 40. He has taken his carvedilol as prescribed, but has not had any recent dose change. He called our triage line and I spoke to his friend Pam. He was still feeling lightheaded but no other symptoms such as dyspnea, chest pain, fever, chills, diaphoresis,  rashes, nausea/vomiting, diarrhea. Repeat BP during the call was down to 62/35 with a heart rate of 39, so I had her call EMS.  During transport his BP remained 90s/50s with a HR of 40 and ECG that showed complete heart block with a QRS around 130 ms, more narrow that his conducted QRS. He has not experienced any further symptoms/lightheadedness since leaving his house. Initial labs in the ED are notable for a potassium of 5.7 (5.9 on repeat) with Cr around baseline at 1.4. iCa is slightly low at 1.07.   Past Medical History   Past Medical History:  Diagnosis Date  . CHF (congestive heart failure) ([place])   . Hyperlipidemia   . Hypertension   . ICH (intracerebral hemorrhage) ([place])   . LBBB (left bundle branch block)   . Melanoma ([place])    Metastatic  . Prostate cancer [facility])     Past Surgical History:  Procedure Laterality Date  . APPENDECTOMY    . CHOLECYSTECTOMY    . PROSTATECTOMY       Allergies No Known Allergies  Home Medications    Prior to Admission medications   Medication Sig Start Date End Date Taking? Authorizing Provider  albuterol (VENTOLIN HFA) 108 (90 Base) MCG/ACT inhaler Inhale 2 puffs into the lungs every 4 (four) hours as needed for wheezing or shortness of breath. 08/13/19   Thurnell Lose, MD  carvedilol (COREG) 6.25 MG tablet Take 1 tablet (6.25 mg total) by mouth 2 (two) times daily. 08/21/20   O'NealCassie Freer, MD  furosemide (LASIX) 20 MG  tablet Take 1 tablet (20 mg total) by mouth daily as needed. Patient taking differently: Take 20 mg by mouth as needed for fluid. 10/16/19 01/14/20  O'Neal, Cassie Freer, MD  Nivolumab (OPDIVO IV) Inject into the vein. IV infusion every 4 weeks.    [provider]  sacubitril-valsartan (ENTRESTO) 24-26 MG Take 1 tablet by mouth 2 (two) times daily. 09/10/20   O'NealCassie Freer, MD  spironolactone (ALDACTONE) 25 MG tablet Take 0.5 tablets (12.5 mg total) by mouth daily. 08/21/20   O'Neal, Cassie Freer, MD     Family History    Family History  Problem Relation Age of Onset  . Diabetes Mother   . Hypertension Mother   . Cancer Father   . Heart disease Father    He indicated that the status of his mother is unknown. He indicated that the status of his father is unknown.   Social History    Social History   Socioeconomic History  . Marital status: Single    Spouse name: Not on file  . Number of children: Not on file  . Years of education: Not on file  . Highest education level: Not on file  Occupational History  . Not on file  Tobacco Use  . Smoking status: Never Smoker  . Smokeless tobacco: Never Used  Vaping Use  . Vaping Use: Never used  Substance and Sexual Activity  . Alcohol use: Yes    Comment: occ  . Drug use: Never  . Sexual activity: Not on file  Other Topics Concern  . Not on file  Social History Narrative  . Not on file   Social Determinants of Health   Financial Resource Strain: Not on file  Food Insecurity: Not on file  Transportation Needs: Not on file  Physical Activity: Not on file  Stress: Not on file  Social Connections: Not on file  Intimate Partner Violence: Not on file     Review of Systems    A comprehensive review of systems was performed with pertinent positives and negatives noted in the HPI.  Physical Exam    BP (!) 96/52 (BP Location: Right Arm)   Pulse (!) 37   Temp 97.8 F (36.6 C) (Oral)   Resp 14   Ht 5\' 10"  (1.778 m)   Wt 93 kg   SpO2 100%   BMI 29.41 kg/m  General: Alert, NAD HEENT: Normal  Neck: No bruits. No JVD though cannon A waves noted. Lungs:  Resp regular and unlabored, CTA bilaterally. Heart: Muffled heart sounds. Regular rhythm. Faint systolic murmur intermittently audible. Abdomen: Soft, non-tender, non-distended, BS +.  Extremities: Warm. No clubbing, cyanosis or edema. Radial pulses 2+ and equal bilaterally. Psych: Normal affect. Neuro: Alert and oriented. No gross focal deficits. No abnormal  movements.  Labs   Lab Results  Component Value Date   WBC 6.5 06/21/2020   HGB 11.9 (L) 09/11/2020   HCT 35.0 (L) 09/11/2020   MCV 94.9 06/21/2020   PLT 213 06/21/2020    Recent Labs  Lab 09/11/20 0104  NA 135  K 5.9*  CL 106  BUN 28*  CREATININE 1.40*  GLUCOSE 115*   Lab Results  Component Value Date   CHOL 199 06/22/2020   HDL 32 (L) 06/22/2020   LDLCALC 136 (H) 06/22/2020   TRIG 153 (H) 06/22/2020   Lab Results  Component Value Date   DDIMER 3.83 (H) 08/13/2019     Radiology Studies    DG Chest Portable 1  View  Result Date: 09/11/2020 CLINICAL DATA:  Initial evaluation for bradycardia. EXAM: PORTABLE CHEST 1 VIEW COMPARISON:  Prior radiograph from 08/08/2019. FINDINGS: Transverse heart size within normal limits. Mediastinal silhouette normal. Lungs hypoinflated. Different related pads overlie the left chest. No focal infiltrates. No edema or effusion. No pneumothorax. No acute osseous finding. IMPRESSION: No radiographic evidence for active cardiopulmonary disease. Electronically Signed   By: Jeannine Boga M.D.   On: 09/11/2020 01:13    ECG & Cardiac Imaging    Initial ECG in the ED shows sinus rhythm at a rate of about 80 with complete heart block and probably high fascicular escape with QRS duration of 144ms - personally reviewed.  TTE 08/08/2020: The left ventricular size is normal.  Left ventricular systolic function is low normal.  LV ejection fraction = 50-55%.  There is normal left ventricular wall thickness.  Left ventricular systolic function is normal.  Significant abnormal paradoxical septal motion; otherwise no clear  evidence of regional hypokinesis.  The right ventricle is normal in size and function.  There is no significant valvular stenosis or regurgitation.  The IVC is normal in size with an inspiratory collapse of greater then  50%, suggesting normal right atrial pressure.  There is trivial pericardial effusion.    Assessment &  Plan    Complete heart block: he had an acute drop in heart rate this evening associated with lightheadedness at rest and hypotension. He has been in complete heart block with an relatively narrow escape rhythm with LAFB morphology at a rate of about 40 bpm since EMS arrival, but he has remained normotensive and asymptomatic at our facility. He has had a chronic LBBB that was first diagnosed in the setting of COVID-19 pneumonia 07/2019 at which time he was also found to have a reduced LVEF of 30-35%. No ECG prior to this for comparison. I suspect this is progression of his chronic conduction disease, but we will obtain an echo to evaluate for Nivolumab cardiotoxicity or any obvious cardiac metastasis. Given his history of low EF and anticipation of high pacing burden, I think there should be a low threshold for BiV pacing - will re-evaluate LV function prior to implant as above. - No indication for emergent temporary pacing, currently asymptomatic and stable. Will try dopamine first if any change in hemodynamics with temporary pacing if needed.  - NPO for PPM tomorrow - Holding carvedilol  - Correcting mild hyperkalemia and hypocalcemia - Monitor on telemetry - Check TTE  Chronic heart failure with recovered LVEF: diagnosed in the setting of COVID-19 infection with bilateral PE last year, though chronicity uncertain as there was no prior echo or preceding heart failure symptoms. Troponins were negative at the time so myocarditis related to COVID seems less likely (he had been diagnosed 2 weeks before the admission). EF normalized on GDMT within a few months. He has been NYHA class 1 and currently appears euvolemic. EF was still preserved 3 months ago, but since he is now on Nivolumab, will recheck echo to exclude checkpoint inhibitor cardiotoxicity given his new heart block. - TTE as above - Hold entresto, spironolactone, and carvedilol given complete heart block and hyperkalemia - Monitor daily  weight  Mild hyperkalemia: likely secondary to AKI and RAAS inhibitors.  - IV fluid + Lasix for 1 dose - Holding spironolactone and Entresto  Mild hypocalcemia: - 2g calcium gluconate  Metastatic melanoma: with recent progression on Nivolumab, evidenced by IPH associated with new metastatic lesion. He is scheduled for  an urgent MRI tomorrow with plans for resection. I have notified his neurosurgery team regarding his admission.   History of bilateral PE: Diagnosed 07/2019 in the setting of COVID infection. Completed 6 months Xarelto, no evidence of RV dysfunction or PH on subsequent echos.   Nutrition: NPO for device placement DVT ppx: SCDs, holding heparin for PPM Advanced Care Planning: Full code, confirmed on admission   Signed, Marykay Lex, MD 09/11/2020, 1:22 AM

## 2020-09-11 NOTE — ED Provider Notes (Signed)
[facility] EMERGENCY DEPARTMENT Provider Note   CSN: [id number] Arrival date & time: 09/11/20  0045     History Chief Complaint  Patient presents with  . Bradycardia    3rd Degree Heart Block    VUONG MUSA is a 65 y.o. male.  The history is provided by the EMS personnel, medical records and the patient. The history is limited by the condition of the patient.  Weakness Severity:  Moderate Onset quality:  Gradual Duration:  1 day Timing:  Constant Progression:  Unchanged Chronicity:  New Context: not recent infection and not stress   Context comment:  Infusion for melanoma at [facility] Relieved by:  Nothing Worsened by:  Nothing Ineffective treatments:  None tried Associated symptoms: no aphasia, no ataxia, no cough, no drooling, no fever, no melena, no stroke symptoms, no syncope and no vomiting   Associated symptoms comment:  Hypotension and bradycardia Risk factors: congestive heart failure and neurologic disease   Risk factors: no family hx of stroke   Patient with LBBB, 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      Past Medical History:  Diagnosis Date  . CHF (congestive heart failure) ([place])   . Hyperlipidemia   . Hypertension   . ICH (intracerebral hemorrhage) ([place])   . LBBB (left bundle branch block)   . Melanoma ([place])    Metastatic  . Prostate cancer [facility])     Patient Active Problem List   Diagnosis Date Noted  . Stroke ([place]) 06/21/2020  . Transient visual loss of left eye 06/21/2020  . Chronic diastolic CHF (congestive heart failure) ([place]) 06/21/2020  . Chronic kidney disease, stage 3a ([place]) 06/21/2020    Past Surgical History:  Procedure Laterality Date  . APPENDECTOMY    . CHOLECYSTECTOMY    . PROSTATECTOMY         Family History  Problem Relation Age of Onset  . Diabetes Mother   . Hypertension Mother   . Cancer Father   . Heart disease Father     Social History    Tobacco Use  . Smoking status: Never Smoker  . Smokeless tobacco: Never Used  Vaping Use  . Vaping Use: Never used  Substance Use Topics  . Alcohol use: Yes    Comment: occ  . Drug use: Never    Home Medications Prior to Admission medications   Medication Sig Start Date End Date Taking? Authorizing Provider  albuterol (VENTOLIN HFA) 108 (90 Base) MCG/ACT inhaler Inhale 2 puffs into the lungs every 4 (four) hours as needed for wheezing or shortness of breath. 08/13/19   Thurnell Lose, MD  carvedilol (COREG) 6.25 MG tablet Take 1 tablet (6.25 mg total) by mouth 2 (two) times daily. 08/21/20   O'NealCassie Freer, MD  furosemide (LASIX) 20 MG tablet Take 1 tablet (20 mg total) by mouth daily as needed. Patient taking differently: Take 20 mg by mouth as needed for fluid. 10/16/19 01/14/20  O'Neal, Cassie Freer, MD  Nivolumab (OPDIVO IV) Inject into the vein. IV infusion every 4 weeks.    [provider]  sacubitril-valsartan (ENTRESTO) 24-26 MG Take 1 tablet by mouth 2 (two) times daily. 09/10/20   O'NealCassie Freer, MD  spironolactone (ALDACTONE) 25 MG tablet Take 0.5 tablets (12.5 mg total) by mouth daily. 08/21/20   O'Neal, Cassie Freer, MD    Allergies    Patient has no known allergies.  Review of Systems   Review of  Systems  Unable to perform ROS: Acuity of condition  Constitutional: Negative for fever.  HENT: Negative for drooling.   Eyes: Negative for redness.  Respiratory: Negative for cough.   Cardiovascular: Negative for leg swelling and syncope.  Gastrointestinal: Negative for melena and vomiting.  Skin: Negative for rash.  Neurological: Positive for weakness.  Psychiatric/Behavioral: Negative for agitation.    Physical Exam Updated Vital Signs There were no vitals taken for this visit.  Physical Exam Nursing note reviewed.  Constitutional:      Appearance: He is not diaphoretic.  HENT:     Head: Normocephalic and atraumatic.     Nose: Nose  normal.  Eyes:     Conjunctiva/sclera: Conjunctivae normal.     Pupils: Pupils are equal, round, and reactive to light.  Cardiovascular:     Rate and Rhythm: Regular rhythm. Bradycardia present.     Pulses: Normal pulses.     Heart sounds: Normal heart sounds.  Pulmonary:     Effort: Pulmonary effort is normal.     Breath sounds: Normal breath sounds.  Abdominal:     General: Abdomen is flat. Bowel sounds are normal.     Palpations: Abdomen is soft.     Tenderness: There is no abdominal tenderness. There is no guarding.  Musculoskeletal:     Cervical back: Normal range of motion and neck supple.     Left lower leg: No edema.  Skin:    General: Skin is warm and dry.     Capillary Refill: Capillary refill takes less than 2 seconds.  Neurological:     Mental Status: He is alert.     Deep Tendon Reflexes: Reflexes normal.  Psychiatric:        Mood and Affect: Mood normal.     ED Results / Procedures / Treatments   Labs (all labs ordered are listed, but only abnormal results are displayed) Results for orders placed or performed during the hospital encounter of 09/11/20  Resp Panel by RT-PCR (Flu A&B, Covid) Nasopharyngeal Swab   Specimen: Nasopharyngeal Swab; Nasopharyngeal(NP) swabs in vial transport medium  Result Value Ref Range   SARS Coronavirus 2 by RT PCR NEGATIVE NEGATIVE   Influenza A by PCR NEGATIVE NEGATIVE   Influenza B by PCR NEGATIVE NEGATIVE  CBC with Differential/Platelet  Result Value Ref Range   WBC 10.3 4.0 - 10.5 K/uL   RBC 3.92 (L) 4.22 - 5.81 MIL/uL   Hemoglobin 12.4 (L) 13.0 - 17.0 g/dL   HCT 37.5 (L) 39.0 - 52.0 %   MCV 95.7 80.0 - 100.0 fL   MCH 31.6 26.0 - 34.0 pg   MCHC 33.1 30.0 - 36.0 g/dL   RDW 13.1 11.5 - 15.5 %   Platelets 184 150 - 400 K/uL   nRBC 0.0 0.0 - 0.2 %   Neutrophils Relative % 46 %   Neutro Abs 4.8 1.7 - 7.7 K/uL   Lymphocytes Relative 32 %   Lymphs Abs 3.2 0.7 - 4.0 K/uL   Monocytes Relative 10 %   Monocytes Absolute 1.0  0.1 - 1.0 K/uL   Eosinophils Relative 11 %   Eosinophils Absolute 1.1 (H) 0.0 - 0.5 K/uL   Basophils Relative 1 %   Basophils Absolute 0.1 0.0 - 0.1 K/uL   Immature Granulocytes 0 %   Abs Immature Granulocytes 0.03 0.00 - 0.07 K/uL  Comprehensive metabolic panel  Result Value Ref Range   Sodium 132 (L) 135 - 145 mmol/L   Potassium 5.7 (H) 3.5 -  5.1 mmol/L   Chloride 103 98 - 111 mmol/L   CO2 22 22 - 32 mmol/L   Glucose, Bld 121 (H) 70 - 99 mg/dL   BUN 20 8 - 23 mg/dL   Creatinine, Ser 1.46 (H) 0.61 - 1.24 mg/dL   Calcium 8.6 (L) 8.9 - 10.3 mg/dL   Total Protein 5.2 (L) 6.5 - 8.1 g/dL   Albumin 3.2 (L) 3.5 - 5.0 g/dL   AST 45 (H) 15 - 41 U/L   ALT 13 0 - 44 U/L   Alkaline Phosphatase 52 38 - 126 U/L   Total Bilirubin 0.9 0.3 - 1.2 mg/dL   GFR, Estimated 53 (L) >60 mL/min   Anion gap 7 5 - 15  Magnesium  Result Value Ref Range   Magnesium 2.3 1.7 - 2.4 mg/dL  I-stat chem 8, ED (not at [facility] or [facility])  Result Value Ref Range   Sodium 135 135 - 145 mmol/L   Potassium 5.9 (H) 3.5 - 5.1 mmol/L   Chloride 106 98 - 111 mmol/L   BUN 28 (H) 8 - 23 mg/dL   Creatinine, Ser 1.40 (H) 0.61 - 1.24 mg/dL   Glucose, Bld 115 (H) 70 - 99 mg/dL   Calcium, Ion 1.07 (L) 1.15 - 1.40 mmol/L   TCO2 22 22 - 32 mmol/L   Hemoglobin 11.9 (L) 13.0 - 17.0 g/dL   HCT 35.0 (L) 39.0 - 52.0 %  Troponin I (High Sensitivity)  Result Value Ref Range   Troponin I (High Sensitivity) 436 (HH) <18 ng/L   DG Chest Portable 1 View  Result Date: 09/11/2020 CLINICAL DATA:  Initial evaluation for bradycardia. EXAM: PORTABLE CHEST 1 VIEW COMPARISON:  Prior radiograph from 08/08/2019. FINDINGS: Transverse heart size within normal limits. Mediastinal silhouette normal. Lungs hypoinflated. Different related pads overlie the left chest. No focal infiltrates. No edema or effusion. No pneumothorax. No acute osseous finding. IMPRESSION: No radiographic evidence for active cardiopulmonary disease. Electronically Signed   By:  Jeannine Boga M.D.   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  Procedures Procedures   Medications Ordered in ED Medications  calcium gluconate 2 g/ 100 mL sodium chloride IVPB (2,000 mg Intravenous New Bag/Given 09/11/20 0238)  albuterol (PROVENTIL) (2.5 MG/3ML) 0.083% nebulizer solution 2.5 mg (has no administration in time range)  atropine 1 % ophthalmic solution 1 drop (has no administration in time range)  neomycin-polymyxin-dexamethasone (MAXITROL) 0.1 % ophthalmic suspension 1 drop (has no administration in time range)  prednisoLONE acetate (PRED FORTE) 1 % ophthalmic suspension 1 drop (has no administration in time range)  sodium chloride flush (NS) 0.9 % injection 3 mL (3 mLs Intravenous Given 09/11/20 0245)  sodium chloride flush (NS) 0.9 % injection 3 mL (has no administration in time range)  0.9 %  sodium chloride infusion (has no administration in time range)  acetaminophen (TYLENOL) tablet 650 mg (has no administration in time range)  ondansetron (ZOFRAN) injection 4 mg (has no administration in time range)  furosemide (LASIX) injection 30 mg (30 mg Intravenous Given 09/11/20 0240)    And  lactated ringers bolus 1,000 mL (  1,000 mLs Intravenous New Bag/Given 09/11/20 0242)     Transcutaneous pacer on patient   MDM Reviewed: previous chart, nursing note and vitals Interpretation: labs, ECG and x-ray (elevated potassium, repeating.  Elevated troponin.  ) Total time providing critical care: 30-74 minutes (complete heart block to the ICU). This excludes time spent performing separately reportable procedures and services. Consults:  cardiology  CRITICAL CARE Performed by: Huma Imhoff K Tillman Kazmierski-Rasch Total critical care time: 31 minutes Critical care time was exclusive of separately billable procedures and treating other patients. Critical care was necessary to treat or prevent imminent or life-threatening deterioration. Critical care was time spent personally by me on the following activities: development of treatment plan with patient and/or surrogate as well as nursing, discussions with consultants, evaluation of patient's response to treatment, examination of patient, obtaining history from patient or surrogate, ordering and performing treatments and interventions, ordering and review of laboratory studies, ordering and review of radiographic studies, pulse oximetry and re-evaluation of patient's condition.  ED Course  I have reviewed the triage vital signs and the nursing notes.  Pertinent labs & imaging results that were available during my care of the patient were reviewed by me and considered in my medical decision making (see chart for details).   Dr. Kalman Shan at the bedside.   JAQUAVIS FELMLEE was evaluated in Emergency Department on 09/11/2020 for the symptoms described in the history of present illness. He was evaluated in the context of the global COVID-19 pandemic, which necessitated consideration that the patient might be at risk for infection with the SARS-CoV-2 virus that causes COVID-19. Institutional protocols and algorithms that pertain to the evaluation of patients at risk for COVID-19 are in a state of rapid change based on information released by regulatory bodies including the CDC and federal and state organizations. These policies and algorithms were followed during the patient's care in the ED.   Final Clinical Impression(s) / ED Diagnoses Final diagnoses:  Complete heart block [facility])    Admit to cardiology    Marquitta Persichetti, MD 09/11/20 [id number]

## 2020-09-11 NOTE — Progress Notes (Signed)
MRI result markedly abnormal Verbally reported to [name], NP with the patient neurosurgeon Result with no particular new findings and without any new neurological symptoms, no need for anything acutely. Plan to follow up with them Tuesday as planned to discuss his surgery on Thursday next week.  I went by to see the patient, he is feeling well, no neuro symptoms, no headaches, blurred vision or new symptoms of any kind.  No symptoms of bradycardia Remains in CHB, rates 40's BP stable  Tommye Standard, PA-C

## 2020-09-12 ENCOUNTER — Inpatient Hospital Stay (HOSPITAL_COMMUNITY)
Admission: EM | Disposition: A | Discharge status: Home / Self Care | Payer: Self-pay | Source: Home / Self Care | Attending: Cardiology

## 2020-09-12 DIAGNOSIS — Z950 Presence of cardiac pacemaker: Secondary | ICD-10-CM

## 2020-09-12 HISTORY — DX: Presence of cardiac pacemaker: Z95.0

## 2020-09-12 HISTORY — PX: PACEMAKER IMPLANT: EP1218

## 2020-09-12 LAB — SURGICAL PCR SCREEN
MRSA, PCR: NEGATIVE
Staphylococcus aureus: NEGATIVE

## 2020-09-12 LAB — BASIC METABOLIC PANEL
Anion gap: 4 — ABNORMAL LOW (ref 5–15)
BUN: 17 mg/dL (ref 8–23)
CO2: 26 mmol/L (ref 22–32)
Calcium: 9.3 mg/dL (ref 8.9–10.3)
Chloride: 106 mmol/L (ref 98–111)
Creatinine, Ser: 1.63 mg/dL — ABNORMAL HIGH (ref 0.61–1.24)
GFR, Estimated: 47 mL/min — ABNORMAL LOW (ref >60)
Glucose, Bld: 112 mg/dL — ABNORMAL HIGH (ref 70–99)
Potassium: 5.1 mmol/L (ref 3.5–5.1)
Sodium: 136 mmol/L (ref 135–145)

## 2020-09-12 SURGERY — PACEMAKER IMPLANT

## 2020-09-12 MED ORDER — LIDOCAINE HCL 1 % IJ SOLN
INTRAMUSCULAR | Status: AC
  Filled 2020-09-12: qty 20

## 2020-09-12 MED ORDER — SODIUM CHLORIDE 0.9 % IV SOLN
INTRAVENOUS | Status: AC
  Filled 2020-09-12: qty 2

## 2020-09-12 MED ORDER — FENTANYL CITRATE (PF) 100 MCG/2ML IJ SOLN
INTRAMUSCULAR | Status: AC
  Filled 2020-09-12: qty 2

## 2020-09-12 MED ORDER — FENTANYL CITRATE (PF) 100 MCG/2ML IJ SOLN
INTRAMUSCULAR | Status: DC | PRN
  Administered 2020-09-12: 12.5 ug via INTRAVENOUS

## 2020-09-12 MED ORDER — HEPARIN (PORCINE) IN NACL 1000-0.9 UT/500ML-% IV SOLN
INTRAVENOUS | Status: AC
  Filled 2020-09-12: qty 500

## 2020-09-12 MED ORDER — MIDAZOLAM HCL 5 MG/5ML IJ SOLN
INTRAMUSCULAR | Status: AC
  Filled 2020-09-12: qty 5

## 2020-09-12 MED ORDER — CEFAZOLIN SODIUM-DEXTROSE 2-4 GM/100ML-% IV SOLN
INTRAVENOUS | Status: AC
  Filled 2020-09-12: qty 100

## 2020-09-12 MED ORDER — MIDAZOLAM HCL 5 MG/5ML IJ SOLN
INTRAMUSCULAR | Status: DC | PRN
  Administered 2020-09-12: 0.5 mg via INTRAVENOUS

## 2020-09-12 SURGICAL SUPPLY — 15 items
BAG SNAP BAND KOVER 36X36 (MISCELLANEOUS) ×1 IMPLANT
CABLE SURGICAL S-101-97-12 (CABLE) ×2 IMPLANT
CATH RIGHTSITE C315HIS02 (CATHETERS) ×1 IMPLANT
IPG PACE AZUR XT DR MRI W1DR01 (Pacemaker) IMPLANT
LEAD CAPSURE NOVUS 5076-52CM (Lead) ×1 IMPLANT
LEAD SELECT SECURE 3830 383069 (Lead) IMPLANT
PACE AZURE XT DR MRI W1DR01 (Pacemaker) ×2 IMPLANT
PAD PRO RADIOLUCENT 2001M-C (PAD) ×2 IMPLANT
POUCH AIGIS-R ANTIBACT PPM (Mesh General) ×2 IMPLANT
POUCH AIGIS-R ANTIBACT PPM MED (Mesh General) IMPLANT
SELECT SECURE 3830 383069 (Lead) ×2 IMPLANT
SHEATH 7FR PRELUDE SNAP 13 (SHEATH) ×4 IMPLANT
SLITTER 6232ADJ (MISCELLANEOUS) ×1 IMPLANT
TRAY PACEMAKER INSERTION (PACKS) ×2 IMPLANT
WIRE HI TORQ VERSACORE-J 145CM (WIRE) ×1 IMPLANT

## 2020-09-12 NOTE — Discharge Instructions (Signed)
    Supplemental Discharge Instructions for  Pacemaker/Defibrillator Patients  Activity No heavy lifting or vigorous activity with your left/right arm for 6 to 8 weeks.  Do not raise your left/right arm above your head for one week.  Gradually raise your affected arm as drawn below.             09/20/20                        09/21/20                     09/22/20                    09/23/20 __  NO DRIVING until cleared by your neurology team/doctors  WOUND CARE - Keep the wound area clean and dry.  Do not get this area wet , no showersuntil cleared to at your wound check visit - The tape/steri-strips on your wound will fall off; do not pull them off.  No bandage is needed on the site.  DO  NOT apply any creams, oils, or ointments to the wound area. - If you notice any drainage or discharge from the wound, any swelling or bruising at the site, or you develop a fever > 101? F after you are discharged home, call the office at once.  Special Instructions - You are still able to use cellular telephones; use the ear opposite the side where you have your pacemaker/defibrillator.  Avoid carrying your cellular phone near your device. - When traveling through airports, show security personnel your identification card to avoid being screened in the metal detectors.  Ask the security personnel to use the hand wand. - Avoid arc welding equipment, MRI testing (magnetic resonance imaging), TENS units (transcutaneous nerve stimulators).  Call the office for questions about other devices. - Avoid electrical appliances that are in poor condition or are not properly grounded. - Microwave ovens are safe to be near or to operate.

## 2020-09-12 NOTE — Plan of Care (Signed)
  Problem: Education: Goal: Knowledge of General Education information will improve Description: Including pain rating scale, medication(s)/side effects and non-pharmacologic comfort measures Outcome: Progressing   Problem: Health Behavior/Discharge Planning: Goal: Ability to manage health-related needs will improve Outcome: Progressing   Problem: Health Behavior/Discharge Planning: Goal: Ability to manage health-related needs will improve Outcome: Progressing   Problem: Clinical Measurements: Goal: Ability to maintain clinical measurements within normal limits will improve Outcome: Progressing Goal: Will remain free from infection Outcome: Progressing Goal: Diagnostic test results will improve Outcome: Progressing Goal: Respiratory complications will improve Outcome: Progressing Goal: Cardiovascular complication will be avoided Outcome: Progressing   

## 2020-09-12 NOTE — Plan of Care (Signed)
  Problem: Clinical Measurements: Goal: Will remain free from infection Outcome: Progressing Goal: Cardiovascular complication will be avoided Outcome: Progressing   Problem: Coping: Goal: Level of anxiety will decrease Outcome: Progressing   Problem: Pain Managment: Goal: General experience of comfort will improve Outcome: Progressing

## 2020-09-12 NOTE — Progress Notes (Addendum)
Progress Note  Patient Name: Logan Harrison Date of Encounter: 09/12/2020  [facility] Cardiologist: Evalina Field, MD   Subjective   I feel well, no new symptoms, no CP, no SOB, no dizziness  Inpatient Medications    Scheduled Meds: . atropine  1 drop Left Eye BID  . Chlorhexidine Gluconate Cloth  6 each Topical Daily  . gentamicin irrigation  80 mg Irrigation On Call  . neomycin-polymyxin b-dexamethasone  1 drop Left Eye TID  . prednisoLONE acetate  1 drop Left Eye Daily  . sodium chloride flush  3 mL Intravenous Q12H  . sodium chloride flush  3 mL Intravenous Q12H   Continuous Infusions: . sodium chloride    . sodium chloride Stopped (09/11/20 2304)  .  ceFAZolin (ANCEF) IV     PRN Meds: sodium chloride, acetaminophen, albuterol, ondansetron (ZOFRAN) IV, sodium chloride flush, sodium chloride flush   Vital Signs    Vitals:   09/12/20 0400 09/12/20 0500 09/12/20 0600 09/12/20 0736  BP: (!) 88/50 (!) 88/45 (!) 92/52   Pulse: (!) 37 (!) 33 (!) 37   Resp: (!) 23 17 (!) 23   Temp:    99.1 F (37.3 C)  TempSrc:    Oral  SpO2: 96% 98% 94%   Weight:      Height:        Intake/Output Summary (Last 24 hours) at 09/12/2020 0755 Last data filed at 09/12/2020 0000 Gross per 24 hour  Intake 220 ml  Output 800 ml  Net -580 ml   Last 3 Weights 09/11/2020 08/21/2020 06/21/2020  Weight (lbs) 205 lb 205 lb 12.8 oz 223 lb 12.8 oz  Weight (kg) 92.987 kg 93.35 kg 101.515 kg      Telemetry    CHB 35-40's - Personally Reviewed  ECG    No new EKGs - Personally Reviewed  Physical Exam   GEN: No acute distress.   Neck: No JVD Cardiac: RRR, no murmurs, rubs, or gallops.  Respiratory: CTA b/l. GI: Soft, nontender, non-distended  MS: No edema; No deformity. Neuro:  Nonfocal, known baseline L facial slight droop/asymmetry Psych: Normal affect   Labs    High Sensitivity Troponin:   Recent Labs  Lab 09/11/20 0050  TROPONINIHS 436*      Chemistry Recent Labs   Lab 09/11/20 0050 09/11/20 0104 09/11/20 0237 09/12/20 0148  NA 132* 135  --  136  K 5.7* 5.9* 4.5 5.1  CL 103 106  --  106  CO2 22  --   --  26  GLUCOSE 121* 115*  --  112*  BUN 20 28*  --  17  CREATININE 1.46* 1.40*  --  1.63*  CALCIUM 8.6*  --   --  9.3  PROT 5.2*  --   --   --   ALBUMIN 3.2*  --   --   --   AST 45*  --   --   --   ALT 13  --   --   --   ALKPHOS 52  --   --   --   BILITOT 0.9  --   --   --   GFRNONAA 53*  --   --  47*  ANIONGAP 7  --   --  4*     Hematology Recent Labs  Lab 09/11/20 0050 09/11/20 0104  WBC 10.3  --   RBC 3.92*  --   HGB 12.4* 11.9*  HCT 37.5* 35.0*  MCV 95.7  --  MCH 31.6  --   MCHC 33.1  --   RDW 13.1  --   PLT 184  --     BNPNo results for input(s): BNP, PROBNP in the last 168 hours.   DDimer No results for input(s): DDIMER in the last 168 hours.   Radiology    MR BRAIN W WO CONTRAST Result Date: 09/11/2020 CLINICAL DATA:  Metastatic melanoma. EXAM: MRI HEAD WITHOUT AND WITH CONTRAST TECHNIQUE: Multiplanar, multiecho pulse sequences of the brain and surrounding structures were obtained without and with intravenous contrast. CONTRAST:  69mL GADAVIST GADOBUTROL 1 MMOL/ML IV SOLN COMPARISON:  08/08/2020. FINDINGS: Brain: A hemorrhagic mass in the right frontal lobe measures 3.5 x 4.1 x 5.1 cm, increased in size from the prior study. The mass is largely intrinsically T1 hyperintense which limits assessment for enhancement, although there is at least mild peripheral enhancement. There is a more nodular appearance of the anterior portion of the mass, and there is mild surrounding edema with regional sulcal effacement, mild mass effect on the frontal horn of the right lateral ventricle, and 3 mm of leftward midline shift. Additionally, there are numerous new predominantly punctate nodular foci of intrinsic T1 hyperintensity along the surface of both cerebral hemispheres, particularly prominent in the high frontal regions. These appear to  be at least partially intraparenchymal with superficial cortical involvement, however a component of leptomeningeal disease is also possible. There is also a new subcentimeter T1 hyperintense lesion involving the pineal gland. No acute infarct or extra-axial fluid collection is identified. Cerebral volume is within normal limits for age. Vascular: Major intracranial vascular flow voids are preserved. Skull and upper cervical spine: Unremarkable bone marrow signal. Sinuses/Orbits: Increased size of a 6 mm T1 hyperintense nodule anteriorly in the left globe inferior to the lens. New 5 mm T1 hyperintense mass anteriorly in the right globe medial and superior to the lens. Paranasal sinuses and mastoid air cells are clear. Other: None. IMPRESSION: 1. Increased size of right frontal hemorrhagic mass with mild edema and 3 mm of leftward midline shift. 2. Numerous new punctate lesions along the surface of the brain consistent with metastases. Some of these appear to be located superficially within cerebral cortex, however a component of leptomeningeal disease is also possible. 3. New lesions involving the pineal gland and both ocular globes consistent with metastases. Electronically Signed   By: Logan Bores M.D.   On: 09/11/2020 17:33     Cardiac Studies    09/11/20: TTE IMPRESSIONS  1. Complete heart block noted with junctional escape.  2. Left ventricular ejection fraction, by estimation, is 60 to 65%. The  left ventricle has normal function. The left ventricle has no regional  wall motion abnormalities. There is mild asymmetric left ventricular  hypertrophy of the basal-septal segment.  Left ventricular diastolic parameters are indeterminate.  3. Right ventricular systolic function is normal. The right ventricular  size is normal. Tricuspid regurgitation signal is inadequate for assessing  PA pressure.  4. The mitral valve is grossly normal. No evidence of mitral valve  regurgitation. No evidence  of mitral stenosis.  5. The aortic valve is tricuspid. Aortic valve regurgitation is not  visualized. No aortic stenosis is present.  6. The inferior vena cava is normal in size with greater than 50%  respiratory variability, suggesting right atrial pressure of 3 mmHg.   Comparison(s): Changes from prior study are noted. Complete heart block is  now present. EF remains unchanged.    06/22/20: TTE IMPRESSIONS  1.  Left ventricular ejection fraction, by estimation, is 60 to 65%. The  left ventricle has normal function. The left ventricle has no regional  wall motion abnormalities. Left ventricular diastolic parameters are  consistent with Grade I diastolic  dysfunction (impaired relaxation).  2. Right ventricular systolic function is normal. The right ventricular  size is normal.  3. Left atrial size was moderately dilated.  4. The mitral valve is grossly normal. Trivial mitral valve  regurgitation. No evidence of mitral stenosis.  5. The aortic valve is normal in structure. Aortic valve regurgitation is  not visualized. No aortic stenosis is present.   Comparison(s): No significant change from prior study.    09/06/19: stress myoview  Nuclear stress EF: 35%.  The left ventricular ejection fraction is moderately decreased (30-44%).  Defect 1: There is a large defect of moderate severity present in the basal anterior, basal anteroseptal, mid anterior, mid anteroseptal, apical anterior, apical septal, apical lateral and apex location.  Defect 2: There is a medium defect of moderate severity present in the basal inferoseptal, basal inferior, mid inferoseptal, mid inferior and apical inferior location.  Findings consistent with prior myocardial infarction.  This is an intermediate risk study. No ischemia  Fixed perfusion defects in anterior, septal, and inferior walls, consistent with prior infarcts. No ischemia seen.  Patient Profile     65 y.o. 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  1. Systolic HF -08/5372 -EF 82-70%-> 60-65% (11/27/2019) -NM stress with LBBB artifact 2. LBBB -QRS 160 ms -QRS 170 (2014-2019 per old EKGs) 3. DVT  -2/2 covid PNA(07/2019) 4. Prostate CA 5. HLD 6. OSA -moderate 7. 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 8. Metastatic melanoma  -eye/bone/brain? -on nivolumab  Assessment & Plan    1. CHB  He is on coreg (1/2 life 7-10 hours) and held on admission, last dose was approx 1800 09/10/20 HS Trop 436 with no delta No c/o CP or SOB  He remains in CHB this AM Planned for PPM this afternoon   2. Metastatic melanoma, brain mass/ICH We have discussed case with his neurosurgeon's NP Corrinne Eagle. She has requested that we obtain a brain MRI today with/without for them prior to pacer implant for upcoming tumor resection surgery. Dr. Quentin Ore has discussed post pacing and surgical concerns, mostly infection and recommendations for antibiotic prophylaxis  MRI was completed and reviewed with the NP last evening without appreciable changes, without any new neurological changes, symptoms, planned for appointment with them as scheduled Tuesday and planned for surgery Thursday next week    For questions or updates, please contact [facility] Please consult [email or web address] for contact info under        Signed, Baldwin Jamaica, PA-C  09/12/2020, 7:55 AM

## 2020-09-13 ENCOUNTER — Inpatient Hospital Stay (HOSPITAL_COMMUNITY): Payer: Managed Care, Other (non HMO)

## 2020-09-13 ENCOUNTER — Encounter (HOSPITAL_COMMUNITY): Payer: Self-pay | Admitting: Cardiology

## 2020-09-13 DIAGNOSIS — C439 Malignant melanoma of skin, unspecified: Secondary | ICD-10-CM | POA: Diagnosis present

## 2020-09-13 DIAGNOSIS — C799 Secondary malignant neoplasm of unspecified site: Secondary | ICD-10-CM | POA: Diagnosis present

## 2020-09-13 LAB — BASIC METABOLIC PANEL
Anion gap: 5 (ref 5–15)
BUN: 19 mg/dL (ref 8–23)
CO2: 25 mmol/L (ref 22–32)
Calcium: 9 mg/dL (ref 8.9–10.3)
Chloride: 108 mmol/L (ref 98–111)
Creatinine, Ser: 1.6 mg/dL — ABNORMAL HIGH (ref 0.61–1.24)
GFR, Estimated: 48 mL/min — ABNORMAL LOW (ref >60)
Glucose, Bld: 119 mg/dL — ABNORMAL HIGH (ref 70–99)
Potassium: 4.1 mmol/L (ref 3.5–5.1)
Sodium: 138 mmol/L (ref 135–145)

## 2020-09-13 IMAGING — CR DG CHEST 2V
2 series · 2 of 2 positions shown · non-contrast
Comparison: [DATE].

CLINICAL DATA: Evaluate pacemaker placement.

EXAM:
CHEST - 2 VIEW

[chest lat]
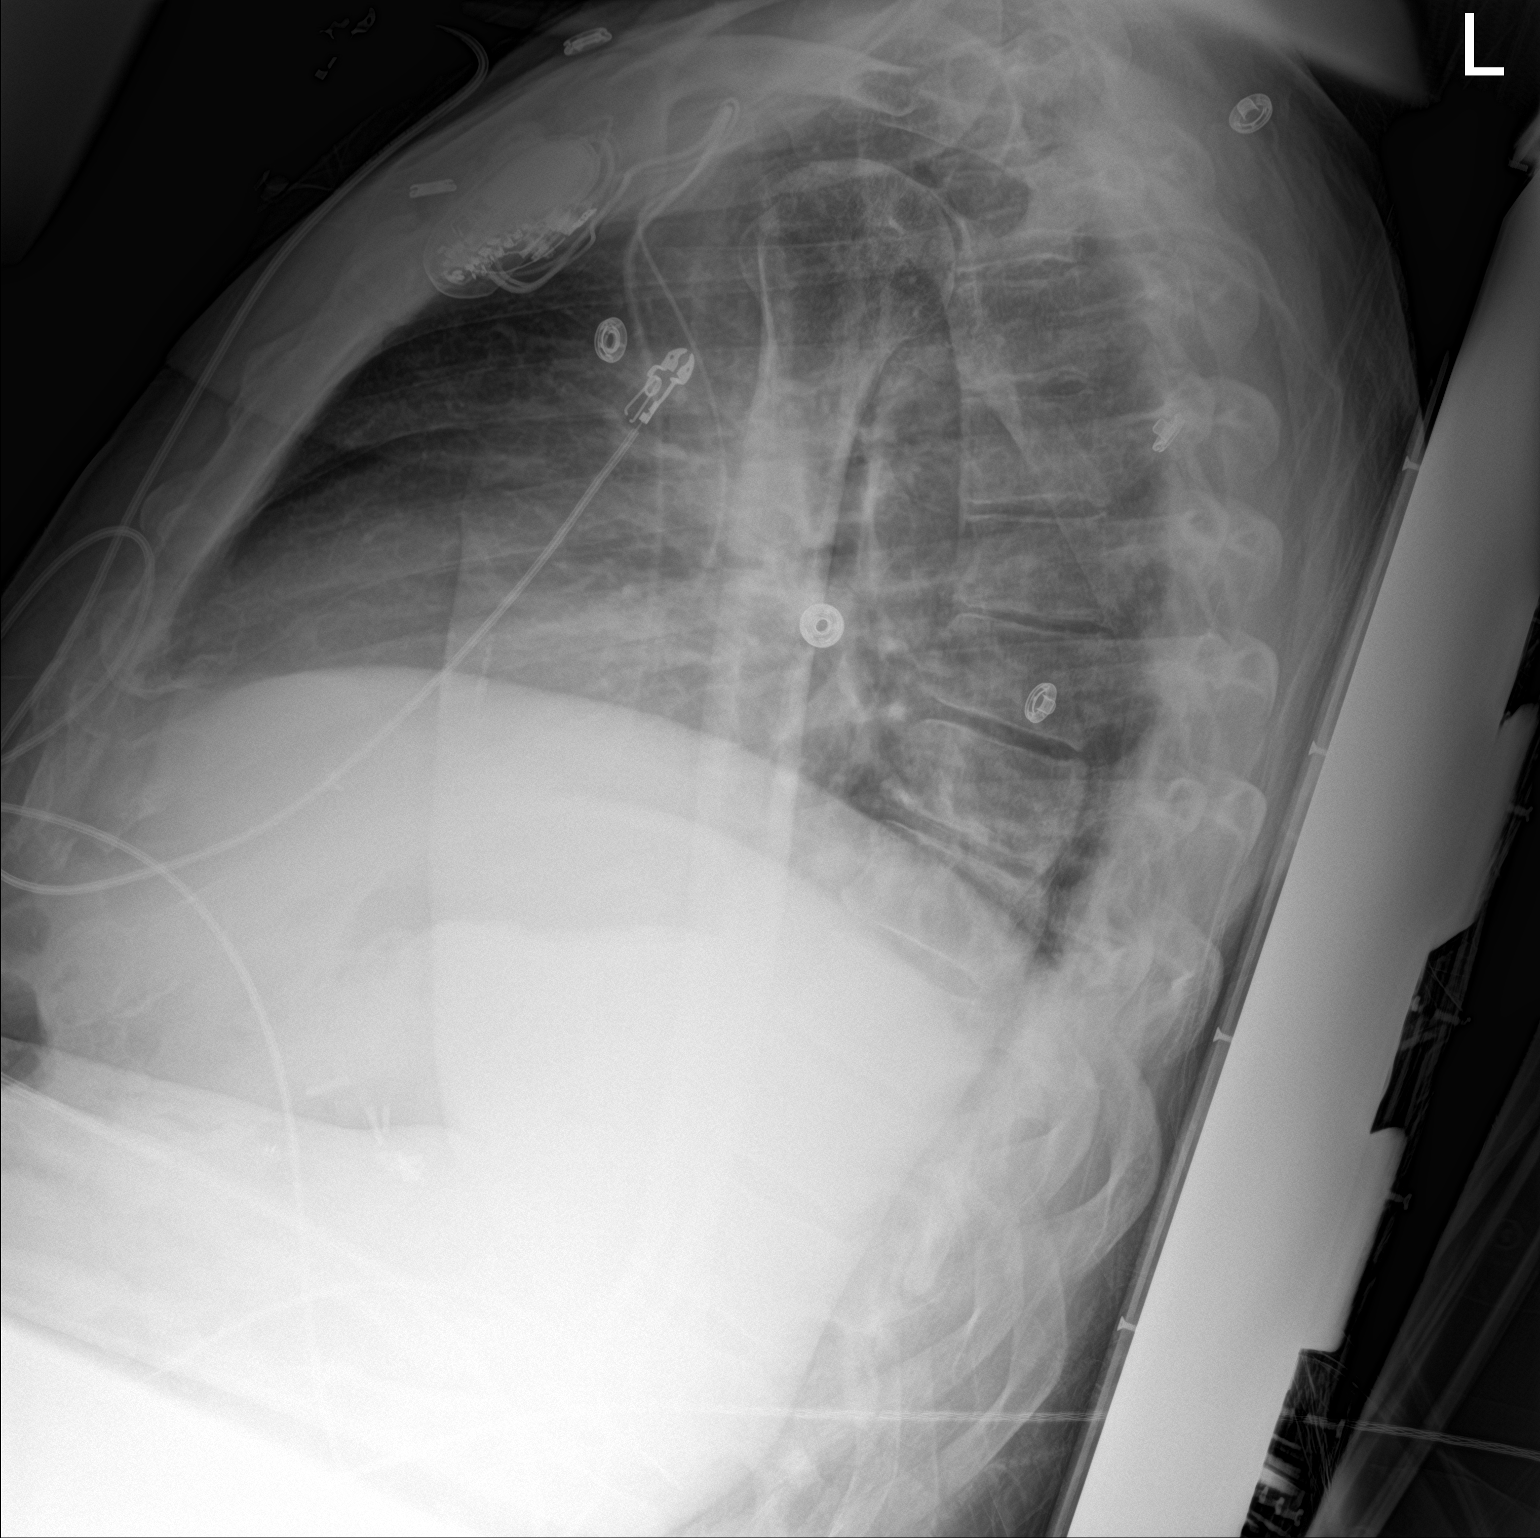

[chest ap]
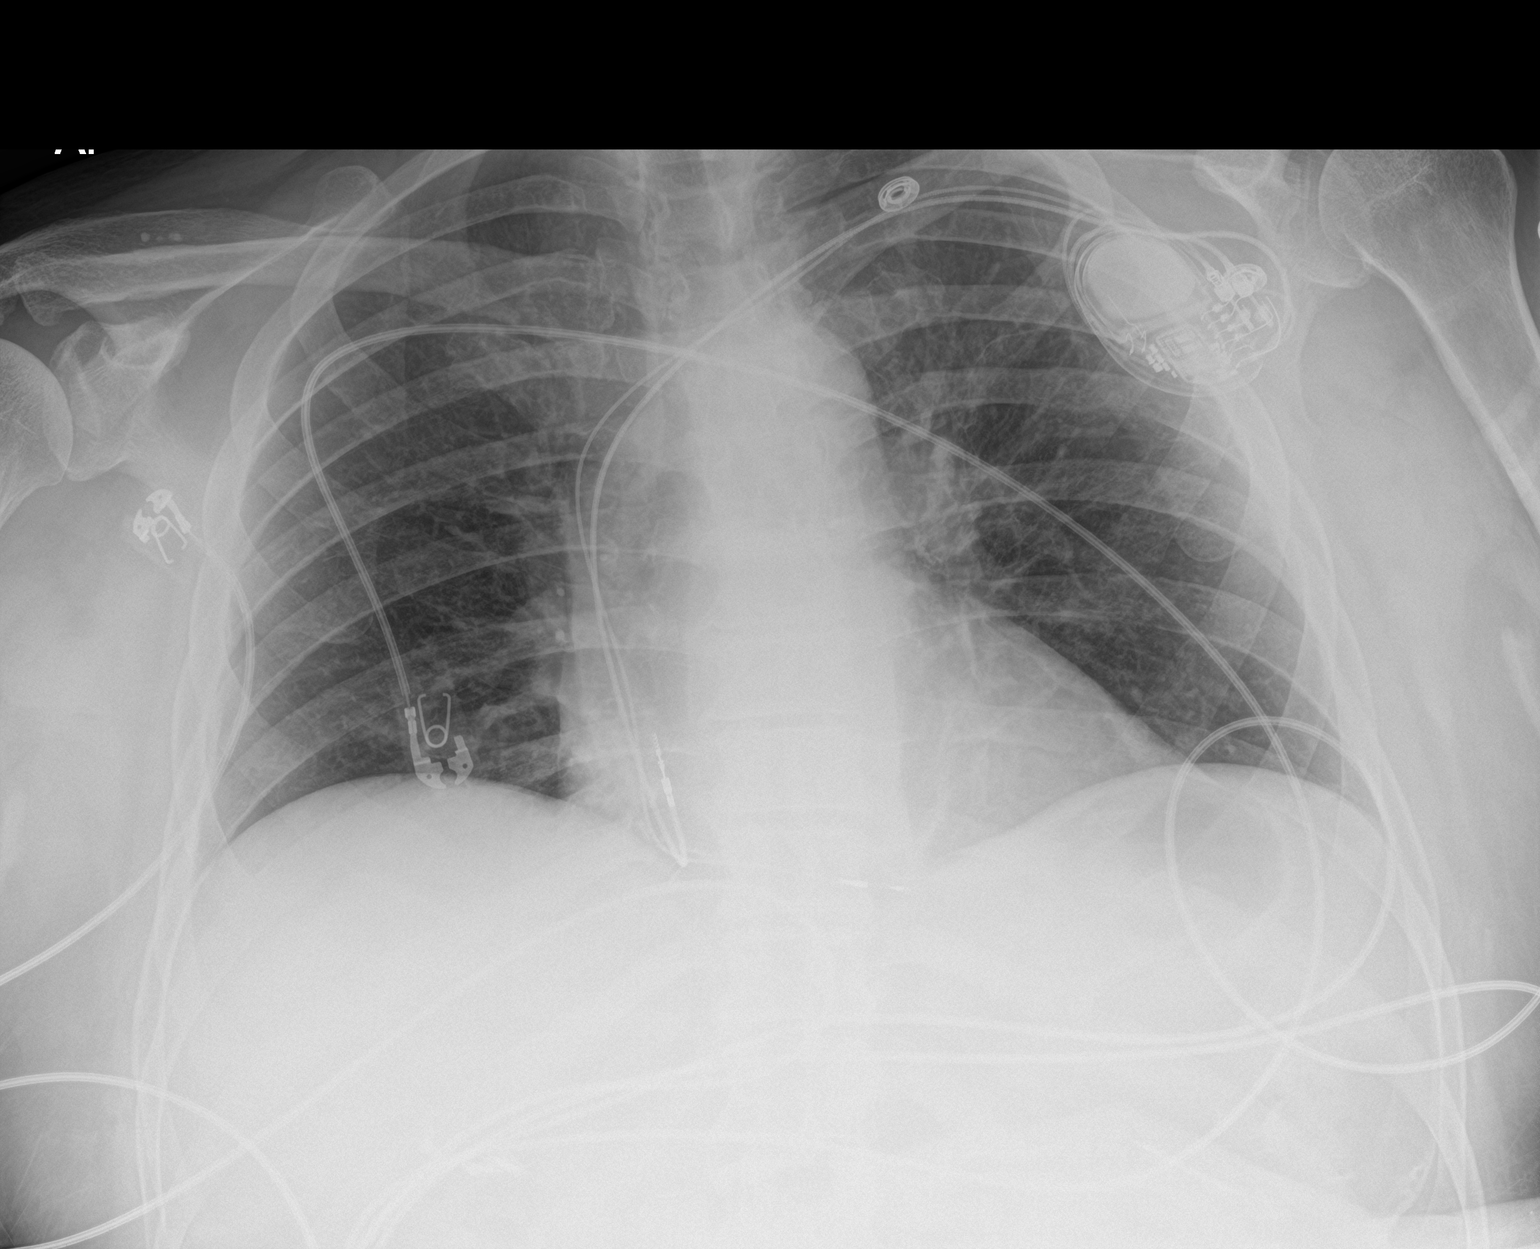

[2 of 2 positions shown; findings below may reference images not displayed]

FINDINGS: Left anterior chest wall sequential pacemaker has its leads
projecting in the right atrium and right ventricle on the frontal
view, lead tips not well-defined on the lateral view.

No pneumothorax.

Heart, mediastinum and hila are unremarkable.  Lungs are clear.

Skeletal structures are intact.
IMPRESSION: 1. Left anterior chest wall pacemaker leads appear well positioned,
only well-defined on the AP view.
2. No pneumothorax.  No active cardiopulmonary disease.

## 2020-09-13 MED ORDER — NOREPINEPHRINE 4 MG/250ML-% IV SOLN
INTRAVENOUS | Status: AC
  Filled 2020-09-13: qty 250

## 2020-09-13 NOTE — Discharge Summary (Addendum)
Discharge Summary    Patient ID: Logan Harrison MRN: [id number]; DOB: [birth year]  Admit date: 09/11/2020 Discharge date: 09/13/2020  Primary Care Provider: [facility]  Primary Cardiologist: Evalina Field, MD   Discharge Diagnoses    Principal Problem:   Complete heart block [facility]) Active Problems:   Hyperkalemia   Chronic systolic heart failure ([place])   S/P placement of cardiac pacemaker   Metastatic melanoma ([place])   Allergies No Known Allergies  Diagnostic Studies/Procedures    09/12/20 PACEMAKER IMPLANT  Procedural Details Technical Details SURGEON:  Lars Mage, MD      PREPROCEDURE DIAGNOSES:   1. Complete heart block     POSTPROCEDURE DIAGNOSES:  1. Complete heart block     PROCEDURES:    1. Dual chamber permanent pacemaker implant  CONCLUSIONS:   1. Complete heart block  2. Dual chamber permanent pacemaker implantation  3.  No early apparent complications.        _____________  Echo 09/11/20 IMPRESSIONS  1. Complete heart block noted with junctional escape.  2. Left ventricular ejection fraction, by estimation, is 60 to 65%. The  left ventricle has normal function. The left ventricle has no regional  wall motion abnormalities. There is mild asymmetric left ventricular  hypertrophy of the basal-septal segment.  Left ventricular diastolic parameters are indeterminate.  3. Right ventricular systolic function is normal. The right ventricular  size is normal. Tricuspid regurgitation signal is inadequate for assessing  PA pressure.  4. The mitral valve is grossly normal. No evidence of mitral valve  regurgitation. No evidence of mitral stenosis.  5. The aortic valve is tricuspid. Aortic valve regurgitation is not  visualized. No aortic stenosis is present.  6. The inferior vena cava is normal in size with greater than 50%  respiratory variability, suggesting right atrial pressure of 3 mmHg.   Comparison(s): Changes from  prior study are noted. Complete heart block is  now present. EF remains unchanged.    _________________________  Brain MR 09/11/20 IMPRESSION: 1. Increased size of right frontal hemorrhagic mass with mild edema and 3 mm of leftward midline shift. 2. Numerous new punctate lesions along the surface of the brain consistent with metastases. Some of these appear to be located superficially within cerebral cortex, however a component of leptomeningeal disease is also possible. 3. New lesions involving the pineal gland and both ocular globes consistent with metastases.  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   Pt is followed by Dr. Audie Box. He has had a chronic LBBB that was first diagnosed in the setting of COVID-19 pneumonia with bilateral PEs in 07/2019 at which time he was also found to have a reduced LVEF of 30-35%. His EF normalized on GDMT.   The patient was in his [place] until 07/2020 when admitted to [facility] for intermittent "memory gaps" at which time he was found to have a right frontal IPH associated with vasogenic edema related to likely new metastatic disease from melanoma. He did not require any acute intervention at the time. Echocardiogram again demonstrated normal LVEF.  He has been less active since then, but had not been having any issues with exertional intolerance, dyspnea, lightheadedness/dizziness, syncope. His blood pressure has been a little lower however, 94/60 at his visit with Dr. Audie Box on May 5. He was instructed to decrease his  Entresto. He went for nivolumab infusion on 5/26 which he tolerated well. He then became more lightheaded, even at rest. His home BP at the time was 72/40 with a drop in HR to 40. He had taken his carvedilol as prescribed. His called our triage line and reported a BP of 62/35 with a HR of 39 and was instructed to call EMS and come the hospital. During transport his BP remained 90s/50s with a HR of 40 and ECG that showed complete heart block with a QRS around 130 ms. Initial labs in the ED are notable for a potassium of 5.7 (5.9 on repeat) with Cr around baseline at 1.4. iCa is slightly low at 1.07. He was admitted to the ICU and EP was consulted.   Hospital Course     Consultants: neurosurgery [facility]  Complete heart block: coreg was held and initially treated with DA. He remained in CHB despite BB washout. He was admitted to the ICU and underwent successful PPM placement with an Azure XT DR MRI SureScan by Dr. Quentin Ore on 09/12/20. CXR with no PTX. ECG today shows A sensed rhythm with HR 105.   Chronic heart failure with recovered LVEF: diagnosed in the setting of COVID-19 infection with bilateral PE last year. EF normalized on GDMT. Echo this admission with EF 60%. Now that EF has normalized, GDMT will be discontinued given soft BPs. These can be added back as an outpatient if BP improves.  Mild hyperkalemia: likely secondary to AKI and RAAS inhibitors. Treated with IV fluid + Lasix x1. Arlyce Harman has been discontinued. Would recheck BMET at follow up  Mild hypocalcemia: treated with 2g calcium gluconate  Metastatic melanoma: with recent progression on Nivolumab, evidenced by IPH associated with new metastatic lesion. Case discussed with neurosurgery team at [facility] and MR brain ordered while admitted to help in planning for surgical resection next week (report copied above).   History of bilateral PE: diagnosed 07/2019 in the setting of COVID infection. Completed 6 months Xarelto, no evidence of RV  dysfunction or PH on subsequent echos.   HTN: BP has been soft. Will hold home entresto, coreg and spiro  CKD stage III: creat 1.6 which appears to be within his previous baseline 1.3-1.69 _____________  Discharge Vitals Blood pressure 101/68, pulse (!) 106, temperature 97.8 F (36.6 C), resp. rate 17, height 5\' 10"  (1.778 m), weight 93 kg, SpO2 94 %.  Filed Weights   09/11/20 0114  Weight: 93 kg    GEN: Well nourished, well developed, in no acute distress HEENT: normal Neck: no JVD or masses Cardiac: RR, mildly tachy; no murmurs, rubs, or gallops,no edema  Respiratory:  clear to auscultation bilaterally, normal work of breathing GI: soft, nontender, nondistended, + BS MS: no deformity or atrophy Skin: warm and dry, no rash. Pacer site bandaged with no hematoma. Arm in sling Neuro:  Alert and Oriented x 3, Strength and sensation are intact Psych: euthymic mood, full affect   Labs & Radiologic Studies    CBC Recent Labs    09/11/20 0050 09/11/20 0104  WBC 10.3  --   NEUTROABS 4.8  --   HGB 12.4* 11.9*  HCT 37.5* 35.0*  MCV 95.7  --   PLT 184  --  Basic Metabolic Panel Recent Labs    09/11/20 0050 09/11/20 0104 09/11/20 0237 09/12/20 0148 09/13/20 0043  NA 132*   < >  --  136 138  K 5.7*   < > 4.5 5.1 4.1  CL 103   < >  --  106 108  CO2 22  --   --  26 25  GLUCOSE 121*   < >  --  112* 119*  BUN 20   < >  --  17 19  CREATININE 1.46*   < >  --  1.63* 1.60*  CALCIUM 8.6*  --   --  9.3 9.0  MG 2.3  --  2.2  --   --    < > = values in this interval not displayed.   Liver Function Tests Recent Labs    09/11/20 0050  AST 45*  ALT 13  ALKPHOS 52  BILITOT 0.9  PROT 5.2*  ALBUMIN 3.2*   No results for input(s): LIPASE, AMYLASE in the last 72 hours. Cardiac Enzymes No results for input(s): CKTOTAL, CKMB, CKMBINDEX, TROPONINI in the last 72 hours. BNP Invalid input(s): POCBNP D-Dimer No results for input(s): DDIMER in the last 72 hours. Hemoglobin  A1C No results for input(s): HGBA1C in the last 72 hours. Fasting Lipid Panel No results for input(s): CHOL, HDL, LDLCALC, TRIG, CHOLHDL, LDLDIRECT in the last 72 hours. Thyroid Function Tests Recent Labs    09/11/20 0237  TSH 1.713   _____________  DG Chest 2 View  Result Date: 09/13/2020 CLINICAL DATA:  Evaluate pacemaker placement. EXAM: CHEST - 2 VIEW COMPARISON:  09/11/2020. FINDINGS: Left anterior chest wall sequential pacemaker has its leads projecting in the right atrium and right ventricle on the frontal view, lead tips not well-defined on the lateral view. No pneumothorax. Heart, mediastinum and hila are unremarkable.  Lungs are clear. Skeletal structures are intact. IMPRESSION: 1. Left anterior chest wall pacemaker leads appear well positioned, only well-defined on the AP view. 2. No pneumothorax.  No active cardiopulmonary disease. Electronically Signed   By: Lajean Manes M.D.   On: 09/13/2020 10:45   MR BRAIN W WO CONTRAST  Result Date: 09/11/2020 CLINICAL DATA:  Metastatic melanoma. EXAM: MRI HEAD WITHOUT AND WITH CONTRAST TECHNIQUE: Multiplanar, multiecho pulse sequences of the brain and surrounding structures were obtained without and with intravenous contrast. CONTRAST:  37mL GADAVIST GADOBUTROL 1 MMOL/ML IV SOLN COMPARISON:  08/08/2020. FINDINGS: Brain: A hemorrhagic mass in the right frontal lobe measures 3.5 x 4.1 x 5.1 cm, increased in size from the prior study. The mass is largely intrinsically T1 hyperintense which limits assessment for enhancement, although there is at least mild peripheral enhancement. There is a more nodular appearance of the anterior portion of the mass, and there is mild surrounding edema with regional sulcal effacement, mild mass effect on the frontal horn of the right lateral ventricle, and 3 mm of leftward midline shift. Additionally, there are numerous new predominantly punctate nodular foci of intrinsic T1 hyperintensity along the surface of both  cerebral hemispheres, particularly prominent in the high frontal regions. These appear to be at least partially intraparenchymal with superficial cortical involvement, however a component of leptomeningeal disease is also possible. There is also a new subcentimeter T1 hyperintense lesion involving the pineal gland. No acute infarct or extra-axial fluid collection is identified. Cerebral volume is within normal limits for age. Vascular: Major intracranial vascular flow voids are preserved. Skull and upper cervical spine: Unremarkable bone marrow signal. Sinuses/Orbits: Increased size  of a 6 mm T1 hyperintense nodule anteriorly in the left globe inferior to the lens. New 5 mm T1 hyperintense mass anteriorly in the right globe medial and superior to the lens. Paranasal sinuses and mastoid air cells are clear. Other: None. IMPRESSION: 1. Increased size of right frontal hemorrhagic mass with mild edema and 3 mm of leftward midline shift. 2. Numerous new punctate lesions along the surface of the brain consistent with metastases. Some of these appear to be located superficially within cerebral cortex, however a component of leptomeningeal disease is also possible. 3. New lesions involving the pineal gland and both ocular globes consistent with metastases. Electronically Signed   By: Logan Bores M.D.   On: 09/11/2020 17:33   DG Chest Portable 1 View  Result Date: 09/11/2020 CLINICAL DATA:  Initial evaluation for bradycardia. EXAM: PORTABLE CHEST 1 VIEW COMPARISON:  Prior radiograph from 08/08/2019. FINDINGS: Transverse heart size within normal limits. Mediastinal silhouette normal. Lungs hypoinflated. Different related pads overlie the left chest. No focal infiltrates. No edema or effusion. No pneumothorax. No acute osseous finding. IMPRESSION: No radiographic evidence for active cardiopulmonary disease. Electronically Signed   By: Jeannine Boga M.D.   On: 09/11/2020 01:13   ECHOCARDIOGRAM COMPLETE  Result  Date: 09/11/2020    ECHOCARDIOGRAM REPORT   Patient Name:   AMANUEL SINKFIELD Warriner Date of Exam: 09/11/2020 Medical Rec #:  [id number]        Height:       70.0 in Accession #:    [id number]       Weight:       205.0 lb Date of Birth:  1955/09/14        BSA:          2.109 m Patient Age:    72 years         BP:           160/66 mmHg Patient Gender: M                HR:           39 bpm. Exam Location:  Inpatient Procedure: 2D Echo, Cardiac Doppler, Color Doppler and Intracardiac            Opacification Agent Indications:    Heart block  History:        Patient has prior history of Echocardiogram examinations, most                 recent 06/22/2020. CHF, Arrythmias:LBBB; Risk Factors:Hypertension                 and Dyslipidemia.  Sonographer:    Luisa Hart RDCS Referring Phys: Joyce Comments: Image acquisition challenging due to patient body habitus. IMPRESSIONS  1. Complete heart block noted with junctional escape.  2. Left ventricular ejection fraction, by estimation, is 60 to 65%. The left ventricle has normal function. The left ventricle has no regional wall motion abnormalities. There is mild asymmetric left ventricular hypertrophy of the basal-septal segment. Left ventricular diastolic parameters are indeterminate.  3. Right ventricular systolic function is normal. The right ventricular size is normal. Tricuspid regurgitation signal is inadequate for assessing PA pressure.  4. The mitral valve is grossly normal. No evidence of mitral valve regurgitation. No evidence of mitral stenosis.  5. The aortic valve is tricuspid. Aortic valve regurgitation is not visualized. No aortic stenosis is present.  6. The inferior vena cava is normal in size with greater than  50% respiratory variability, suggesting right atrial pressure of 3 mmHg. Comparison(s): Changes from prior study are noted. Complete heart block is now present. EF remains unchanged. FINDINGS  Left Ventricle: Left ventricular ejection  fraction, by estimation, is 60 to 65%. The left ventricle has normal function. The left ventricle has no regional wall motion abnormalities. Definity contrast agent was given IV to delineate the left ventricular  endocardial borders. The left ventricular internal cavity size was normal in size. There is mild asymmetric left ventricular hypertrophy of the basal-septal segment. Abnormal (paradoxical) septal motion, consistent with left bundle branch block. Left ventricular diastolic parameters are indeterminate. Right Ventricle: The right ventricular size is normal. No increase in right ventricular wall thickness. Right ventricular systolic function is normal. Tricuspid regurgitation signal is inadequate for assessing PA pressure. Left Atrium: Left atrial size was normal in size. Right Atrium: Right atrial size was normal in size. Pericardium: There is no evidence of pericardial effusion. Mitral Valve: The mitral valve is grossly normal. No evidence of mitral valve regurgitation. No evidence of mitral valve stenosis. MV peak gradient, 4.5 mmHg. The mean mitral valve gradient is 1.0 mmHg. Tricuspid Valve: The tricuspid valve is grossly normal. Tricuspid valve regurgitation is not demonstrated. No evidence of tricuspid stenosis. Aortic Valve: The aortic valve is tricuspid. Aortic valve regurgitation is not visualized. No aortic stenosis is present. Aortic valve mean gradient measures 4.0 mmHg. Aortic valve peak gradient measures 10.5 mmHg. Aortic valve area, by VTI measures 3.09  cm. Pulmonic Valve: The pulmonic valve was grossly normal. Pulmonic valve regurgitation is not visualized. No evidence of pulmonic stenosis. Aorta: The aortic root and ascending aorta are structurally normal, with no evidence of dilitation. Venous: The inferior vena cava is normal in size with greater than 50% respiratory variability, suggesting right atrial pressure of 3 mmHg. IAS/Shunts: The atrial septum is grossly normal.  LEFT VENTRICLE  PLAX 2D LVIDd:         3.90 cm  Diastology LVIDs:         3.10 cm  LV e' medial:    10.10 cm/s LV PW:         1.20 cm  LV E/e' medial:  9.2 LV IVS:        1.40 cm  LV e' lateral:   9.36 cm/s LVOT diam:     1.90 cm  LV E/e' lateral: 9.9 LV SV:         94 LV SV Index:   45 LVOT Area:     2.84 cm  RIGHT VENTRICLE RV Basal diam:  3.20 cm RV Mid diam:    1.80 cm RV S prime:     10.20 cm/s TAPSE (M-mode): 1.3 cm LEFT ATRIUM           Index       RIGHT ATRIUM           Index LA Vol (A2C): 29.8 ml 14.13 ml/m RA Area:     10.10 cm LA Vol (A4C): 80.3 ml 38.07 ml/m RA Volume:   17.90 ml  8.49 ml/m  AORTIC VALVE                   PULMONIC VALVE AV Area (Vmax):    2.78 cm    PV Vmax:       1.45 m/s AV Area (Vmean):   2.71 cm    PV Vmean:      84.200 cm/s AV Area (VTI):     3.09 cm  PV VTI:        0.304 m AV Vmax:           162.00 cm/s PV Peak grad:  8.4 mmHg AV Vmean:          89.600 cm/s PV Mean grad:  4.0 mmHg AV VTI:            0.305 m AV Peak Grad:      10.5 mmHg AV Mean Grad:      4.0 mmHg LVOT Vmax:         159.00 cm/s LVOT Vmean:        85.700 cm/s LVOT VTI:          0.332 m LVOT/AV VTI ratio: 1.09  AORTA Ao Root diam: 3.70 cm Ao Asc diam:  3.10 cm MITRAL VALVE MV Area VTI:  2.31 cm     SHUNTS MV Peak grad: 4.5 mmHg     Systemic VTI:  0.33 m MV Mean grad: 1.0 mmHg     Systemic Diam: 1.90 cm MV Vmax:      1.06 m/s MV Vmean:     51.9 cm/s MV E velocity: 92.50 cm/s Eleonore Chiquito MD Electronically signed by Eleonore Chiquito MD Signature Date/Time: 09/11/2020/2:54:15 PM    Final    Disposition   Pt is being discharged home today in good condition.  Follow-up Plans & Appointments     Follow-up Information    [facility].   Specialty: Cardiology Why: 09/23/20 @ 9:20AM, wound check visit (pacemaker) Contact information: [address] [place]       Vickie Epley, MD Follow up.   Specialties: Cardiology,  Radiology Why: 12/16/20 @ 3:30PM Contact information: [place] [address] [phone]                Discharge Medications   Allergies as of 09/13/2020   No Known Allergies     Medication List    STOP taking these medications   carvedilol 6.25 MG tablet Commonly known as: COREG   sacubitril-valsartan 24-26 MG Commonly known as: ENTRESTO   spironolactone 25 MG tablet Commonly known as: Aldactone     TAKE these medications   albuterol 108 (90 Base) MCG/ACT inhaler Commonly known as: VENTOLIN HFA Inhale 2 puffs into the lungs every 4 (four) hours as needed for wheezing or shortness of breath.   atropine 1 % ophthalmic solution Place 1 drop into the left eye in the morning and at bedtime.   colchicine 0.6 MG tablet Take 0.6 mg by mouth daily as needed (gout flares).   furosemide 20 MG tablet Commonly known as: LASIX Take 1 tablet (20 mg total) by mouth daily as needed. What changed:   when to take this  reasons to take this   neomycin-polymyxin-dexamethasone 0.1 % ophthalmic suspension Commonly known as: MAXITROL Place 1 drop into the left eye in the morning, at noon, and at bedtime.   OPDIVO IV Inject into the vein. IV infusion every 4 weeks.   prednisoLONE acetate 1 % ophthalmic suspension Commonly known as: PRED FORTE Place 1 drop into the left eye daily.         Outstanding Labs/Studies   BMET  Duration of Discharge Encounter   Greater than 30 minutes including physician time.  Signed, Angelena Form, PA-C 09/13/2020, 11:35 AM [phone] As above; pt seen and examined; no CP or dyspnea; Pacer site with no  hematoma and no evidence of infection; SBP remains high 80s this AM and most recent echo shows recovery of LV function; DC entresto, coreg and spironolactone and follow BP; can reinstitute lower dose meds as outpt if needed. Angelena Form

## 2020-09-16 ENCOUNTER — Encounter (HOSPITAL_COMMUNITY): Payer: Self-pay | Admitting: Cardiology

## 2020-09-16 MED FILL — Lidocaine HCl Local Inj 1%: INTRAMUSCULAR | Qty: 60 | Status: AC

## 2020-09-16 MED FILL — Gentamicin Sulfate Inj 40 MG/ML: INTRAMUSCULAR | Qty: 80 | Status: AC

## 2020-09-16 MED FILL — Heparin Sod (Porcine)-NaCl IV Soln 1000 Unit/500ML-0.9%: INTRAVENOUS | Qty: 500 | Status: AC

## 2020-09-19 ENCOUNTER — Telehealth: Payer: Self-pay

## 2020-09-19 NOTE — Telephone Encounter (Signed)
Dr Vallarie Mare from [facility] called because the patient was there and his cancer has spread to his brain. They wanted to do a MRI and wanted to know how soon after implant can he have a MRI? I asked the device nurse Amy, rn who confirmed with the [employer] rep Ronalee Belts that there is no restrictions anymore. The patient can have the MRI.

## 2020-09-23 ENCOUNTER — Ambulatory Visit (INDEPENDENT_AMBULATORY_CARE_PROVIDER_SITE_OTHER): Payer: Managed Care, Other (non HMO) | Admitting: Emergency Medicine

## 2020-09-23 ENCOUNTER — Other Ambulatory Visit: Payer: Self-pay

## 2020-09-23 DIAGNOSIS — I442 Atrioventricular block, complete: Secondary | ICD-10-CM

## 2020-09-23 LAB — CUP PACEART INCLINIC DEVICE CHECK
Battery Remaining Longevity: 73 mo
Battery Voltage: 3.2 V
Brady Statistic AP VP Percent: 0.07 %
Brady Statistic AP VS Percent: 0 %
Brady Statistic AS VP Percent: 99.8 %
Brady Statistic AS VS Percent: 0.12 %
Brady Statistic RA Percent Paced: 0.08 %
Brady Statistic RV Percent Paced: 99.87 %
Date Time Interrogation Session: 20220607100211
Implantable Lead Implant Date: 20220527
Implantable Lead Implant Date: 20220527
Implantable Lead Location: 753859
Implantable Lead Location: 753860
Implantable Lead Model: 3830
Implantable Lead Model: 5076
Implantable Pulse Generator Implant Date: 20220527
Lead Channel Impedance Value: 304 Ohm
Lead Channel Impedance Value: 361 Ohm
Lead Channel Impedance Value: 418 Ohm
Lead Channel Impedance Value: 551 Ohm
Lead Channel Pacing Threshold Amplitude: 0.75 V
Lead Channel Pacing Threshold Amplitude: 1 V
Lead Channel Pacing Threshold Pulse Width: 0.4 ms
Lead Channel Pacing Threshold Pulse Width: 0.4 ms
Lead Channel Sensing Intrinsic Amplitude: 1 mV
Lead Channel Setting Pacing Amplitude: 3.5 V
Lead Channel Setting Pacing Amplitude: 5 V
Lead Channel Setting Pacing Pulse Width: 0.4 ms
Lead Channel Setting Sensing Sensitivity: 0.9 mV

## 2020-09-23 NOTE — Progress Notes (Signed)
Wound check appointment. Steri-strips removed. Wound without redness or edema. Incision edges approximated, wound well healed. Normal device function. Thresholds, sensing, and impedances consistent with implant measurements. Device programmed at RA 3.5V/ RV 5.0Von for extra safety margin until 3 month visit. Histogram distribution appropriate for patient and level of activity. No mode switches or high ventricular rates noted. Patient educated about wound care, arm mobility, lifting restrictions. ROV with Dr. Quentin Ore on 12/16/20.  Assisted patient with Remote monitoring enrollment via App in his phone, next scheduled remote transmission is 12/19/20.

## 2020-10-14 ENCOUNTER — Telehealth: Payer: Self-pay | Admitting: Cardiovascular Disease

## 2020-10-14 NOTE — Telephone Encounter (Signed)
*  STAT* If patient is at the pharmacy, call can be transferred to refill team.   1. Which medications need to be refilled? (please list name of each medication and dose if known) furosemide (LASIX) 20 MG tablet (Expired)  2. Which pharmacy/location (including street and city if local pharmacy) is medication to be sent to? [place], [place] - [address]  3. Do they need a 30 day or 90 day supply? [place]

## 2020-10-24 ENCOUNTER — Telehealth: Payer: Self-pay | Admitting: Cardiology

## 2020-10-24 NOTE — Telephone Encounter (Signed)
  1. Has your device fired? no  2. Is you device beeping? no  3. Are you experiencing draining or swelling at device site? no  4. Are you calling to see if we received your device transmission? no  5. Have you passed out? no  Dr. Lafayette Dragon calling to for information o nt the patient's pacemaker. She states he is supposed to have radiation today and it is urgent. Phone: [phone]    Please route to [place]

## 2020-10-24 NOTE — Telephone Encounter (Signed)
Successful telephone encounter with Dr. Lafayette Dragon, Radiation Oncologist with [facility] information provided including model and serial number. All other information previously provided in note. Dr. Lafayette Dragon appreciative of information.

## 2020-10-24 NOTE — Telephone Encounter (Signed)
Attempted to call Dr. Lafayette Dragon back at [phone] to discuss patient's radiation treatment as it relates to his device however "mailbox is full and can no longer accept messages"

## 2020-10-24 NOTE — Telephone Encounter (Signed)
Third attempt to contact Dr. Lafayette Dragon to discuss patient's device as it relates to radiation treatment for brain mets. No answer and mailbox cannot receive messages.   Spoke to [employer], [employer] rep, who states radiation to brain will not interfere with pacer. Patient has [employer] Azure Dual Chamber PPM implanted 09/12/20 for CHB, is pacer dependent with last in-clinic check 09/23/20 (AS@103 /VP@30 ). Programmed DDD 60-130 with acute lead outputs at 3.0/5.0. Thresholds WNL. Patient will need to be checked post completed radiation sequence and NOT after each individual treatment. Does NOT need magnet therapy during treatment.  Please contact [employer] Rep to interrogate at last treatment.[phone]

## 2020-11-20 ENCOUNTER — Telehealth: Payer: Self-pay | Admitting: *Deleted

## 2020-11-20 NOTE — Telephone Encounter (Signed)
Received a call from [facility] @ [facility] informing me that she had to forward patient's CPAP order to [place]. They do not take his insurance and he does not have out of network benefits.

## 2020-12-16 ENCOUNTER — Encounter: Payer: Self-pay | Admitting: Cardiology

## 2020-12-16 ENCOUNTER — Ambulatory Visit: Payer: Managed Care, Other (non HMO) | Admitting: Cardiology

## 2020-12-16 ENCOUNTER — Other Ambulatory Visit: Payer: Self-pay

## 2020-12-16 VITALS — BP 100/70 | HR 95 | Ht 70.0 in | Wt 182.0 lb

## 2020-12-16 DIAGNOSIS — I442 Atrioventricular block, complete: Secondary | ICD-10-CM | POA: Diagnosis not present

## 2020-12-16 DIAGNOSIS — C799 Secondary malignant neoplasm of unspecified site: Secondary | ICD-10-CM | POA: Diagnosis not present

## 2020-12-16 DIAGNOSIS — C439 Malignant melanoma of skin, unspecified: Secondary | ICD-10-CM

## 2020-12-16 NOTE — Progress Notes (Signed)
Electrophysiology Office Follow up Visit Note:    Date:  12/16/2020   ID:  Logan Harrison, DOB [birth year], MRN [id number]  PCP:  P[facility], [place] Cardiologist:  Evalina Field, MD  [facility] Electrophysiologist:  Vickie Epley, MD    Interval History:    Logan Harrison is a 65 y.o. male who presents for a follow up visit after his hospitalization in May 2022 for complete heart block.  He has a history of metastatic melanoma for which she is receiving treatment at [facility].  He underwent a successful permanent pacemaker implant on Sep 12, 2020.  The incision is healed well.  He has done well since implant.  He has some upcoming MRIs scheduled.  He is in clinic today with his wife who I have previously met.     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 Quentin Ore    Past Surgical History:  Procedure Laterality Date   APPENDECTOMY     CHOLECYSTECTOMY     PACEMAKER IMPLANT N/A 09/12/2020   Procedure: PACEMAKER IMPLANT;  Surgeon: Vickie Epley, MD;  Location: [place] [facility];  Service: Cardiovascular;  Laterality: N/A;   PROSTATECTOMY      Current Medications: Current Meds  Medication Sig   albuterol (VENTOLIN HFA) 108 (90 Base) MCG/ACT inhaler Inhale 2 puffs into the lungs every 4 (four) hours as needed for wheezing or shortness of breath.   atropine 1 % ophthalmic solution Place 1 drop into the left eye in the morning and at bedtime.   colchicine 0.6 MG tablet Take 0.6 mg by mouth daily as needed (gout flares).   neomycin-polymyxin-dexamethasone (MAXITROL) 0.1 % ophthalmic suspension Place 1 drop into  the left eye in the morning, at noon, and at bedtime.   Nivolumab (OPDIVO IV) Inject into the vein. IV infusion every 4 weeks.   prednisoLONE acetate (PRED FORTE) 1 % ophthalmic suspension Place 1 drop into the left eye daily.     Allergies:   Patient has no known allergies.   Social History   Socioeconomic History   Marital status: Divorced    Spouse name: Not on file   Number of children: Not on file   Years of education: Not on file   Highest education level: Not on file  Occupational History   Not on file  Tobacco Use   Smoking status: Never   Smokeless tobacco: Never  Vaping Use   Vaping Use: Never used  Substance and Sexual Activity   Alcohol use: Yes    Comment: occ   Drug use: Never   Sexual activity: Not on file  Other Topics Concern   Not on file  Social History Narrative   Not on file   Social Determinants of Health   Financial Resource Strain: Not on file  Food Insecurity: Not on file  Transportation Needs: Not on file  Physical Activity: Not on file  Stress: Not on file  Social Connections: Not on file     Family History: The patient's family history includes Cancer in his father; Diabetes in  his mother; Heart disease in his father; Hypertension in his mother.  ROS:   Please see the history of present illness.    All other systems reviewed and are negative.  EKGs/Labs/Other Studies Reviewed:    The following studies were reviewed today:   December 16, 2020 in clinic device interrogation personally reviewed Lead parameters are stable Battery longevity 11.4 years Ventricular pacing 100% Atrially pacing less than 0.1% Underlying rhythm atrial sensing without underlying ventricular rhythm   EKG:  The ekg ordered today demonstrates sinus rhythm, first-degree AV delay, ventricular pacing  Recent Labs: 09/11/2020: ALT 13; Hemoglobin 11.9; Magnesium 2.2; Platelets 184; TSH 1.713 09/13/2020: BUN 19; Creatinine, Ser 1.60; Potassium 4.1; Sodium 138   Recent Lipid Panel    Component Value Date/Time   CHOL 199 06/22/2020 0255   TRIG 153 (H) 06/22/2020 0255   HDL 32 (L) 06/22/2020 0255   CHOLHDL 6.2 06/22/2020 0255   VLDL 31 06/22/2020 0255   LDLCALC 136 (H) 06/22/2020 0255    Physical Exam:    VS:  BP 100/70   Pulse 95   Ht $R'5\' 10"'CT$  (1.778 m)   Wt 182 lb (82.6 kg)   BMI 26.11 kg/m     Wt Readings from Last 3 Encounters:  12/16/20 182 lb (82.6 kg)  09/11/20 205 lb (93 kg)  08/21/20 205 lb 12.8 oz (93.4 kg)     GEN: Chronically ill-appearing, no acute distress  HEENT: Normal NECK: No JVD; No carotid bruits LYMPHATICS: No lymphadenopathy CARDIAC: RRR, no murmurs, rubs, gallops.  Pacemaker pocket well-healed RESPIRATORY:  Clear to auscultation without rales, wheezing or rhonchi  ABDOMEN: Soft, non-tender, non-distended MUSCULOSKELETAL:  No edema; No deformity  SKIN: Warm and dry NEUROLOGIC:  Alert and oriented x 3 PSYCHIATRIC:  Normal affect   ASSESSMENT:    1. Complete heart block ([place])   2. Metastatic melanoma ([place])    PLAN:    In order of problems listed above:  1. Complete heart block [facility]) Doing well after permanent pacemaker implant.  Pacemaker interrogation shows a well-functioning device today.  Continue remote interrogations.  2. Metastatic melanoma [facility]) Patient receiving care at [facility].   Follow-up in clinic in 9 months or sooner as needed.    Medication Adjustments/Labs and Tests Ordered: Current medicines are reviewed at length with the patient today.  Concerns regarding medicines are outlined above.  Orders Placed This Encounter  Procedures   EKG 12-Lead   No orders of the defined types were placed in this encounter.    Signed, Lars Mage, MD, [facility], [facility] 12/16/2020 10:44 PM    Electrophysiology [facility]

## 2020-12-16 NOTE — Patient Instructions (Addendum)
Medication Instructions:  Your physician recommends that you continue on your current medications as directed. Please refer to the Current Medication list given to you today.  Labwork: None ordered.  Testing/Procedures: None ordered.  Follow-Up: Your physician wants you to follow-up in: 9 months with  Lars Mage, MD   You will receive a reminder letter in the mail two months in advance. If you don't receive a letter, please call our office to schedule the follow-up appointment.  Remote monitoring is used to monitor your Pacemaker from home. This monitoring reduces the number of office visits required to check your device to one time per year. It allows [place] to keep an eye on the functioning of your device to ensure it is working properly. You are scheduled for a device check from home on 12/19/20. You may send your transmission at any time that day. If you have a wireless device, the transmission will be sent automatically. After your physician reviews your transmission, you will receive a postcard with your next transmission date.  Any Other Special Instructions Will Be Listed Below (If Applicable).  If you need a refill on your cardiac medications before your next appointment, please call your pharmacy.

## 2020-12-19 ENCOUNTER — Ambulatory Visit (INDEPENDENT_AMBULATORY_CARE_PROVIDER_SITE_OTHER): Payer: Managed Care, Other (non HMO)

## 2020-12-19 DIAGNOSIS — I442 Atrioventricular block, complete: Secondary | ICD-10-CM

## 2020-12-23 LAB — CUP PACEART REMOTE DEVICE CHECK
Battery Remaining Longevity: 136 mo
Battery Voltage: 3.14 V
Brady Statistic AP VP Percent: 0 %
Brady Statistic AP VS Percent: 0 %
Brady Statistic AS VP Percent: 99.99 %
Brady Statistic AS VS Percent: 0 %
Brady Statistic RA Percent Paced: 0 %
Brady Statistic RV Percent Paced: 100 %
Date Time Interrogation Session: 20220901225433
Implantable Lead Implant Date: 20220527
Implantable Lead Implant Date: 20220527
Implantable Lead Location: 753859
Implantable Lead Location: 753860
Implantable Lead Model: 3830
Implantable Lead Model: 5076
Implantable Pulse Generator Implant Date: 20220527
Lead Channel Impedance Value: 304 Ohm
Lead Channel Impedance Value: 323 Ohm
Lead Channel Impedance Value: 589 Ohm
Lead Channel Impedance Value: 608 Ohm
Lead Channel Pacing Threshold Amplitude: 1 V
Lead Channel Pacing Threshold Amplitude: 1.125 V
Lead Channel Pacing Threshold Pulse Width: 0.4 ms
Lead Channel Pacing Threshold Pulse Width: 0.4 ms
Lead Channel Sensing Intrinsic Amplitude: 2.5 mV
Lead Channel Sensing Intrinsic Amplitude: 2.5 mV
Lead Channel Sensing Intrinsic Amplitude: 20.875 mV
Lead Channel Sensing Intrinsic Amplitude: 8.625 mV
Lead Channel Setting Pacing Amplitude: 2.25 V
Lead Channel Setting Pacing Amplitude: 3 V
Lead Channel Setting Pacing Pulse Width: 0.4 ms
Lead Channel Setting Sensing Sensitivity: 0.9 mV

## 2020-12-30 NOTE — Progress Notes (Signed)
Remote pacemaker transmission.   

## 2021-02-09 ENCOUNTER — Other Ambulatory Visit: Payer: Self-pay | Admitting: Cardiology

## 2021-03-19 DEATH — deceased

## 2021-12-18 ENCOUNTER — Ambulatory Visit (INDEPENDENT_AMBULATORY_CARE_PROVIDER_SITE_OTHER): Payer: Managed Care, Other (non HMO)

## 2021-12-18 DIAGNOSIS — I442 Atrioventricular block, complete: Secondary | ICD-10-CM

## 2022-01-07 NOTE — Progress Notes (Signed)
Remote pacemaker transmission.   

## 2022-12-17 ENCOUNTER — Ambulatory Visit (INDEPENDENT_AMBULATORY_CARE_PROVIDER_SITE_OTHER): Payer: Self-pay

## 2022-12-17 DIAGNOSIS — I442 Atrioventricular block, complete: Secondary | ICD-10-CM

## 2022-12-21 NOTE — Progress Notes (Signed)
Remote pacemaker transmission.
